# Patient Record
Sex: Female | Born: 1995
Health system: Southern US, Community
[De-identification: ages and names within clinical notes are randomized; demographics above are authoritative.]

## PROBLEM LIST (undated history)

## (undated) DIAGNOSIS — K5909 Other constipation: Secondary | ICD-10-CM

## (undated) DIAGNOSIS — G43909 Migraine, unspecified, not intractable, without status migrainosus: Secondary | ICD-10-CM

## (undated) DIAGNOSIS — R002 Palpitations: Secondary | ICD-10-CM

## (undated) DIAGNOSIS — K219 Gastro-esophageal reflux disease without esophagitis: Secondary | ICD-10-CM

## (undated) DIAGNOSIS — E282 Polycystic ovarian syndrome: Secondary | ICD-10-CM

## (undated) DIAGNOSIS — Z8619 Personal history of other infectious and parasitic diseases: Secondary | ICD-10-CM

## (undated) DIAGNOSIS — Z8249 Family history of ischemic heart disease and other diseases of the circulatory system: Secondary | ICD-10-CM

## (undated) DIAGNOSIS — K644 Residual hemorrhoidal skin tags: Secondary | ICD-10-CM

## (undated) DIAGNOSIS — K601 Chronic anal fissure: Secondary | ICD-10-CM

## (undated) DIAGNOSIS — L680 Hirsutism: Secondary | ICD-10-CM

## (undated) DIAGNOSIS — I959 Hypotension, unspecified: Secondary | ICD-10-CM

## (undated) DIAGNOSIS — Z8489 Family history of other specified conditions: Secondary | ICD-10-CM

## (undated) HISTORY — DX: Family history of ischemic heart disease and other diseases of the circulatory system: Z82.49

## (undated) HISTORY — DX: Other constipation: K59.09

## (undated) HISTORY — DX: Hypotension, unspecified: I95.9

## (undated) HISTORY — DX: Personal history of other infectious and parasitic diseases: Z86.19

## (undated) HISTORY — DX: Polycystic ovarian syndrome: E28.2

## (undated) HISTORY — PX: TONSILLECTOMY AND ADENOIDECTOMY: SHX28

## (undated) HISTORY — DX: Family history of other specified conditions: Z84.89

## (undated) HISTORY — PX: WISDOM TOOTH EXTRACTION: SHX21

## (undated) HISTORY — DX: Chronic anal fissure: K60.1

## (undated) HISTORY — DX: Palpitations: R00.2

## (undated) HISTORY — DX: Migraine, unspecified, not intractable, without status migrainosus: G43.909

## (undated) HISTORY — DX: Hirsutism: L68.0

## (undated) HISTORY — DX: Gastro-esophageal reflux disease without esophagitis: K21.9

## (undated) HISTORY — DX: Residual hemorrhoidal skin tags: K64.4

---

## 2015-01-16 ENCOUNTER — Emergency Department (HOSPITAL_COMMUNITY)
Admission: EM | Admit: 2015-01-16 | Discharge: 2015-01-16 | Disposition: A | Discharge status: Home / Self Care | Payer: Self-pay | Attending: Physician Assistant | Admitting: Physician Assistant

## 2015-01-16 ENCOUNTER — Emergency Department (HOSPITAL_COMMUNITY): Payer: Self-pay

## 2015-01-16 DIAGNOSIS — R110 Nausea: Secondary | ICD-10-CM | POA: Insufficient documentation

## 2015-01-16 DIAGNOSIS — R55 Syncope and collapse: Secondary | ICD-10-CM | POA: Insufficient documentation

## 2015-01-16 DIAGNOSIS — Z3202 Encounter for pregnancy test, result negative: Secondary | ICD-10-CM | POA: Insufficient documentation

## 2015-01-16 DIAGNOSIS — R002 Palpitations: Secondary | ICD-10-CM | POA: Insufficient documentation

## 2015-01-16 DIAGNOSIS — R0789 Other chest pain: Secondary | ICD-10-CM | POA: Insufficient documentation

## 2015-01-16 LAB — COMPREHENSIVE METABOLIC PANEL
ALBUMIN: 4 g/dL (ref 3.5–5.0)
ALK PHOS: 71 U/L (ref 38–126)
ALT: 13 U/L — ABNORMAL LOW (ref 14–54)
ANION GAP: 5 (ref 5–15)
AST: 20 U/L (ref 15–41)
BILIRUBIN TOTAL: 0.7 mg/dL (ref 0.3–1.2)
BUN: 9 mg/dL (ref 6–20)
CO2: 24 mmol/L (ref 22–32)
Calcium: 9.1 mg/dL (ref 8.9–10.3)
Chloride: 107 mmol/L (ref 101–111)
Creatinine, Ser: 0.8 mg/dL (ref 0.44–1.00)
GFR calc Af Amer: >60 mL/min (ref >60)
GFR calc non Af Amer: >60 mL/min (ref >60)
GLUCOSE: 94 mg/dL (ref 65–99)
POTASSIUM: 3.7 mmol/L (ref 3.5–5.1)
SODIUM: 136 mmol/L (ref 135–145)
Total Protein: 7 g/dL (ref 6.5–8.1)

## 2015-01-16 LAB — CBC WITH DIFFERENTIAL/PLATELET
BASOS ABS: 0 10*3/uL (ref 0.0–0.1)
BASOS PCT: 0 %
EOS ABS: 0 10*3/uL (ref 0.0–0.7)
Eosinophils Relative: 0 %
HEMATOCRIT: 36.8 % (ref 36.0–46.0)
HEMOGLOBIN: 12.5 g/dL (ref 12.0–15.0)
Lymphocytes Relative: 23 %
Lymphs Abs: 2 10*3/uL (ref 0.7–4.0)
MCH: 31.4 pg (ref 26.0–34.0)
MCHC: 34 g/dL (ref 30.0–36.0)
MCV: 92.5 fL (ref 78.0–100.0)
MONOS PCT: 7 %
Monocytes Absolute: 0.6 10*3/uL (ref 0.1–1.0)
NEUTROS ABS: 6.2 10*3/uL (ref 1.7–7.7)
NEUTROS PCT: 70 %
Platelets: 312 10*3/uL (ref 150–400)
RBC: 3.98 MIL/uL (ref 3.87–5.11)
RDW: 12.2 % (ref 11.5–15.5)
WBC: 8.8 10*3/uL (ref 4.0–10.5)

## 2015-01-16 LAB — URINALYSIS, ROUTINE W REFLEX MICROSCOPIC
Bilirubin Urine: NEGATIVE
GLUCOSE, UA: NEGATIVE mg/dL
Hgb urine dipstick: NEGATIVE
Ketones, ur: NEGATIVE mg/dL
LEUKOCYTES UA: NEGATIVE
NITRITE: NEGATIVE
PH: 8 (ref 5.0–8.0)
Protein, ur: NEGATIVE mg/dL
SPECIFIC GRAVITY, URINE: 1.015 (ref 1.005–1.030)

## 2015-01-16 LAB — HCG, SERUM, QUALITATIVE: Preg, Serum: NEGATIVE

## 2015-01-16 MED ORDER — SODIUM CHLORIDE 0.9 % IV BOLUS (SEPSIS)
1000.0000 mL | Freq: Once | INTRAVENOUS | Status: AC
  Administered 2015-01-16: 1000 mL via INTRAVENOUS

## 2015-01-16 NOTE — ED Provider Notes (Signed)
CSN: [id number]     Arrival date & time 01/16/15  1419 History   First MD Initiated Contact with Patient 01/16/15 1508     Chief Complaint  Patient presents with  . Dizziness     (Consider location/radiation/quality/duration/timing/severity/associated sxs/prior Treatment) HPI   Patient is a 19 year old female who was giving blood today. Afterwards she stood up and walked on the stairs at which point she felt lightheaded, dizzy, chest pain, shortness of breath, sweating, nausea. Patient reports that she had numbness and tingling in all 4 limbs. She also had shaking of her right arm. She felt faint. EMS took her blood pressure and reportedly it was 90/45. Patient felt better when she was in the rescue.  Patient has no past medical history. However she's. Anxious and concerned because her father and mother both had heart attacks in their 50s.  No past medical history on file. No past surgical history on file. No family history on file. Social History  Substance Use Topics  . Smoking status: Not on file  . Smokeless tobacco: Not on file  . Alcohol Use: Not on file   OB History    No data available     Review of Systems  Constitutional: Negative for activity change and fatigue.  Respiratory: Positive for chest tightness. Negative for cough.   Cardiovascular: Positive for chest pain and palpitations.  Gastrointestinal: Positive for nausea. Negative for abdominal distention.  Genitourinary: Negative for dysuria.  Skin: Negative for rash.  Allergic/Immunologic: Negative for immunocompromised state.  Neurological: Positive for dizziness and light-headedness.  Psychiatric/Behavioral: Negative for confusion.      Allergies  Review of patient's allergies indicates no known allergies.  Home Medications   Prior to Admission medications   Medication Sig Start Date End Date Taking? Authorizing Provider  ibuprofen (ADVIL,MOTRIN) 200 MG tablet Take 200 mg by mouth every 6 (six) hours  as needed for moderate pain.   Yes Historical Provider, MD   BP 136/86 mmHg  Pulse 70  Temp(Src) 97.8 F (36.6 C) (Oral)  Resp 32  SpO2 100%  LMP 01/02/2015 Physical Exam  Constitutional: She is oriented to person, place, and time. She appears well-developed and well-nourished.  HENT:  Head: Normocephalic and atraumatic.  Eyes: Conjunctivae are normal. Right eye exhibits no discharge.  Neck: Neck supple.  Cardiovascular: Normal rate, regular rhythm and normal heart sounds.   No murmur heard. Pulmonary/Chest: Effort normal and breath sounds normal. She has no wheezes. She has no rales.  Abdominal: Soft. She exhibits no distension. There is no tenderness.  Musculoskeletal: Normal range of motion. She exhibits no edema.  Neurological: She is oriented to person, place, and time. No cranial nerve deficit.  Skin: Skin is warm and dry. No rash noted. She is not diaphoretic.  Psychiatric: She has a normal mood and affect. Her behavior is normal.  Nursing note and vitals reviewed.   ED Course  Procedures (including critical care time) Labs Review Labs Reviewed  COMPREHENSIVE METABOLIC PANEL - Abnormal; Notable for the following:    ALT 13 (*)    All other components within normal limits  URINALYSIS, ROUTINE W REFLEX MICROSCOPIC (NOT AT [facility]) - Abnormal; Notable for the following:    APPearance HAZY (*)    All other components within normal limits  CBC WITH DIFFERENTIAL/PLATELET  HCG, SERUM, QUALITATIVE    Imaging Review Dg Chest 2 View  01/16/2015  CLINICAL DATA:  Left-sided chest pain on off. Dr. cough for 2 months. EXAM: CHEST  2  VIEW COMPARISON:  None. FINDINGS: Normal cardiac silhouette. Lungs are clear. No effusion, infiltrate, pneumothorax. Sigmoid scoliosis of the spine. IMPRESSION: 1.  No acute cardiopulmonary process. 2. Sigmoid scoliosis of the thoracolumbar spine. Electronically Signed   By: Genevive BiStewart  Edmunds M.D.   On: 01/16/2015 16:13   I have personally reviewed and  evaluated these images and lab results as part of my medical decision-making.   EKG Interpretation None      MDM   Final diagnoses:  Vasovagal episode    She is a 19 year old female sent in today after vasovagal symptom after giving blood. We will give fluids. Get baseline labs and EKG. However I think this sounds like a vasovagal episodes especially after getting blood.  Patient back to baseline, labs  and xray normal.   Aden Sek Randall AnLyn Brigg [place], MD 01/16/15 2319

## 2015-01-16 NOTE — ED Notes (Signed)
Bed: WA02 Expected date:  Expected time:  Means of arrival:  Comments: Ems- near syncopal episode/ anxiety

## 2015-01-16 NOTE — ED Notes (Signed)
[facility] without eating or drinking prior. After giving of blood, patient became dizzy. Patient is c/o Complaining of chest pain and states I am having a "panic attack".

## 2015-01-16 NOTE — Discharge Instructions (Signed)
Near-Syncope Near-syncope (commonly known as near fainting) is sudden weakness, dizziness, or feeling like you might pass out. During an episode of near-syncope, you may also develop pale skin, have tunnel vision, or feel sick to your stomach (nauseous). Near-syncope may occur when getting up after sitting or while standing for a long time. It is caused by a sudden decrease in blood flow to the brain. This decrease can result from various causes or triggers, most of which are not serious. However, because near-syncope can sometimes be a sign of something serious, a medical evaluation is required. The specific cause is often not determined. HOME CARE INSTRUCTIONS  Monitor your condition for any changes. The following actions may help to alleviate any discomfort you are experiencing:  Have someone stay with you until you feel stable.  Lie down right away and prop your feet up if you start feeling like you might faint. Breathe deeply and steadily. Wait until all the symptoms have passed. Most of these episodes last only a few minutes. You may feel tired for several hours.   Drink enough fluids to keep your urine clear or pale yellow.   If you are taking blood pressure or heart medicine, get up slowly when seated or lying down. Take several minutes to sit and then stand. This can reduce dizziness.  Follow up with your health care provider as directed. SEEK IMMEDIATE MEDICAL CARE IF:   You have a severe headache.   You have unusual pain in the chest, abdomen, or back.   You are bleeding from the mouth or rectum, or you have black or tarry stool.   You have an irregular or very fast heartbeat.   You have repeated fainting or have seizure-like jerking during an episode.   You faint when sitting or lying down.   You have confusion.   You have difficulty walking.   You have severe weakness.   You have vision problems.  MAKE SURE YOU:   Understand these instructions.  Will  watch your condition.  Will get help right away if you are not doing well or get worse.   This information is not intended to replace advice given to you by your health care provider. Make sure you discuss any questions you have with your health care provider.   Document Released: 02/16/2005 Document Revised: 02/21/2013 Document Reviewed: 07/22/2012 Elsevier Interactive Patient Education 2016 [employer]    Emergency Department Resource Guide 1) Find a Doctor and Pay Out of Pocket Although you won't have to find out who is covered by your insurance plan, it is a good idea to ask around and get recommendations. You will then need to call the office and see if the doctor you have chosen will accept you as a new patient and what types of options they offer for patients who are self-pay. Some doctors offer discounts or will set up payment plans for their patients who do not have insurance, but you will need to ask so you aren't surprised when you get to your appointment.  2) Contact Your Local Health Department Not all health departments have doctors that can see patients for sick visits, but many do, so it is worth a call to see if yours does. If you don't know where your local health department is, you can check in your phone book. The CDC also has a tool to help you locate your state's health department, and many state websites also have listings of all of their local health departments.  3) Find a [facility] If your illness is not likely to be very severe or complicated, you may want to try a walk in clinic. These are popping up all over the country in pharmacies, drugstores, and shopping centers. They're usually staffed by nurse practitioners or physician assistants that have been trained to treat common illnesses and complaints. They're usually fairly quick and inexpensive. However, if you have serious medical issues or chronic medical problems, these are probably not your best  option.  No Primary Care Doctor: - Call Health Connect at  [phone] - they can help you locate a primary care doctor that  accepts your insurance, provides certain services, etc. - Physician Referral Service- [phone]  Chronic Pain Problems: Organization         Address  Phone   Notes  [facility]  [phone] Patients need to be referred by their primary care doctor.   Medication Assistance: Organization         Address  Phone   Notes  [facility] Medication [facility] [address] [place], [place] [address] [phone] --Must be a resident of [facility] -- Must have NO insurance coverage whatsoever (no Medicaid/ Medicare, etc.) -- The pt. MUST have a primary care doctor that directs their care regularly and follows them in the community   [facility]ist  [phone]   [employer]  [phone]    Agencies that provide inexpensive medical care: Organization         Address  Phone   Notes  [place]  [phone]   [facility]    [phone]   [facility] [place],  [address] [phone]   [place] [address] [place] [phone]   Planned Parenthood    [phone]   [facility]    [phone]   [place] and [place] [address], [place] Phone:  [phone], Fax:  [phone] Hours of Operation:  9 am - 6 pm, M-F.  Also accepts Medicaid/Medicare and self-pay.  [facility] [place] [address], [address], [place] Phone: [phone], Fax: [phone]. Hours of Operation:  8:30 am - 5:30 pm, M-F.  Also accepts Medicaid and self-pay.  [facility] [place] [address] [place] Phone: [phone]   [place], [place], [place] [phone], Ext. [phone] Mondays & Thursdays: 7-9 AM.  First 15  patients are seen on a first come, first serve basis.    [place] Providers:  Organization         Address  Phone   Notes  [facility] [address], [place] [phone] Also accepts self-pay patients.  [facility] [address], [place]  [phone]   [place], [address], [place] [phone]   [facility] [address], [place] [phone]   Lucianne Lei [address], [place]   [phone] Only accepts [place] patients after they have their name applied to their card.   Self-Pay (no insurance) in [facility]:  Organization         Address  Phone   Notes  Sickle Cell Patients, [facility] [phone]  [address], [place] [phone]   °[facility] [address], [place] [phone]   °[facility] ° [address], [place] [phone]   °[facility]/Dr. Osei-Bonsu ° [address] [place] [address], [place] or [address], [place] [phone] Phone number for both [place] and [place] locations is the same.  °[facility] [address], [place] [phone]   °[facility] [address] [place] [address], [place] or [address] [phone] °([phone]   °[facility] [address], [place] [phone], phone; [phone], fax Sees patients 1st and 3rd Saturday of every month.  Must not qualify for public or private insurance (i.e. Medicaid, Medicare, [place] Health Choice, Veterans' Benefits) • Household income should be no more than 200% of the poverty level •The clinic cannot treat you if you are pregnant or think you are pregnant • Sexually transmitted diseases are not treated at the clinic.  ° ° °Dental  Care: °Organization         Address  Phone  Notes  °[facility] [address], [place] [phone] Accepts children up to age 21 who are enrolled in Medicaid or [place] Health Choice; pregnant women with a Medicaid card; and children who have applied for Medicaid or [place] Health Choice, but were declined, whose parents can pay a reduced fee at time of service.  °[facility] [place]  [address], [place] [phone]) [phone] Accepts children up to age 21 who are enrolled in Medicaid or [place] Health Choice; pregnant women with a Medicaid card; and children who have applied for Medicaid or [place] Health Choice, but were declined, whose parents can pay a reduced fee at time of service.  °[facility] ° [address], [place] [phone] Patients are seen by appointment only. Walk-ins are not accepted. [facility] will see patients 18 years of age and older. °Monday - Tuesday (8am-5pm) °Most Wednesdays (8:30-5pm) °$30 per visit, cash only  °[facility] ° [address], [place] [phone] Patients are seen by appointment only. Walk-ins are not accepted. [facility] will see patients 18 years of age and older. °One Wednesday Evening (Monthly: Volunteer Based).  $30 per visit, cash only  °[facility]  [phone] for adults; Children under age 4, call Graduate Pediatric Dentistry at [phone]. Children aged 4-14, please call [phone] to request a pediatric application. ° Dental services are provided in all areas of dental care including fillings, crowns and bridges, complete and partial dentures, implants, gum treatment, root canals, and extractions. Preventive care is also provided. Treatment is provided to both adults and children. °Patients are selected via a lottery and there is often a waiting list. °  °[facility] [address], °[place] ° ([phone] [email or web address] °  °[facility] [address], [place], [place] [phone], Ext. [phone] Second and Fourth Thursday of each month, opens at 6:30 AM; Clinic ends at 9 AM.  Patients are seen on a first-come first-served basis, and a limited number are seen during each clinic.  ° °[facility] ° [address], [place], [place] [phone]   Eligibility Requirements °You must have lived in [place],   [place], or [place] counties for at least the last three months.   You cannot be eligible for state or federal sponsored [employer], including [employer], [place], or [employer].   You generally cannot be eligible for healthcare insurance through your employer.    How to apply: Eligibility screenings are held every Tuesday and Wednesday afternoon from 1:00 pm until 4:00 pm. You do not need an appointment for the interview!  [facility] [address] [place], [place]   [place]  [place] Department  [place]  [phone]    Behavioral Health Resources in the Community: Intensive Outpatient Programs Organization         Address  Phone  Notes  [place]. [address] [place], [place] [phone]   [facility] Outpatient [address], [place], [place]   ADS: Alcohol & Drug Svcs [address] [place], [place]   [place] [address],  [place], [place] or [phone]   Substance Abuse Resources Organization         Address  Phone  Notes  [facility] Drug Services  [phone]   [place]  [phone]   [facility]   Chinita Pester  [phone]   Residential & Outpatient Substance Abuse Program  [phone]    Psychological Services Organization         Address  Phone  Notes  [facility] [place]  [place]  [phone]   [place] [address] [place] or [phone]    Mobile Crisis Teams Organization         Address  Phone  Notes  [facility], Mobile Crisis Care Unit  [phone]   [facility]  [address] [place], [place]   [place] [address] [place] [phone]    Self-Help/Support Groups Organization         Address  Phone             Notes  [place]. of [place] - variety of support groups  [place] Call for more information  Narcotics Anonymous (NA), [facility] [address], [employer] [place]  2 meetings at this location   Special educational needs teacher         Address  Phone  Notes  [facility] [place],    [place]  [phone]   [facility]  [address] [place] [address]28, [place], [place]   [facility], [place] [phone] Admissions: 8am-3pm M-F  Incentives Substance [place] [address],    [place], [place] [phone]   [facility] [address] Jadene Pierini [place], [place]   [facility] [address]  [place], [place]   [facility] - Intensive Outpatient [address], [place], [place]   [facility] [place].,  [place], [place] or [phone]   [facility] ([facility]) [address], [place], [place] Accepts Medicaid  [facility] [address]  [place] [phone] Substance Abuse/Addiction Treatment   [facility] Resources Organization         Address  Phone  Notes  [facility]  Services  [phone]   Domenic Schwab, PhD [address] Arlis [place], [place]   [phone] or [phone]   [facility] [place], [place] [phone]   [address], [place], [place] [phone] Insurance/Medicaid/sponsorship through [facility] and Families [address] [place]                                    [place], [place] [phone] [place] [address], [place] [phone]    Dr. Adele Schilder  [phone]   [facility] Dept. 1) [address] [place] 2) [place] 3)  [address], [place] [phone]  [phone]   [place] [phone] or [phone] (After Hours)

## 2015-05-31 ENCOUNTER — Telehealth: Payer: Self-pay | Admitting: *Deleted

## 2015-05-31 NOTE — Telephone Encounter (Signed)
Unable to reach patient at time of pre-visit call. Left message for patient to return call when available.  

## 2015-06-03 ENCOUNTER — Encounter: Payer: Self-pay | Admitting: *Deleted

## 2015-06-03 ENCOUNTER — Ambulatory Visit (INDEPENDENT_AMBULATORY_CARE_PROVIDER_SITE_OTHER): Payer: 59 | Admitting: Physician Assistant

## 2015-06-03 ENCOUNTER — Encounter: Payer: Self-pay | Admitting: Physician Assistant

## 2015-06-03 VITALS — BP 90/58 | HR 88 | Temp 97.9°F | Ht 66.0 in | Wt 163.0 lb

## 2015-06-03 DIAGNOSIS — G8929 Other chronic pain: Secondary | ICD-10-CM | POA: Insufficient documentation

## 2015-06-03 DIAGNOSIS — Z0001 Encounter for general adult medical examination with abnormal findings: Secondary | ICD-10-CM

## 2015-06-03 DIAGNOSIS — Z0000 Encounter for general adult medical examination without abnormal findings: Secondary | ICD-10-CM | POA: Diagnosis not present

## 2015-06-03 DIAGNOSIS — Z8249 Family history of ischemic heart disease and other diseases of the circulatory system: Secondary | ICD-10-CM | POA: Diagnosis not present

## 2015-06-03 DIAGNOSIS — R42 Dizziness and giddiness: Secondary | ICD-10-CM | POA: Diagnosis not present

## 2015-06-03 DIAGNOSIS — Z114 Encounter for screening for human immunodeficiency virus [HIV]: Secondary | ICD-10-CM | POA: Diagnosis not present

## 2015-06-03 DIAGNOSIS — Z1159 Encounter for screening for other viral diseases: Secondary | ICD-10-CM | POA: Diagnosis not present

## 2015-06-03 DIAGNOSIS — Z118 Encounter for screening for other infectious and parasitic diseases: Secondary | ICD-10-CM | POA: Diagnosis not present

## 2015-06-03 DIAGNOSIS — Z01419 Encounter for gynecological examination (general) (routine) without abnormal findings: Secondary | ICD-10-CM | POA: Diagnosis not present

## 2015-06-03 DIAGNOSIS — M25569 Pain in unspecified knee: Secondary | ICD-10-CM

## 2015-06-03 DIAGNOSIS — Z6827 Body mass index (BMI) 27.0-27.9, adult: Secondary | ICD-10-CM | POA: Diagnosis not present

## 2015-06-03 DIAGNOSIS — Z113 Encounter for screening for infections with a predominantly sexual mode of transmission: Secondary | ICD-10-CM | POA: Diagnosis not present

## 2015-06-03 LAB — CBC
HEMATOCRIT: 37.8 % (ref 36.0–46.0)
HEMOGLOBIN: 12.6 g/dL (ref 12.0–15.0)
MCHC: 33.5 g/dL (ref 30.0–36.0)
MCV: 90.9 fl (ref 78.0–100.0)
PLATELETS: 364 10*3/uL (ref 150.0–400.0)
RBC: 4.15 Mil/uL (ref 3.87–5.11)
RDW: 14 % (ref 11.5–15.5)
WBC: 9.7 10*3/uL (ref 4.0–10.5)

## 2015-06-03 LAB — HM PAP SMEAR

## 2015-06-03 LAB — URINALYSIS, ROUTINE W REFLEX MICROSCOPIC
Bilirubin Urine: NEGATIVE
Hgb urine dipstick: NEGATIVE
Ketones, ur: NEGATIVE
Leukocytes, UA: NEGATIVE
Nitrite: NEGATIVE
SPECIFIC GRAVITY, URINE: 1.01 (ref 1.000–1.030)
TOTAL PROTEIN, URINE-UPE24: NEGATIVE
URINE GLUCOSE: NEGATIVE
UROBILINOGEN UA: 0.2 (ref 0.0–1.0)
pH: 6 (ref 5.0–8.0)

## 2015-06-03 LAB — COMPREHENSIVE METABOLIC PANEL
ALK PHOS: 56 U/L (ref 39–117)
ALT: 12 U/L (ref 0–35)
AST: 15 U/L (ref 0–37)
Albumin: 4.4 g/dL (ref 3.5–5.2)
BILIRUBIN TOTAL: 0.2 mg/dL (ref 0.2–1.2)
BUN: 11 mg/dL (ref 6–23)
CO2: 30 meq/L (ref 19–32)
Calcium: 9.8 mg/dL (ref 8.4–10.5)
Chloride: 103 mEq/L (ref 96–112)
Creatinine, Ser: 0.69 mg/dL (ref 0.40–1.20)
GFR: 113.57 mL/min (ref >60.00)
Glucose, Bld: 82 mg/dL (ref 70–99)
POTASSIUM: 3.9 meq/L (ref 3.5–5.1)
Sodium: 138 mEq/L (ref 135–145)
Total Protein: 7.2 g/dL (ref 6.0–8.3)

## 2015-06-03 LAB — LIPID PANEL
CHOL/HDL RATIO: 3
Cholesterol: 153 mg/dL (ref 0–200)
HDL: 51.5 mg/dL (ref >39.00)
LDL CALC: 91 mg/dL (ref 0–99)
NONHDL: 101.97
Triglycerides: 55 mg/dL (ref 0.0–149.0)
VLDL: 11 mg/dL (ref 0.0–40.0)

## 2015-06-03 LAB — TSH: TSH: 2.08 u[IU]/mL (ref 0.35–4.50)

## 2015-06-03 MED ORDER — METHOCARBAMOL 500 MG PO TABS: 500.0000 mg | ORAL_TABLET | Freq: Three times a day (TID) | ORAL | Status: DC | PRN

## 2015-06-03 MED ORDER — DICLOFENAC SODIUM 1 % TD GEL: 2.0000 g | Freq: Four times a day (QID) | TRANSDERMAL | Status: DC

## 2015-06-03 NOTE — Assessment & Plan Note (Signed)
With back pain. Discussed benefit of PT. Patient will give some thought. Rx Robaxin to take at bedtime. Rx Voltaren to use as directed.

## 2015-06-03 NOTE — Patient Instructions (Signed)
Please go to the lab for blood work. I will call with your results.  Please take Robaxin as directed at bedtime.  Use Voltaren gel as directed. Consider physical therapy to help strengthen core muscles to help with pain.  Follow-up will be based on your lab results.  Preventive Care for Adults, Female A healthy lifestyle and preventive care can promote health and wellness. Preventive health guidelines for women include the following key practices.  A routine yearly physical is a good way to check with your health care provider about your health and preventive screening. It is a chance to share any concerns and updates on your health and to receive a thorough exam.  Visit your dentist for a routine exam and preventive care every 6 months. Brush your teeth twice a day and floss once a day. Good oral hygiene prevents tooth decay and gum disease.  The frequency of eye exams is based on your age, health, family medical history, use of contact lenses, and other factors. Follow your health care provider's recommendations for frequency of eye exams.  Eat a healthy diet. Foods like vegetables, fruits, whole grains, low-fat dairy products, and lean protein foods contain the nutrients you need without too many calories. Decrease your intake of foods high in solid fats, added sugars, and salt. Eat the right amount of calories for you.Get information about a proper diet from your health care provider, if necessary.  Regular physical exercise is one of the most important things you can do for your health. Most adults should get at least 150 minutes of moderate-intensity exercise (any activity that increases your heart rate and causes you to sweat) each week. In addition, most adults need muscle-strengthening exercises on 2 or more days a week.  Maintain a healthy weight. The body mass index (BMI) is a screening tool to identify possible weight problems. It provides an estimate of body fat based on height  and weight. Your health care provider can find your BMI and can help you achieve or maintain a healthy weight.For adults 20 years and older:  A BMI below 18.5 is considered underweight.  A BMI of 18.5 to 24.9 is normal.  A BMI of 25 to 29.9 is considered overweight.  A BMI of 30 and above is considered obese.  Maintain normal blood lipids and cholesterol levels by exercising and minimizing your intake of saturated fat. Eat a balanced diet with plenty of fruit and vegetables. Blood tests for lipids and cholesterol should begin at age 69 and be repeated every 5 years. If your lipid or cholesterol levels are high, you are over 50, or you are at high risk for heart disease, you may need your cholesterol levels checked more frequently.Ongoing high lipid and cholesterol levels should be treated with medicines if diet and exercise are not working.  If you smoke, find out from your health care provider how to quit. If you do not use tobacco, do not start.  Lung cancer screening is recommended for adults aged 67-80 years who are at high risk for developing lung cancer because of a history of smoking. A yearly low-dose CT scan of the lungs is recommended for people who have at least a 30-pack-year history of smoking and are a current smoker or have quit within the past 15 years. A pack year of smoking is smoking an average of 1 pack of cigarettes a day for 1 year (for example: 1 pack a day for 30 years or 2 packs a day for  15 years). Yearly screening should continue until the smoker has stopped smoking for at least 15 years. Yearly screening should be stopped for people who develop a health problem that would prevent them from having lung cancer treatment.  If you are pregnant, do not drink alcohol. If you are breastfeeding, be very cautious about drinking alcohol. If you are not pregnant and choose to drink alcohol, do not have more than 1 drink per day. One drink is considered to be 12 ounces (355 mL) of  beer, 5 ounces (148 mL) of wine, or 1.5 ounces (44 mL) of liquor.  Avoid use of street drugs. Do not share needles with anyone. Ask for help if you need support or instructions about stopping the use of drugs.  High blood pressure causes heart disease and increases the risk of stroke. Your blood pressure should be checked at least every 1 to 2 years. Ongoing high blood pressure should be treated with medicines if weight loss and exercise do not work.  If you are 22-41 years old, ask your health care provider if you should take aspirin to prevent strokes.  Diabetes screening is done by taking a blood sample to check your blood glucose level after you have not eaten for a certain period of time (fasting). If you are not overweight and you do not have risk factors for diabetes, you should be screened once every 3 years starting at age 21. If you are overweight or obese and you are 67-32 years of age, you should be screened for diabetes every year as part of your cardiovascular risk assessment.  Breast cancer screening is essential preventive care for women. You should practice "breast self-awareness." This means understanding the normal appearance and feel of your breasts and may include breast self-examination. Any changes detected, no matter how small, should be reported to a health care provider. Women in their 69s and 30s should have a clinical breast exam (CBE) by a health care provider as part of a regular health exam every 1 to 3 years. After age 63, women should have a CBE every year. Starting at age 53, women should consider having a mammogram (breast X-ray test) every year. Women who have a family history of breast cancer should talk to their health care provider about genetic screening. Women at a high risk of breast cancer should talk to their health care providers about having an MRI and a mammogram every year.  Breast cancer gene (BRCA)-related cancer risk assessment is recommended for women  who have family members with BRCA-related cancers. BRCA-related cancers include breast, ovarian, tubal, and peritoneal cancers. Having family members with these cancers may be associated with an increased risk for harmful changes (mutations) in the breast cancer genes BRCA1 and BRCA2. Results of the assessment will determine the need for genetic counseling and BRCA1 and BRCA2 testing.  Your health care provider may recommend that you be screened regularly for cancer of the pelvic organs (ovaries, uterus, and vagina). This screening involves a pelvic examination, including checking for microscopic changes to the surface of your cervix (Pap test). You may be encouraged to have this screening done every 3 years, beginning at age 46.  For women ages 32-65, health care providers may recommend pelvic exams and Pap testing every 3 years, or they may recommend the Pap and pelvic exam, combined with testing for human papilloma virus (HPV), every 5 years. Some types of HPV increase your risk of cervical cancer. Testing for HPV may also be done  on women of any age with unclear Pap test results.  Other health care providers may not recommend any screening for nonpregnant women who are considered low risk for pelvic cancer and who do not have symptoms. Ask your health care provider if a screening pelvic exam is right for you.  If you have had past treatment for cervical cancer or a condition that could lead to cancer, you need Pap tests and screening for cancer for at least 20 years after your treatment. If Pap tests have been discontinued, your risk factors (such as having a new sexual partner) need to be reassessed to determine if screening should resume. Some women have medical problems that increase the chance of getting cervical cancer. In these cases, your health care provider may recommend more frequent screening and Pap tests.  Colorectal cancer can be detected and often prevented. Most routine colorectal  cancer screening begins at the age of 77 years and continues through age 22 years. However, your health care provider may recommend screening at an earlier age if you have risk factors for colon cancer. On a yearly basis, your health care provider may provide home test kits to check for hidden blood in the stool. Use of a small camera at the end of a tube, to directly examine the colon (sigmoidoscopy or colonoscopy), can detect the earliest forms of colorectal cancer. Talk to your health care provider about this at age 72, when routine screening begins. Direct exam of the colon should be repeated every 5-10 years through age 23 years, unless early forms of precancerous polyps or small growths are found.  People who are at an increased risk for hepatitis B should be screened for this virus. You are considered at high risk for hepatitis B if:  You were born in a country where hepatitis B occurs often. Talk with your health care provider about which countries are considered high risk.  Your parents were born in a high-risk country and you have not received a shot to protect against hepatitis B (hepatitis B vaccine).  You have HIV or AIDS.  You use needles to inject street drugs.  You live with, or have sex with, someone who has hepatitis B.  You get hemodialysis treatment.  You take certain medicines for conditions like cancer, organ transplantation, and autoimmune conditions.  Hepatitis C blood testing is recommended for all people born from [birth year] through 1965 and any individual with known risks for hepatitis C.  Practice safe sex. Use condoms and avoid high-risk sexual practices to reduce the spread of sexually transmitted infections (STIs). STIs include gonorrhea, chlamydia, syphilis, trichomonas, herpes, HPV, and human immunodeficiency virus (HIV). Herpes, HIV, and HPV are viral illnesses that have no cure. They can result in disability, cancer, and death.  You should be screened for sexually  transmitted illnesses (STIs) including gonorrhea and chlamydia if:  You are sexually active and are younger than 24 years.  You are older than 24 years and your health care provider tells you that you are at risk for this type of infection.  Your sexual activity has changed since you were last screened and you are at an increased risk for chlamydia or gonorrhea. Ask your health care provider if you are at risk.  If you are at risk of being infected with HIV, it is recommended that you take a prescription medicine daily to prevent HIV infection. This is called preexposure prophylaxis (PrEP). You are considered at risk if:  You are sexually active and  do not regularly use condoms or know the HIV status of your partner(s).  You take drugs by injection.  You are sexually active with a partner who has HIV.  Talk with your health care provider about whether you are at high risk of being infected with HIV. If you choose to begin PrEP, you should first be tested for HIV. You should then be tested every 3 months for as long as you are taking PrEP.  Osteoporosis is a disease in which the bones lose minerals and strength with aging. This can result in serious bone fractures or breaks. The risk of osteoporosis can be identified using a bone density scan. Women ages 75 years and over and women at risk for fractures or osteoporosis should discuss screening with their health care providers. Ask your health care provider whether you should take a calcium supplement or vitamin D to reduce the rate of osteoporosis.  Menopause can be associated with physical symptoms and risks. Hormone replacement therapy is available to decrease symptoms and risks. You should talk to your health care provider about whether hormone replacement therapy is right for you.  Use sunscreen. Apply sunscreen liberally and repeatedly throughout the day. You should seek shade when your shadow is shorter than you. Protect yourself by  wearing long sleeves, pants, a wide-brimmed hat, and sunglasses year round, whenever you are outdoors.  Once a month, do a whole body skin exam, using a mirror to look at the skin on your back. Tell your health care provider of new moles, moles that have irregular borders, moles that are larger than a pencil eraser, or moles that have changed in shape or color.  Stay current with required vaccines (immunizations).  Influenza vaccine. All adults should be immunized every year.  Tetanus, diphtheria, and acellular pertussis (Td, Tdap) vaccine. Pregnant women should receive 1 dose of Tdap vaccine during each pregnancy. The dose should be obtained regardless of the length of time since the last dose. Immunization is preferred during the 27th-36th week of gestation. An adult who has not previously received Tdap or who does not know her vaccine status should receive 1 dose of Tdap. This initial dose should be followed by tetanus and diphtheria toxoids (Td) booster doses every 10 years. Adults with an unknown or incomplete history of completing a 3-dose immunization series with Td-containing vaccines should begin or complete a primary immunization series including a Tdap dose. Adults should receive a Td booster every 10 years.  Varicella vaccine. An adult without evidence of immunity to varicella should receive 2 doses or a second dose if she has previously received 1 dose. Pregnant females who do not have evidence of immunity should receive the first dose after pregnancy. This first dose should be obtained before leaving the health care facility. The second dose should be obtained 4-8 weeks after the first dose.  Human papillomavirus (HPV) vaccine. Females aged 13-26 years who have not received the vaccine previously should obtain the 3-dose series. The vaccine is not recommended for use in pregnant females. However, pregnancy testing is not needed before receiving a dose. If a female is found to be pregnant  after receiving a dose, no treatment is needed. In that case, the remaining doses should be delayed until after the pregnancy. Immunization is recommended for any person with an immunocompromised condition through the age of 26 years if she did not get any or all doses earlier. During the 3-dose series, the second dose should be obtained 4-8 weeks after  the first dose. The third dose should be obtained 24 weeks after the first dose and 16 weeks after the second dose.  Zoster vaccine. One dose is recommended for adults aged 71 years or older unless certain conditions are present.  Measles, mumps, and rubella (MMR) vaccine. Adults born before 35 generally are considered immune to measles and mumps. Adults born in [birth year] or later should have 1 or more doses of MMR vaccine unless there is a contraindication to the vaccine or there is laboratory evidence of immunity to each of the three diseases. A routine second dose of MMR vaccine should be obtained at least 28 days after the first dose for students attending postsecondary schools, health care workers, or international travelers. People who received inactivated measles vaccine or an unknown type of measles vaccine during 1963-1967 should receive 2 doses of MMR vaccine. People who received inactivated mumps vaccine or an unknown type of mumps vaccine before 1979 and are at high risk for mumps infection should consider immunization with 2 doses of MMR vaccine. For females of childbearing age, rubella immunity should be determined. If there is no evidence of immunity, females who are not pregnant should be vaccinated. If there is no evidence of immunity, females who are pregnant should delay immunization until after pregnancy. Unvaccinated health care workers born before [birth year] who lack laboratory evidence of measles, mumps, or rubella immunity or laboratory confirmation of disease should consider measles and mumps immunization with 2 doses of MMR vaccine or rubella  immunization with 1 dose of MMR vaccine.  Pneumococcal 13-valent conjugate (PCV13) vaccine. When indicated, a person who is uncertain of his immunization history and has no record of immunization should receive the PCV13 vaccine. All adults 7 years of age and older should receive this vaccine. An adult aged 26 years or older who has certain medical conditions and has not been previously immunized should receive 1 dose of PCV13 vaccine. This PCV13 should be followed with a dose of pneumococcal polysaccharide (PPSV23) vaccine. Adults who are at high risk for pneumococcal disease should obtain the PPSV23 vaccine at least 8 weeks after the dose of PCV13 vaccine. Adults older than 22 years of age who have normal immune system function should obtain the PPSV23 vaccine dose at least 1 year after the dose of PCV13 vaccine.  Pneumococcal polysaccharide (PPSV23) vaccine. When PCV13 is also indicated, PCV13 should be obtained first. All adults aged 46 years and older should be immunized. An adult younger than age 9 years who has certain medical conditions should be immunized. Any person who resides in a nursing home or long-term care facility should be immunized. An adult smoker should be immunized. People with an immunocompromised condition and certain other conditions should receive both PCV13 and PPSV23 vaccines. People with human immunodeficiency virus (HIV) infection should be immunized as soon as possible after diagnosis. Immunization during chemotherapy or radiation therapy should be avoided. Routine use of PPSV23 vaccine is not recommended for American Indians, [place] Natives, or people younger than 65 years unless there are medical conditions that require PPSV23 vaccine. When indicated, people who have unknown immunization and have no record of immunization should receive PPSV23 vaccine. One-time revaccination 5 years after the first dose of PPSV23 is recommended for people aged 19-64 years who have chronic  kidney failure, nephrotic syndrome, asplenia, or immunocompromised conditions. People who received 1-2 doses of PPSV23 before age 13 years should receive another dose of PPSV23 vaccine at age 50 years or later if at least 2  years have passed since the previous dose. Doses of PPSV23 are not needed for people immunized with PPSV23 at or after age 56 years.  Meningococcal vaccine. Adults with asplenia or persistent complement component deficiencies should receive 2 doses of quadrivalent meningococcal conjugate (MenACWY-D) vaccine. The doses should be obtained at least 2 months apart. Microbiologists working with certain meningococcal bacteria, [employer] recruits, people at risk during an outbreak, and people who travel to or live in countries with a high rate of meningitis should be immunized. A first-year college student up through age 27 years who is living in a residence hall should receive a dose if she did not receive a dose on or after her 16th birthday. Adults who have certain high-risk conditions should receive one or more doses of vaccine.  Hepatitis A vaccine. Adults who wish to be protected from this disease, have certain high-risk conditions, work with hepatitis A-infected animals, work in hepatitis A research labs, or travel to or work in countries with a high rate of hepatitis A should be immunized. Adults who were previously unvaccinated and who anticipate close contact with an international adoptee during the first 60 days after arrival in the [place] from a country with a high rate of hepatitis A should be immunized.  Hepatitis B vaccine. Adults who wish to be protected from this disease, have certain high-risk conditions, may be exposed to blood or other infectious body fluids, are household contacts or sex partners of hepatitis B positive people, are clients or workers in certain care facilities, or travel to or work in countries with a high rate of hepatitis B should be  immunized.  Haemophilus influenzae type b (Hib) vaccine. A previously unvaccinated person with asplenia or sickle cell disease or having a scheduled splenectomy should receive 1 dose of Hib vaccine. Regardless of previous immunization, a recipient of a hematopoietic stem cell transplant should receive a 3-dose series 6-12 months after her successful transplant. Hib vaccine is not recommended for adults with HIV infection. Preventive Services / Frequency Ages 46 to 68 years  Blood pressure check.** / Every 3-5 years.  Lipid and cholesterol check.** / Every 5 years beginning at age 51.  Clinical breast exam.** / Every 3 years for women in their 72s and 65s.  BRCA-related cancer risk assessment.** / For women who have family members with a BRCA-related cancer (breast, ovarian, tubal, or peritoneal cancers).  Pap test.** / Every 2 years from ages 66 through 31. Every 3 years starting at age 46 through age 45 or 80 with a history of 3 consecutive normal Pap tests.  HPV screening.** / Every 3 years from ages 38 through ages 69 to 42 with a history of 3 consecutive normal Pap tests.  Hepatitis C blood test.** / For any individual with known risks for hepatitis C.  Skin self-exam. / Monthly.  Influenza vaccine. / Every year.  Tetanus, diphtheria, and acellular pertussis (Tdap, Td) vaccine.** / Consult your health care provider. Pregnant women should receive 1 dose of Tdap vaccine during each pregnancy. 1 dose of Td every 10 years.  Varicella vaccine.** / Consult your health care provider. Pregnant females who do not have evidence of immunity should receive the first dose after pregnancy.  HPV vaccine. / 3 doses over 6 months, if 36 and younger. The vaccine is not recommended for use in pregnant females. However, pregnancy testing is not needed before receiving a dose.  Measles, mumps, rubella (MMR) vaccine.** / You need at least 1 dose of MMR  if you were born in 1957 or later. You may also need  a 2nd dose. For females of childbearing age, rubella immunity should be determined. If there is no evidence of immunity, females who are not pregnant should be vaccinated. If there is no evidence of immunity, females who are pregnant should delay immunization until after pregnancy.  Pneumococcal 13-valent conjugate (PCV13) vaccine.** / Consult your health care provider.  Pneumococcal polysaccharide (PPSV23) vaccine.** / 1 to 2 doses if you smoke cigarettes or if you have certain conditions.  Meningococcal vaccine.** / 1 dose if you are age 37 to 74 years and a Market researcher living in a residence hall, or have one of several medical conditions, you need to get vaccinated against meningococcal disease. You may also need additional booster doses.  Hepatitis A vaccine.** / Consult your health care provider.  Hepatitis B vaccine.** / Consult your health care provider.  Haemophilus influenzae type b (Hib) vaccine.** / Consult your health care provider. Ages 63 to 36 years  Blood pressure check.** / Every year.  Lipid and cholesterol check.** / Every 5 years beginning at age 46 years.  Lung cancer screening. / Every year if you are aged 67-80 years and have a 30-pack-year history of smoking and currently smoke or have quit within the past 15 years. Yearly screening is stopped once you have quit smoking for at least 15 years or develop a health problem that would prevent you from having lung cancer treatment.  Clinical breast exam.** / Every year after age 67 years.  BRCA-related cancer risk assessment.** / For women who have family members with a BRCA-related cancer (breast, ovarian, tubal, or peritoneal cancers).  Mammogram.** / Every year beginning at age 35 years and continuing for as long as you are in good health. Consult with your health care provider.  Pap test.** / Every 3 years starting at age 32 years through age 62 or 65 years with a history of 3 consecutive normal Pap  tests.  HPV screening.** / Every 3 years from ages 88 years through ages 73 to 84 years with a history of 3 consecutive normal Pap tests.  Fecal occult blood test (FOBT) of stool. / Every year beginning at age 7 years and continuing until age 49 years. You may not need to do this test if you get a colonoscopy every 10 years.  Flexible sigmoidoscopy or colonoscopy.** / Every 5 years for a flexible sigmoidoscopy or every 10 years for a colonoscopy beginning at age 14 years and continuing until age 52 years.  Hepatitis C blood test.** / For all people born from [birth year] through 1967 and any individual with known risks for hepatitis C.  Skin self-exam. / Monthly.  Influenza vaccine. / Every year.  Tetanus, diphtheria, and acellular pertussis (Tdap/Td) vaccine.** / Consult your health care provider. Pregnant women should receive 1 dose of Tdap vaccine during each pregnancy. 1 dose of Td every 10 years.  Varicella vaccine.** / Consult your health care provider. Pregnant females who do not have evidence of immunity should receive the first dose after pregnancy.  Zoster vaccine.** / 1 dose for adults aged 56 years or older.  Measles, mumps, rubella (MMR) vaccine.** / You need at least 1 dose of MMR if you were born in 1957 or later. You may also need a second dose. For females of childbearing age, rubella immunity should be determined. If there is no evidence of immunity, females who are not pregnant should be vaccinated. If there is  no evidence of immunity, females who are pregnant should delay immunization until after pregnancy.  Pneumococcal 13-valent conjugate (PCV13) vaccine.** / Consult your health care provider.  Pneumococcal polysaccharide (PPSV23) vaccine.** / 1 to 2 doses if you smoke cigarettes or if you have certain conditions.  Meningococcal vaccine.** / Consult your health care provider.  Hepatitis A vaccine.** / Consult your health care provider.  Hepatitis B vaccine.** / Consult  your health care provider.  Haemophilus influenzae type b (Hib) vaccine.** / Consult your health care provider. Ages 30 years and over  Blood pressure check.** / Every year.  Lipid and cholesterol check.** / Every 5 years beginning at age 64 years.  Lung cancer screening. / Every year if you are aged 41-80 years and have a 30-pack-year history of smoking and currently smoke or have quit within the past 15 years. Yearly screening is stopped once you have quit smoking for at least 15 years or develop a health problem that would prevent you from having lung cancer treatment.  Clinical breast exam.** / Every year after age 56 years.  BRCA-related cancer risk assessment.** / For women who have family members with a BRCA-related cancer (breast, ovarian, tubal, or peritoneal cancers).  Mammogram.** / Every year beginning at age 43 years and continuing for as long as you are in good health. Consult with your health care provider.  Pap test.** / Every 3 years starting at age 67 years through age 41 or 32 years with 3 consecutive normal Pap tests. Testing can be stopped between 65 and 70 years with 3 consecutive normal Pap tests and no abnormal Pap or HPV tests in the past 10 years.  HPV screening.** / Every 3 years from ages 46 years through ages 29 or 6 years with a history of 3 consecutive normal Pap tests. Testing can be stopped between 65 and 70 years with 3 consecutive normal Pap tests and no abnormal Pap or HPV tests in the past 10 years.  Fecal occult blood test (FOBT) of stool. / Every year beginning at age 27 years and continuing until age 4 years. You may not need to do this test if you get a colonoscopy every 10 years.  Flexible sigmoidoscopy or colonoscopy.** / Every 5 years for a flexible sigmoidoscopy or every 10 years for a colonoscopy beginning at age 24 years and continuing until age 82 years.  Hepatitis C blood test.** / For all people born from [birth year] through 1967 and any  individual with known risks for hepatitis C.  Osteoporosis screening.** / A one-time screening for women ages 62 years and over and women at risk for fractures or osteoporosis.  Skin self-exam. / Monthly.  Influenza vaccine. / Every year.  Tetanus, diphtheria, and acellular pertussis (Tdap/Td) vaccine.** / 1 dose of Td every 10 years.  Varicella vaccine.** / Consult your health care provider.  Zoster vaccine.** / 1 dose for adults aged 54 years or older.  Pneumococcal 13-valent conjugate (PCV13) vaccine.** / Consult your health care provider.  Pneumococcal polysaccharide (PPSV23) vaccine.** / 1 dose for all adults aged 70 years and older.  Meningococcal vaccine.** / Consult your health care provider.  Hepatitis A vaccine.** / Consult your health care provider.  Hepatitis B vaccine.** / Consult your health care provider.  Haemophilus influenzae type b (Hib) vaccine.** / Consult your health care provider. ** Family history and personal history of risk and conditions may change your health care provider's recommendations.   This information is not intended to replace advice given  to you by your health care provider. Make sure you discuss any questions you have with your health care provider.   Document Released: 04/14/2001 Document Revised: 03/09/2014 Document Reviewed: 07/14/2010 Elsevier Interactive Patient Education [employer].

## 2015-06-03 NOTE — Assessment & Plan Note (Signed)
BP mildly hypotensive. Patient asymptomatic. EKG with NSR. Will obtain lab panel today. She is to increase hydration and add on a Gatorade daily.
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
## 2015-06-03 NOTE — Progress Notes (Signed)
Pre visit review using our clinic review tool, if applicable. No additional management support is needed unless otherwise documented below in the visit note. 

## 2015-06-03 NOTE — Assessment & Plan Note (Signed)
Mother and Father. EKG today with NSR. Will check lipids, glucose. No hypertension noted.

## 2015-06-03 NOTE — Assessment & Plan Note (Signed)
Depression screen negative. Health Maintenance reviewed -- Patient to bring in previous immunization records. Preventive schedule discussed and handout given in AVS. Will obtain fasting labs today.

## 2015-07-01 ENCOUNTER — Encounter: Payer: Self-pay | Admitting: Physician Assistant

## 2015-07-01 ENCOUNTER — Ambulatory Visit (INDEPENDENT_AMBULATORY_CARE_PROVIDER_SITE_OTHER): Payer: 59 | Admitting: Physician Assistant

## 2015-07-01 VITALS — BP 118/86 | HR 89 | Temp 98.1°F | Resp 16 | Ht 66.0 in | Wt 160.5 lb

## 2015-07-01 DIAGNOSIS — I959 Hypotension, unspecified: Secondary | ICD-10-CM | POA: Insufficient documentation

## 2015-07-01 LAB — CORTISOL: Cortisol, Plasma: 5.2 ug/dL

## 2015-07-01 NOTE — Patient Instructions (Signed)
Please go to the lab for blood work. I will call you with your results. Continue hydration. Increase salt intake slightly to help keep BP at a stable level. Continue medications as directed. We will alter regimen based on your results.

## 2015-07-01 NOTE — Progress Notes (Signed)
Patient presents to clinic today for reassessment of BP. BP noted to be mildly hypotensive at last visit. Labs obtained and negative for anemia or hypoglycemia. Patient encouraged to increase hydration and add on G2 Gatorade daily. Salt intake reviewed. Since last visit patient endorses following instructions as directed.  Endorses improvement in BP as she has been checking at home. Endorses significant decrease in lightheadedness. Does endorse chronic episodes of hot flashes/sweats.  Past Medical History  Diagnosis Date  . History of chicken pox   . Family history of headaches   . Family history of early CAD     Current Outpatient Prescriptions on File Prior to Visit  Medication Sig Dispense Refill  . diclofenac sodium (VOLTAREN) 1 % GEL Apply 2 g topically 4 (four) times daily. 100 g 1  . ibuprofen (ADVIL,MOTRIN) 200 MG tablet Take 200 mg by mouth every 6 (six) hours as needed for moderate pain.    . methocarbamol (ROBAXIN) 500 MG tablet Take 1 tablet (500 mg total) by mouth every 8 (eight) hours as needed for muscle spasms. 30 tablet 2   No current facility-administered medications on file prior to visit.   No Known Allergies  Family History  Problem Relation Age of Onset  . Hyperlipidemia Mother   . Diabetes Mother   . Cancer Mother 80    Uterus,Skin,Thyroid  . Heart attack Father   . Cancer Maternal Aunt 30    Breast  . Diabetes Maternal Grandfather   . Heart attack Mother 83    Social History   Social History  . Marital Status: Single    Spouse Name: N/A  . Number of Children: N/A  . Years of Education: N/A   Social History Main Topics  . Smoking status: Never Smoker   . Smokeless tobacco: Never Used  . Alcohol Use: 0.0 oz/week    0 Standard drinks or equivalent per week     Comment: Once every few weeks  . Drug Use: No  . Sexual Activity:    Partners: Male   Other Topics Concern  . None   Social History Narrative   Review of Systems - See HPI.  All  other ROS are negative.  BP 118/86 mmHg  Pulse 89  Temp(Src) 98.1 F (36.7 C) (Oral)  Resp 16  Ht _0  (1.676 m)  Wt 160 lb 8 oz (72.802 kg)  BMI 25.92 kg/m2  SpO2 99%  LMP 05/27/2015  Physical Exam  Recent Results (from the past 2160 hour(s))  CBC     Status: None   Collection Time: 06/03/15  8:33 AM  Result Value Ref Range   WBC 9.7 4.0 - 10.5 K/uL   RBC 4.15 3.87 - 5.11 Mil/uL   Platelets 364.0 150.0 - 400.0 K/uL   Hemoglobin 12.6 12.0 - 15.0 g/dL   HCT 37.8 36.0 - 46.0 %   MCV 90.9 78.0 - 100.0 fl   MCHC 33.5 30.0 - 36.0 g/dL   RDW 14.0 11.5 - 15.5 %  Comp Met (CMET)     Status: None   Collection Time: 06/03/15  8:33 AM  Result Value Ref Range   Sodium 138 135 - 145 mEq/L   Potassium 3.9 3.5 - 5.1 mEq/L   Chloride 103 96 - 112 mEq/L   CO2 30 19 - 32 mEq/L   Glucose, Bld 82 70 - 99 mg/dL   BUN 11 6 - 23 mg/dL   Creatinine, Ser 0.69 0.40 - 1.20 mg/dL   Total Bilirubin  0.2 0.2 - 1.2 mg/dL   Alkaline Phosphatase 56 39 - 117 U/L   AST 15 0 - 37 U/L   ALT 12 0 - 35 U/L   Total Protein 7.2 6.0 - 8.3 g/dL   Albumin 4.4 3.5 - 5.2 g/dL   Calcium 9.8 8.4 - 10.5 mg/dL   GFR 113.57 >60.00 mL/min  TSH     Status: None   Collection Time: 06/03/15  8:33 AM  Result Value Ref Range   TSH 2.08 0.35 - 4.50 uIU/mL  Urinalysis, Routine w reflex microscopic     Status: Abnormal   Collection Time: 06/03/15  8:33 AM  Result Value Ref Range   Color, Urine YELLOW Yellow;Lt. Yellow   APPearance CLEAR Clear   Specific Gravity, Urine 1.010 1.000-1.030   pH 6.0 5.0 - 8.0   Total Protein, Urine NEGATIVE Negative   Urine Glucose NEGATIVE Negative   Ketones, ur NEGATIVE Negative   Bilirubin Urine NEGATIVE Negative   Hgb urine dipstick NEGATIVE Negative   Urobilinogen, UA 0.2 0.0 - 1.0   Leukocytes, UA NEGATIVE Negative   Nitrite NEGATIVE Negative   WBC, UA 0-2/hpf 0-2/hpf   RBC / HPF 0-2/hpf 0-2/hpf   Squamous Epithelial / LPF Rare(0-4/hpf) Rare(0-4/hpf)   Bacteria, UA  Rare(<10/hpf) (A) None  Lipid Profile     Status: None   Collection Time: 06/03/15  8:33 AM  Result Value Ref Range   Cholesterol 153 0 - 200 mg/dL    Comment: ATP III Classification       Desirable:  < 200 mg/dL               Borderline High:  200 - 239 mg/dL          High:  > = 240 mg/dL   Triglycerides 55.0 0.0 - 149.0 mg/dL    Comment: Normal:  <150 mg/dLBorderline High:  150 - 199 mg/dL   HDL 51.50 >39.00 mg/dL   VLDL 11.0 0.0 - 40.0 mg/dL   LDL Cholesterol 91 0 - 99 mg/dL   Total CHOL/HDL Ratio 3     Comment:                Men          Women1/2 Average Risk     3.4          3.3Average Risk          5.0          4.42X Average Risk          9.6          7.13X Average Risk          15.0          11.0                       NonHDL 101.97     Comment: NOTE:  Non-HDL goal should be 30 mg/dL higher than patient's LDL goal (i.e. LDL goal of < 70 mg/dL, would have non-HDL goal of < 100 mg/dL)    Assessment/Plan: 1. Hypotension, unspecified hypotension type Improved today with hydration and dietary changes. Again negative for anemia or hypoglycemia. Thyroid studies within normal limits. Will continue supportive measures and check a cortisol level today. - Cortisol

## 2015-07-01 NOTE — Progress Notes (Signed)
Pre visit review using our clinic review tool, if applicable. No additional management support is needed unless otherwise documented below in the visit note/SLS  

## 2015-07-02 ENCOUNTER — Telehealth: Payer: Self-pay | Admitting: Physician Assistant

## 2015-07-02 NOTE — Telephone Encounter (Signed)
Caller name:Katherine Relationship to patient:self Can be [phone] Pharmacy:  Reason for call:Returning Angie's call

## 2015-07-02 NOTE — Telephone Encounter (Signed)
Notes Recorded by Regis BillSharon L Scates, CMA on 07/01/2015 at 6:06 PM [facility] with contact name and number for return call RE: results and further provider instructions/SLS 05/01  Notes Recorded by Regis BillSharon L Scates, CMA on 07/01/2015 at 2:28 PM [facility] contact name and number for return call RE: results and further provider instructions/SLS 05/01  Notes Recorded by Waldon MerlWilliam C Martin, PA-C on 07/01/2015 at 12:05 PM Cortisol level was normal. Giving her chronic symptoms we could consider having Endocrinology assess this further. Let me know if she is willing and I will place referral.   Patient informed, understood & agreed to referral/SLS

## 2015-07-08 ENCOUNTER — Other Ambulatory Visit: Payer: Self-pay | Admitting: Physician Assistant

## 2015-07-08 DIAGNOSIS — R5382 Chronic fatigue, unspecified: Secondary | ICD-10-CM

## 2015-07-19 ENCOUNTER — Telehealth: Payer: Self-pay | Admitting: Physician Assistant

## 2015-07-19 NOTE — Telephone Encounter (Signed)
Will be TDaP

## 2015-07-19 NOTE — Telephone Encounter (Signed)
Pt called in to scheduled a tetnus and t-dap for 5/24 (Wed)

## 2015-07-24 ENCOUNTER — Ambulatory Visit (INDEPENDENT_AMBULATORY_CARE_PROVIDER_SITE_OTHER): Payer: 59 | Admitting: *Deleted

## 2015-07-24 DIAGNOSIS — Z23 Encounter for immunization: Secondary | ICD-10-CM

## 2015-07-24 DIAGNOSIS — Z111 Encounter for screening for respiratory tuberculosis: Secondary | ICD-10-CM

## 2015-07-24 NOTE — Progress Notes (Signed)
Pre visit review using our clinic review tool, if applicable. No additional management support is needed unless otherwise documented below in the visit note.  Pt tolerated injection well. No S/S of a reaction upon leaving the clinic.   Jeremiyah Cullens J Ramonda Galyon, RN  

## 2015-07-25 ENCOUNTER — Encounter: Payer: Self-pay | Admitting: Physician Assistant

## 2015-07-26 LAB — QUANTIFERON TB GOLD ASSAY (BLOOD)
Interferon Gamma Release Assay: NEGATIVE
Mitogen-Nil: >10 IU/mL
Quantiferon Nil Value: 0.02 IU/mL
Quantiferon Tb Ag Minus Nil Value: 0.01 IU/mL

## 2015-08-07 ENCOUNTER — Ambulatory Visit: Payer: Self-pay | Admitting: Physician Assistant

## 2015-08-30 DIAGNOSIS — B373 Candidiasis of vulva and vagina: Secondary | ICD-10-CM | POA: Diagnosis not present

## 2015-08-30 DIAGNOSIS — N921 Excessive and frequent menstruation with irregular cycle: Secondary | ICD-10-CM | POA: Diagnosis not present

## 2015-09-05 ENCOUNTER — Ambulatory Visit: Payer: 59 | Admitting: Medical

## 2015-09-06 ENCOUNTER — Encounter: Payer: Self-pay | Admitting: Physician Assistant

## 2015-09-06 ENCOUNTER — Ambulatory Visit (INDEPENDENT_AMBULATORY_CARE_PROVIDER_SITE_OTHER): Payer: 59 | Admitting: Physician Assistant

## 2015-09-06 VITALS — BP 98/78 | HR 65 | Temp 98.3°F | Resp 16 | Ht 66.0 in | Wt 158.2 lb

## 2015-09-06 DIAGNOSIS — J029 Acute pharyngitis, unspecified: Secondary | ICD-10-CM

## 2015-09-06 LAB — POCT RAPID STREP A (OFFICE): Rapid Strep A Screen: NEGATIVE

## 2015-09-06 MED ORDER — FLUTICASONE PROPIONATE 50 MCG/ACT NA SUSP: 2.0000 | Freq: Every day | NASAL | Status: DC

## 2015-09-06 NOTE — Patient Instructions (Signed)
Please stay well hydrated. Tylenol for throat pain. Place a humidifier in the bedroom. I want you to start a daily Claritin and use Flonase daily. Let me know if symptoms are not resolving.

## 2015-09-06 NOTE — Progress Notes (Signed)
Pre visit review using our clinic review tool, if applicable. No additional management support is needed unless otherwise documented below in the visit note/SLS  

## 2015-09-06 NOTE — Progress Notes (Signed)
Patient presents to clinic today c/o 2 weeks of R sided sore throat with ear pain. Has noted some mild PND. Denies sinus congestion, chest congestion. Denies ever, chills, malaise or fatigue. Notes some R ear pressure and popping. Has taken OTC Tylenol for pain relief.   Past Medical History  Diagnosis Date  . History of chicken pox   . Family history of headaches   . Family history of early CAD     Current Outpatient Prescriptions on File Prior to Visit  Medication Sig Dispense Refill  . diclofenac sodium (VOLTAREN) 1 % GEL Apply 2 g topically 4 (four) times daily. 100 g 1  . hydrocortisone-pramoxine (PROCTOFOAM-HC) rectal foam Place 1 applicator rectally 2 (two) times daily as needed for hemorrhoids or itching.    Marland Kitchen. ibuprofen (ADVIL,MOTRIN) 200 MG tablet Take 200 mg by mouth every 6 (six) hours as needed for moderate pain.    . methocarbamol (ROBAXIN) 500 MG tablet Take 1 tablet (500 mg total) by mouth every 8 (eight) hours as needed for muscle spasms. 30 tablet 2  . Norethindrone-Ethinyl Estradiol-Fe Biphas (LO LOESTRIN FE) 1 MG-10 MCG / 10 MCG tablet Take 1 tablet by mouth daily.     No current facility-administered medications on file prior to visit.    No Known Allergies  Family History  Problem Relation Age of Onset  . Hyperlipidemia Mother   . Diabetes Mother   . Cancer Mother 5830    Uterus,Skin,Thyroid  . Heart attack Father   . Cancer Maternal Aunt 30    Breast  . Diabetes Maternal Grandfather   . Heart attack Mother 1540    Social History   Social History  . Marital Status: Single    Spouse Name: N/A  . Number of Children: N/A  . Years of Education: N/A   Social History Main Topics  . Smoking status: Never Smoker   . Smokeless tobacco: Never Used  . Alcohol Use: 0.0 oz/week    0 Standard drinks or equivalent per week     Comment: Once every few weeks  . Drug Use: No  . Sexual Activity:    Partners: Male   Other Topics Concern  . None   Social  History Narrative    Review of Systems - See HPI.  All other ROS are negative.  BP 98/78 mmHg  Pulse 65  Temp(Src) 98.3 F (36.8 C) (Oral)  Resp 16  Ht 5\' 6"  (1.676 m)  Wt 158 lb 4 oz (71.782 kg)  BMI 25.55 kg/m2  SpO2 99%  Physical Exam  Constitutional: She is oriented to person, place, and time and well-developed, well-nourished, and in no distress.  HENT:  Head: Normocephalic and atraumatic.  Right Ear: External ear normal. A middle ear effusion is present.  Left Ear: Tympanic membrane and external ear normal.  Nose: Nose normal.  Mouth/Throat: Uvula is midline, oropharynx is clear and moist and mucous membranes are normal.  Eyes: Conjunctivae are normal. Pupils are equal, round, and reactive to light.  Neck: Neck supple.  Cardiovascular: Normal rate, regular rhythm, normal heart sounds and intact distal pulses.   Pulmonary/Chest: Effort normal and breath sounds normal. No respiratory distress. She has no wheezes. She has no rales. She exhibits no tenderness.  Lymphadenopathy:    She has no cervical adenopathy.  Neurological: She is alert and oriented to person, place, and time.  Skin: Skin is warm and dry. No rash noted.  Psychiatric: Affect normal.  Vitals reviewed.  Recent Results (from the past 2160 hour(s))  Cortisol     Status: None   Collection Time: 07/01/15  9:38 AM  Result Value Ref Range   Cortisol, Plasma 5.2 ug/dL    Comment: AM:  4.3 - 22.4 ug/dLPM:  3.1 - 16.7 ug/dL  Quantiferon tb gold assay     Status: None   Collection Time: 07/24/15 10:21 AM  Result Value Ref Range   Interferon Gamma Release Assay NEGATIVE NEGATIVE    Comment: Negative test result. M. tuberculosis complex infection unlikely.   Quantiferon Nil Value 0.02 IU/mL   Mitogen-Nil >10.00 IU/mL   Quantiferon Tb Ag Minus Nil Value 0.01 IU/mL    Comment:   The Nil tube value is used to determine if the patient has a preexisting immune response which could cause a false-positive  reading on the test. In order for a test to be valid, the Nil tube must have a value of less than or equal to 8.0 IU/mL.   The mitogen control tube is used to assure the patient has a healthy immune status and also serves as a control for correct blood handling and incubation. It is used to detect false-negative readings. The mitogen tube must have a gamma interferon value of greater than or equal to 0.5 IU/mL higher than the value of the Nil tube.   The TB antigen tube is coated with the M. tuberculosis specific antigens. For a test to be considered positive, the TB antigen tube value minus the Nil tube value must be greater than or equal to 0.35 IU/mL.   For additional information, please refer to http://education.[facility].com/faq/QFT (This link is being provided for informational/educational purposes only.)   POCT rapid strep A     Status: Normal   Collection Time: 09/06/15  4:07 PM  Result Value Ref Range   Rapid Strep A Screen Negative Negative    Assessment/Plan: 1. Sore throat Rapid strep negative. Culture sent. No evidence of retropharyngeal abscess. There is clear fluid noted behind R TM, couple with symptoms increases likelihood of ETD. Will start daily Claritin and Flonase. Supportive measures reviewed. FU if not resolving.  - POCT rapid strep A - Culture, Group A Strep   Piedad ClimesMartin, Sar[place] Dicicco Cody, PA-C

## 2015-09-08 LAB — CULTURE, GROUP A STREP

## 2015-09-09 ENCOUNTER — Ambulatory Visit (INDEPENDENT_AMBULATORY_CARE_PROVIDER_SITE_OTHER): Payer: 59 | Admitting: Internal Medicine

## 2015-09-09 ENCOUNTER — Encounter: Payer: Self-pay | Admitting: Internal Medicine

## 2015-09-09 VITALS — BP 118/82 | HR 81 | Ht 66.0 in | Wt 159.0 lb

## 2015-09-09 DIAGNOSIS — L680 Hirsutism: Secondary | ICD-10-CM | POA: Diagnosis not present

## 2015-09-09 DIAGNOSIS — R031 Nonspecific low blood-pressure reading: Secondary | ICD-10-CM | POA: Diagnosis not present

## 2015-09-09 DIAGNOSIS — Z8349 Family history of other endocrine, nutritional and metabolic diseases: Secondary | ICD-10-CM | POA: Diagnosis not present
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
## 2015-09-09 NOTE — Patient Instructions (Signed)
Please come back for labs on Thursday at 10 am.  We will schedule a new appt if the labs are abnormal.

## 2015-09-12 ENCOUNTER — Other Ambulatory Visit (INDEPENDENT_AMBULATORY_CARE_PROVIDER_SITE_OTHER): Payer: 59

## 2015-09-12 ENCOUNTER — Other Ambulatory Visit: Payer: Self-pay

## 2015-09-12 DIAGNOSIS — Z8349 Family history of other endocrine, nutritional and metabolic diseases: Secondary | ICD-10-CM | POA: Diagnosis not present

## 2015-09-12 DIAGNOSIS — R031 Nonspecific low blood-pressure reading: Secondary | ICD-10-CM | POA: Diagnosis not present

## 2015-09-12 DIAGNOSIS — L680 Hirsutism: Secondary | ICD-10-CM | POA: Diagnosis not present

## 2015-09-12 LAB — CORTISOL
CORTISOL PLASMA: 14.3 ug/dL
CORTISOL PLASMA: 27.1 ug/dL
Cortisol, Plasma: 21.9 ug/dL

## 2015-09-12 LAB — T3, FREE: T3, Free: 3.3 pg/mL (ref 2.3–4.2)

## 2015-09-12 LAB — T4, FREE: Free T4: 0.98 ng/dL (ref 0.60–1.60)

## 2015-09-12 LAB — TSH: TSH: 2.87 u[IU]/mL (ref 0.35–4.50)

## 2015-09-12 MED ORDER — COSYNTROPIN 0.25 MG IJ SOLR: 0.2500 mg | Freq: Once | INTRAMUSCULAR | Status: DC

## 2015-09-12 MED ORDER — COSYNTROPIN 0.25 MG IJ SOLR
0.2500 mg | Freq: Once | INTRAMUSCULAR | Status: AC
  Administered 2015-09-12: 0.25 mg via INTRAMUSCULAR

## 2015-09-13 LAB — THYROID PEROXIDASE ANTIBODY

## 2015-09-16 LAB — ACTH: C206 ACTH: 22 pg/mL (ref 6–50)

## 2015-09-17 DIAGNOSIS — R031 Nonspecific low blood-pressure reading: Secondary | ICD-10-CM | POA: Insufficient documentation

## 2015-09-17 DIAGNOSIS — L680 Hirsutism: Secondary | ICD-10-CM | POA: Insufficient documentation

## 2015-09-17 LAB — TESTOSTERONE, FREE, LC/MS/MS: Testosterone, Free, LCM/MS/MS: 2.5 pg/mL (ref 0.2–5.0)

## 2015-09-21 ENCOUNTER — Encounter: Payer: Self-pay | Admitting: Physician Assistant

## 2015-12-13 MED FILL — LO LOESTRIN FE 1-10 TABLET: 1 MG-10 MCG | 28 days supply | Qty: 28 | Fill #0

## 2016-01-01 ENCOUNTER — Encounter: Payer: Self-pay | Admitting: Physician Assistant

## 2016-01-06 ENCOUNTER — Ambulatory Visit (INDEPENDENT_AMBULATORY_CARE_PROVIDER_SITE_OTHER): Payer: 59 | Admitting: Physician Assistant

## 2016-01-06 ENCOUNTER — Encounter: Payer: Self-pay | Admitting: Physician Assistant

## 2016-01-06 VITALS — BP 122/76 | HR 76 | Temp 98.3°F | Ht 66.0 in | Wt 163.2 lb

## 2016-01-06 DIAGNOSIS — R5383 Other fatigue: Secondary | ICD-10-CM

## 2016-01-06 DIAGNOSIS — N926 Irregular menstruation, unspecified: Secondary | ICD-10-CM

## 2016-01-06 LAB — POCT URINE PREGNANCY: Preg Test, Ur: NEGATIVE

## 2016-01-06 NOTE — Progress Notes (Signed)
Pre visit review using our clinic review tool, if applicable. No additional management support is needed unless otherwise documented below in the visit note/SLS  

## 2016-01-06 NOTE — Patient Instructions (Signed)
Your urine pregnancy is negative.  We will confirm with blood testing. We will also assess blood count and thyroid levels giving fatigue. Stay well hydrated and eat a well-balanced diet.

## 2016-01-06 NOTE — Progress Notes (Signed)
Patient presents to clinic today to discuss home pregnancy test results. Took a home pregnancy test on early last week. Noted a potential false positive test on home testing. Did not repeat test at home due to cost. Patient is currently on Lo Loestrin Fe. Has not had menstrual period since starting OCPs. Is currently sexually active with one female partner. No protection. Denies concerns for STI. Has noted fatigue recently, potentially related to stress but concern is related to being pregnant.  Past Medical History:  Diagnosis Date  . Family history of early CAD   . Family history of headaches   . History of chicken pox     Current Outpatient Prescriptions on File Prior to Visit  Medication Sig Dispense Refill  . diclofenac sodium (VOLTAREN) 1 % GEL Apply 2 g topically 4 (four) times daily. 100 g 1  . hydrocortisone-pramoxine (PROCTOFOAM-HC) rectal foam Place 1 applicator rectally 2 (two) times daily as needed for hemorrhoids or itching.    Marland Kitchen. ibuprofen (ADVIL,MOTRIN) 200 MG tablet Take 200 mg by mouth every 6 (six) hours as needed for moderate pain.    . methocarbamol (ROBAXIN) 500 MG tablet Take 1 tablet (500 mg total) by mouth every 8 (eight) hours as needed for muscle spasms. 30 tablet 2  . Norethindrone-Ethinyl Estradiol-Fe Biphas (LO LOESTRIN FE) 1 MG-10 MCG / 10 MCG tablet Take 1 tablet by mouth daily.     No current facility-administered medications on file prior to visit.     No Known Allergies  Family History  Problem Relation Age of Onset  . Hyperlipidemia Mother   . Diabetes Mother   . Cancer Mother [id number]    Uterus,Skin,Thyroid  . Heart attack Father   . Cancer Maternal Aunt 30    Breast  . Diabetes Maternal Grandfather   . Heart attack Mother 7740    Social History   Social History  . Marital status: Single    Spouse name: N/A  . Number of children: N/A  . Years of education: N/A   Social History Main Topics  . Smoking status: Never Smoker  . Smokeless tobacco:  Never Used  . Alcohol use 0.0 oz/week     Comment: Once every few weeks  . Drug use: No  . Sexual activity: Yes    Partners: Male   Other Topics Concern  . None   Social History Narrative  . None   Review of Systems - See HPI.  All other ROS are negative.  BP 122/76 (BP Location: Left Arm, Patient Position: Sitting, Cuff Size: Normal)   Pulse 76   Temp 98.3 F (36.8 C) (Oral)   Ht 5\' 6"  (1.676 m)   Wt 163 lb 4 oz (74 kg)   LMP 07/01/2015 Comment: Since starting Oral BC  BMI 26.35 kg/m   Physical Exam  Constitutional: She is well-developed, well-nourished, and in no distress.  HENT:  Head: Normocephalic and atraumatic.  Cardiovascular: Normal rate, regular rhythm, normal heart sounds and intact distal pulses.   Pulmonary/Chest: Effort normal and breath sounds normal. No respiratory distress. She has no wheezes. She has no rales. She exhibits no tenderness.  Neurological: She is alert.  Skin: Skin is warm and dry. No rash noted.  Psychiatric: Affect normal.  Vitals reviewed.  Assessment/Plan: 1. Menstrual period late No menstrual period since starting OCPS prescribed by Gynecology. Urine pregnancy in office negative. Will obtain serum HCG due to patient's concerns. - POCT urine pregnancy - hCG, serum, qualitative  2. Other  fatigue Suspect stress related. Supportive measures reviewed. Will obtain lab panel. - CBC - TSH   Piedad ClimesMartin, Travonte Byard Cody, PA-C

## 2016-01-07 LAB — CBC
HCT: 38.9 % (ref 36.0–46.0)
HEMOGLOBIN: 13.3 g/dL (ref 12.0–15.0)
MCHC: 34.2 g/dL (ref 30.0–36.0)
MCV: 93.6 fl (ref 78.0–100.0)
PLATELETS: 350 10*3/uL (ref 150.0–400.0)
RBC: 4.16 Mil/uL (ref 3.87–5.11)
RDW: 13 % (ref 11.5–15.5)
WBC: 7.5 10*3/uL (ref 4.0–10.5)

## 2016-01-07 LAB — TSH: TSH: 2.08 u[IU]/mL (ref 0.35–4.50)

## 2016-01-07 LAB — HCG, SERUM, QUALITATIVE: PREG SERUM: NEGATIVE

## 2016-01-08 MED FILL — NORETHIN-ESTRAD-FERR 1-0.02: 1-20 | 84 days supply | Qty: 84 | Fill #0

## 2016-02-17 ENCOUNTER — Encounter: Payer: Self-pay | Admitting: Physician Assistant

## 2016-02-18 ENCOUNTER — Encounter: Payer: Self-pay | Admitting: Physician Assistant

## 2016-02-18 ENCOUNTER — Ambulatory Visit (INDEPENDENT_AMBULATORY_CARE_PROVIDER_SITE_OTHER): Payer: 59 | Admitting: Physician Assistant

## 2016-02-18 VITALS — BP 126/78 | HR 87 | Temp 98.1°F | Resp 16 | Ht 66.0 in | Wt 161.0 lb

## 2016-02-18 DIAGNOSIS — R002 Palpitations: Secondary | ICD-10-CM

## 2016-02-18 LAB — CBC
HEMATOCRIT: 38.1 % (ref 36.0–46.0)
Hemoglobin: 13.1 g/dL (ref 12.0–15.0)
MCHC: 34.3 g/dL (ref 30.0–36.0)
MCV: 93.3 fl (ref 78.0–100.0)
PLATELETS: 347 10*3/uL (ref 150.0–400.0)
RBC: 4.08 Mil/uL (ref 3.87–5.11)
RDW: 12.6 % (ref 11.5–15.5)
WBC: 6.6 10*3/uL (ref 4.0–10.5)

## 2016-02-18 LAB — BASIC METABOLIC PANEL
BUN: 7 mg/dL (ref 6–23)
CALCIUM: 9.5 mg/dL (ref 8.4–10.5)
CO2: 27 meq/L (ref 19–32)
Chloride: 105 mEq/L (ref 96–112)
Creatinine, Ser: 0.78 mg/dL (ref 0.40–1.20)
GFR: 97.94 mL/min (ref >60.00)
GLUCOSE: 113 mg/dL — AB (ref 70–99)
Potassium: 3.7 mEq/L (ref 3.5–5.1)
Sodium: 139 mEq/L (ref 135–145)

## 2016-02-18 LAB — T4, FREE: Free T4: 0.86 ng/dL (ref 0.60–1.60)

## 2016-02-18 LAB — TSH: TSH: 1.35 u[IU]/mL (ref 0.35–4.50)

## 2016-02-18 NOTE — Progress Notes (Signed)
Patient presents to clinic today c/o episodic palpitations started this past week while at work. Endorses leaning over at work to pick up something. On standing, noted racing heart lasting 5-10 minutes. 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 Denies chest pain, lightheadedness or dizziness. Did not mild SOB but felt it was more anxiety due to racing heart. Endorses getting up and completing her shift, noting a couple more episodes of racing heart lasting only a minute or two each. Since that time has noted a couple of episodes lasting 5 minutes or so. Again denies chest pain, lightheadedness of dizziness. Is feeling well today. Denies change to diet. Denies alcohol or excess caffeine intake. Stays hydrated at work. Denies stress or anxiety. Actually noted improved stress levels over the past month. Denies personal of family history of arrhythmia or thyroid abnormality.   Past Medical History:  Diagnosis Date  . Family history of early CAD   . Family history of headaches   . History of chicken pox     Current Outpatient Prescriptions on File Prior to Visit  Medication Sig Dispense Refill  . diclofenac sodium (VOLTAREN) 1 % GEL Apply 2 g topically 4 (four) times daily. 100 g 1  . hydrocortisone-pramoxine (PROCTOFOAM-HC) rectal foam Place 1 applicator rectally 2 (two) times daily as needed for hemorrhoids or itching.    Marland Kitchen. ibuprofen (ADVIL,MOTRIN) 200 MG tablet Take 200 mg by mouth every 6 (six) hours as needed for moderate pain.    . methocarbamol (ROBAXIN) 500 MG tablet Take 1 tablet (500 mg total) by mouth every 8 (eight) hours as needed for muscle spasms. 30 tablet 2  . Norethindrone-Ethinyl Estradiol-Fe Biphas (LO LOESTRIN FE) 1 MG-10 MCG / 10 MCG tablet Take 1 tablet by mouth daily.     No current facility-administered medications on file prior to visit.     No Known Allergies  Family History  Problem Relation Age of Onset  .  Hyperlipidemia Mother   . Diabetes Mother   . Cancer Mother 3830    Uterus,Skin,Thyroid  . Heart attack Father   . Cancer Maternal Aunt 30    Breast  . Diabetes Maternal Grandfather   . Heart attack Mother 6140    Social History   Social History  . Marital status: Single    Spouse name: N/A  . Number of children: N/A  . Years of education: N/A   Social History Main Topics  . Smoking status: Never Smoker  . Smokeless tobacco: Never Used  . Alcohol use 0.0 oz/week     Comment: Once every few weeks  . Drug use: No  . Sexual activity: Yes    Partners: Male   Other Topics Concern  . None   Social History Narrative  . None    Review of Systems - See HPI.  All other ROS are negative.  BP 126/78   Pulse 87   Temp 98.1 F (36.7 C) (Oral)   Resp 16   Ht 5\' 6"  (1.676 m)   Wt 161 lb (73 kg)   SpO2 99%   BMI 25.99 kg/m   Physical Exam  Constitutional: She is oriented to person, place, and time and well-developed, well-nourished, and in no distress.  HENT:  Head: Normocephalic and atraumatic.  Right Ear: External ear normal.  Left Ear: External ear normal.  Nose: Nose normal.  Mouth/Throat: Oropharynx is clear and moist. No oropharyngeal exudate.  TM  within normal limits bilaterally.  Eyes: Conjunctivae are normal.  Neck: Neck supple. No thyromegaly present.  Cardiovascular: Normal rate, regular rhythm, normal heart sounds and intact distal pulses.   Pulmonary/Chest: Effort normal and breath sounds normal. No respiratory distress. She has no wheezes. She has no rales. She exhibits no tenderness.  Neurological: She is alert and oriented to person, place, and time.  Skin: Skin is warm and dry. No rash noted.  Psychiatric: Affect normal.  Vitals reviewed.   Recent Results (from the past 2160 hour(s))  CBC     Status: None   Collection Time: 01/06/16  4:57 PM  Result Value Ref Range   WBC 7.5 4.0 - 10.5 K/uL   RBC 4.16 3.87 - 5.11 Mil/uL   Platelets 350.0 150.0 -  400.0 K/uL   Hemoglobin 13.3 12.0 - 15.0 g/dL   HCT 16.138.9 09.636.0 - 04.546.0 %   MCV 93.6 78.0 - 100.0 fl   MCHC 34.2 30.0 - 36.0 g/dL   RDW 40.913.0 81.111.5 - 91.415.5 %  TSH     Status: None   Collection Time: 01/06/16  4:57 PM  Result Value Ref Range   TSH 2.08 0.35 - 4.50 uIU/mL  hCG, serum, qualitative     Status: None   Collection Time: 01/06/16  4:57 PM  Result Value Ref Range   Preg, Serum NEGATIVE     Comment:   Reference Range Non-pregnant: Negative Pregnant:     Positive   POCT urine pregnancy     Status: Normal   Collection Time: 01/06/16  4:59 PM  Result Value Ref Range   Preg Test, Ur Negative Negative    Assessment/Plan: 1. Palpitations EKG obtained revealing NSR. OVS are negative. Vitals stable. Exam unremarkable. Unclear etiology. Will check CBC, BMP, TSH and Free T4. Urgent referral placed to Cardiology. Alarm signs/symptoms discussed with patient that would prompt ER assessment. Patient voices understanding. Will alter regimen based on results.   - EKG 12-Lead - CBC - Basic metabolic panel - TSH - T4, free - Ambulatory referral to Cardiology   Piedad ClimesMartin, Jaxyn Mestas Cody, PA-C

## 2016-02-18 NOTE — Progress Notes (Signed)
Pre visit review using our clinic review tool, if applicable. No additional management support is needed unless otherwise documented below in the visit note. 

## 2016-02-18 NOTE — Patient Instructions (Signed)
Please go to the lab for blood work. I will call you with your results.  Please stay well hydrated and get plenty of rest. No caffeine or alcohol for now. You will be contacted by Cardiology for assessment. If you do not hear from them by Thursday, please let me know.  If there is any significant recurrence of symptoms or you note any shortness of breath, chest discomfort, lightheadedness with symptoms, please call 911 or have someone carry you to the ER.

## 2016-02-18 NOTE — Telephone Encounter (Signed)
Please call patient to triage ASAP

## 2016-02-18 NOTE — Telephone Encounter (Signed)
Called pt, she reports a total of 4 episodes of chest pressure 'right over my heart' + tachycardia w/ associated mild SOB since Sunday. These episodes have been brief, relieved by sitting down and resting. HR increases w/ standing. She states her O2 sat remains 100% throughout. A nurse coworker did orthostatics, which were negative. She denies dizziness, lightheadedness, weakness, and syncope. She currently has no symptoms. Denies any recent changes to medications, activity, or caffeine intake. Given lack of current symptoms, pt scheduled for acute appt today w/ PCP. Instructed pt that if symptoms recur prior to appt, she should go to ED instead of office, and pt agreed to instructions.

## 2016-02-19 ENCOUNTER — Ambulatory Visit (INDEPENDENT_AMBULATORY_CARE_PROVIDER_SITE_OTHER): Payer: 59 | Admitting: Cardiology

## 2016-02-19 ENCOUNTER — Encounter: Payer: Self-pay | Admitting: Cardiology

## 2016-02-19 VITALS — BP 112/70 | HR 72 | Ht 66.0 in | Wt 160.2 lb

## 2016-02-19 DIAGNOSIS — R002 Palpitations: Secondary | ICD-10-CM

## 2016-02-19 NOTE — Patient Instructions (Signed)
Medication Instructions:    Your physician recommends that you continue on your current medications as directed. Please refer to the Current Medication list given to you today.  --- If you need a refill on your cardiac medications before your next appointment, please call your pharmacy. ---  Labwork:  None ordered  Testing/Procedures: Your physician has recommended that you wear an event monitor. Event monitors are medical devices that record the heart's electrical activity. Doctors most often us these monitors to diagnose arrhythmias. Arrhythmias are problems with the speed or rhythm of the heartbeat. The monitor is a small, portable device. You can wear one while you do your normal daily activities. This is usually used to diagnose what is causing palpitations/syncope (passing out).  Follow-Up:  Your physician recommends that you schedule a follow-up appointment in: 6 weeks with Dr. Elberta Fortisamnitz.  Thank you for choosing [facility]!!   Dory HornSherri Izael Bessinger, RN [phone]  Any Other Special Instructions Will Be Listed Below (If Applicable).   Cardiac Event Monitoring A cardiac event monitor is a small recording device used to help detect abnormal heart rhythms (arrhythmias). The monitor is used to record heart rhythm when noticeable symptoms such as the following occur:  Fast heartbeats (palpitations), such as heart racing or fluttering.  Dizziness.  Fainting or light-headedness.  Unexplained weakness. The monitor is wired to two electrodes placed on your chest. Electrodes are flat, sticky disks that attach to your skin. The monitor can be worn for up to 30 days. You will wear the monitor at all times, except when bathing. How to use your cardiac event monitor A technician will prepare your chest for the electrode placement. The technician will show you how to place the electrodes, how to work the monitor, and how to replace the batteries. Take time to practice using the monitor  before you leave the office. Make sure you understand how to send the information from the monitor to your health care provider. This requires a telephone with a landline, not a cell phone. You need to:  Wear your monitor at all times, except when you are in water:  Do not get the monitor wet.  Take the monitor off when bathing. Do not swim or use a hot tub with it on.  Keep your skin clean. Do not put body lotion or moisturizer on your chest.  Change the electrodes daily or any time they stop sticking to your skin. You might need to use tape to keep them on.  It is possible that your skin under the electrodes could become irritated. To keep this from happening, try to put the electrodes in slightly different places on your chest. However, they must remain in the area under your left breast and in the upper right section of your chest.  Make sure the monitor is safely clipped to your clothing or in a location close to your body that your health care provider recommends.  Press the button to record when you feel symptoms of heart trouble, such as dizziness, weakness, light-headedness, palpitations, thumping, shortness of breath, unexplained weakness, or a fluttering or racing heart. The monitor is always on and records what happened slightly before you pressed the button, so do not worry about being too late to get good information.  Keep a diary of your activities, such as walking, doing chores, and taking medicine. It is especially important to note what you were doing when you pushed the button to record your symptoms. This will help your health care  provider determine what might be contributing to your symptoms. The information stored in your monitor will be reviewed by your health care provider alongside your diary entries.  Send the recorded information as recommended by your health care provider. It is important to understand that it will take some time for your health care provider to  process the results.  Change the batteries as recommended by your health care provider. Get help right away if:  You have chest pain.  You have extreme difficulty breathing or shortness of breath.  You develop a very fast heartbeat that persists.  You develop dizziness that does not go away.  You faint or constantly feel you are about to faint. This information is not intended to replace advice given to you by your health care provider. Make sure you discuss any questions you have with your health care provider. Document Released: 11/26/2007 Document Revised: 07/25/2015 Document Reviewed: 08/15/2012 Elsevier Interactive Patient Education  2017 [employer]

## 2016-02-19 NOTE — Progress Notes (Signed)
Electrophysiology Office Note   Date:  02/19/2016   ID:  Erin Goodwin, DOB Feb 14, 1996, MRN [id number]  PCP:  Piedad ClimesMartin, William Cody, PA-C  Primary Electrophysiologist:  Taryn Shellhammer Jorja LoaMartin Ishaan Villamar, MD    Chief Complaint  Patient presents with  . New Patient (Initial Visit)    Palpitations     History of Present Illness: Erin Goodwin is a 20 y.o. female who presents today for electrophysiology evaluation.   Palpitations began last week while at work.  On standing, had heart racing for lasting 5-10 minutes. The patient sat down and checked her pulse, was 120. She says that she was SOB at the time but feels that this was due to anxiety over her elevated HR. She had symptoms again on Monday. The symptoms last a few minutes.   Today, she denies symptoms of palpitations, chest pain, shortness of breath, orthopnea, PND, lower extremity edema, claudication, dizziness, presyncope, syncope, bleeding, or neurologic sequela. The patient is tolerating medications without difficulties and is otherwise without complaint today.    Past Medical History:  Diagnosis Date  . Family history of early CAD   . Family history of headaches   . History of chicken pox    Past Surgical History:  Procedure Laterality Date  . TONSILLECTOMY AND ADENOIDECTOMY       Current Outpatient Prescriptions  Medication Sig Dispense Refill  . diclofenac sodium (VOLTAREN) 1 % GEL Apply 2 g topically 4 (four) times daily. 100 g 1  . hydrocortisone-pramoxine (PROCTOFOAM-HC) rectal foam Place 1 applicator rectally 2 (two) times daily as needed for hemorrhoids or itching.    Marland Kitchen. ibuprofen (ADVIL,MOTRIN) 200 MG tablet Take 200 mg by mouth every 6 (six) hours as needed for moderate pain.    . methocarbamol (ROBAXIN) 500 MG tablet Take 1 tablet (500 mg total) by mouth every 8 (eight) hours as needed for muscle spasms. 30 tablet 2  . norethindrone-ethinyl estradiol (LOESTRIN 1/20, 21,) 1-20 MG-MCG tablet Take 1 tablet by mouth daily.       No current facility-administered medications for this visit.     Allergies:   Patient has no known allergies.   Social History:  The patient  reports that she has never smoked. She has never used smokeless tobacco. She reports that she drinks alcohol. She reports that she does not use drugs.   Family History:  The patient's family history includes Cancer (age of onset: 5530) in her maternal aunt and mother; Diabetes in her maternal grandfather and mother; Heart attack in her father; Heart attack (age of onset: 7040) in her mother; Hyperlipidemia in her mother.    ROS:  Please see the history of present illness.   Otherwise, review of systems is positive for palpitations.   All other systems are reviewed and negative.    PHYSICAL EXAM: VS:  BP 112/70   Pulse 72   Ht 5\' 6"  (1.676 m)   Wt 160 lb 3.2 oz (72.7 kg)   BMI 25.86 kg/m  , BMI Body mass index is 25.86 kg/m. GEN: Well nourished, well developed, in no acute distress  HEENT: normal  Neck: no JVD, carotid bruits, or masses Cardiac: RRR; no murmurs, rubs, or gallops,no edema  Respiratory:  clear to auscultation bilaterally, normal work of breathing GI: soft, nontender, nondistended, + BS MS: no deformity or atrophy  Skin: warm and dry Neuro:  Strength and sensation are intact Psych: euthymic mood, full affect  EKG:  EKG is not ordered today. Personal review of the ekg  ordered 12/19/17shows sinus rhythm  Recent Labs: 06/03/2015: ALT 12 02/18/2016: BUN 7; Creatinine, Ser 0.78; Hemoglobin 13.1; Platelets 347.0; Potassium 3.7; Sodium 139; TSH 1.35    Lipid Panel     Component Value Date/Time   CHOL 153 06/03/2015 0833   TRIG 55.0 06/03/2015 0833   HDL 51.50 06/03/2015 0833   CHOLHDL 3 06/03/2015 0833   VLDL 11.0 06/03/2015 0833   LDLCALC 91 06/03/2015 0833     Wt Readings from Last 3 Encounters:  02/19/16 160 lb 3.2 oz (72.7 kg)  02/18/16 161 lb (73 kg)  01/06/16 163 lb 4 oz (74 kg)      Other studies  Reviewed: Additional studies/ records that were reviewed today include: Epic notes  ASSESSMENT AND PLAN:  1.  Palpitations: currently no obvious cause of palpitations.  Possibly due to SVT or sinus tachycardia.  She has a normal ECG which does not give insite to her issues.  Plan for 30 day monitoring to further determine her issues.      Current medicines are reviewed at length with the patient today.   The patient does not have concerns regarding her medicines.  The following changes were made today:  none  Labs/ tests ordered today include:  No orders of the defined types were placed in this encounter.    Disposition:   FU with Wylene Weissman 6 weeks  Signed, Louan Base Jorja LoaMartin Catlin Doria, MD  02/19/2016 9:41 AM     [facility] [address] [place] [address] [phone] (office) [phone] (fax)

## 2016-02-20 ENCOUNTER — Emergency Department (HOSPITAL_BASED_OUTPATIENT_CLINIC_OR_DEPARTMENT_OTHER)
Admission: EM | Admit: 2016-02-20 | Discharge: 2016-02-20 | Disposition: A | Discharge status: Home / Self Care | Payer: 59 | Attending: Physician Assistant | Admitting: Physician Assistant

## 2016-02-20 ENCOUNTER — Encounter (HOSPITAL_BASED_OUTPATIENT_CLINIC_OR_DEPARTMENT_OTHER): Payer: Self-pay

## 2016-02-20 ENCOUNTER — Emergency Department (HOSPITAL_BASED_OUTPATIENT_CLINIC_OR_DEPARTMENT_OTHER): Payer: 59

## 2016-02-20 ENCOUNTER — Encounter: Payer: Self-pay | Admitting: Physician Assistant

## 2016-02-20 DIAGNOSIS — R0602 Shortness of breath: Secondary | ICD-10-CM | POA: Insufficient documentation

## 2016-02-20 DIAGNOSIS — R002 Palpitations: Secondary | ICD-10-CM | POA: Insufficient documentation

## 2016-02-20 DIAGNOSIS — Z791 Long term (current) use of non-steroidal anti-inflammatories (NSAID): Secondary | ICD-10-CM | POA: Diagnosis not present

## 2016-02-20 DIAGNOSIS — R5383 Other fatigue: Secondary | ICD-10-CM | POA: Insufficient documentation

## 2016-02-20 DIAGNOSIS — R000 Tachycardia, unspecified: Secondary | ICD-10-CM | POA: Diagnosis not present

## 2016-02-20 LAB — PREGNANCY, URINE: PREG TEST UR: NEGATIVE

## 2016-02-20 LAB — D-DIMER, QUANTITATIVE: D-Dimer, Quant: 0.6 ug/mL-FEU — ABNORMAL HIGH (ref 0.00–0.50)

## 2016-02-20 MED ORDER — IOPAMIDOL (ISOVUE-370) INJECTION 76%
100.0000 mL | Freq: Once | INTRAVENOUS | Status: AC | PRN
  Administered 2016-02-20: 100 mL via INTRAVENOUS

## 2016-02-20 NOTE — ED Provider Notes (Signed)
[facility] Provider Note   CSN: [id number] Arrival date & time: 02/20/16  2113     History   Chief Complaint Chief Complaint  Patient presents with  . Tachycardia    HPI Erin Goodwin is a 20 y.o. female.  HPI   Pt is a pleasant 20 yo Female presenting with tachycardia. Patient has had feelings of palpitations since Sunday. SOB occasionally. No CP. Patient was seen by cardiology on Wednesday. At that time cardiology felt like she was having variable heart rate that was elevated, scheduled for Holter monitor in 5 days.   Patient on OCPs. No leg swelling, no leg pain.No recent long flights, car rides.   Family history of thyroid disease in her mother. Also family history of early cardiac disease in mother and father.    HR in the ED 80s while lying, up to 130 while standing. Palpiations come/go inconsistently.     Past Medical History:  Diagnosis Date  . Family history of early CAD   . Family history of headaches   . History of chicken pox     Patient Active Problem List   Diagnosis Date Noted  . Borderline low blood pressure determined by examination 09/17/2015  . Hirsutism 09/17/2015  . Arterial hypotension 07/01/2015  . Visit for preventive health examination 06/03/2015  . Episodic lightheadedness 06/03/2015  . Family history of early CAD 06/03/2015  . Chronic knee pain 06/03/2015    Past Surgical History:  Procedure Laterality Date  . TONSILLECTOMY AND ADENOIDECTOMY      OB History    No data available       Home Medications    Prior to Admission medications   Medication Sig Start Date End Date Taking? Authorizing Provider  diclofenac sodium (VOLTAREN) 1 % GEL Apply 2 g topically 4 (four) times daily. 06/03/15   Waldon MerlWilliam C Martin, PA-C  hydrocortisone-pramoxine [facility](PROCTOFOAM-HC) rectal foam Place 1 applicator rectally 2 (two) times daily as needed for hemorrhoids or itching.    Historical Provider, MD  ibuprofen (ADVIL,MOTRIN) 200 MG  tablet Take 200 mg by mouth every 6 (six) hours as needed for moderate pain.    Historical Provider, MD  methocarbamol (ROBAXIN) 500 MG tablet Take 1 tablet (500 mg total) by mouth every 8 (eight) hours as needed for muscle spasms. 06/03/15   Waldon MerlWilliam C Martin, PA-C  norethindrone-ethinyl estradiol (LOESTRIN 1/20, 21,) 1-20 MG-MCG tablet Take 1 tablet by mouth daily.    Historical Provider, MD    Family History Family History  Problem Relation Age of Onset  . Hyperlipidemia Mother   . Diabetes Mother   . Cancer Mother 2830    Uterus,Skin,Thyroid  . Heart attack Mother 6740  . Heart attack Father   . Cancer Maternal Aunt 30    Breast  . Diabetes Maternal Grandfather     Social History Social History  Substance Use Topics  . Smoking status: Never Smoker  . Smokeless tobacco: Never Used  . Alcohol use 0.0 oz/week     Comment: Once every few weeks     Allergies   Patient has no known allergies.   Review of Systems Review of Systems  Constitutional: Positive for fatigue. Negative for fever.  Respiratory: Positive for shortness of breath.   Cardiovascular: Positive for palpitations. Negative for chest pain.     Physical Exam Updated Vital Signs BP 126/75 (BP Location: Right Arm)   Pulse 106   Temp 98.5 F (36.9 C)   Resp 18   SpO2  99%   Physical Exam  Constitutional: She is oriented to person, place, and time. She appears well-developed and well-nourished.  HENT:  Head: Normocephalic and atraumatic.  Eyes: Right eye exhibits no discharge.  Cardiovascular:  No murmur heard. tachycardia  Pulmonary/Chest: Effort normal and breath sounds normal. She has no wheezes. She has no rales.  Abdominal: Soft. She exhibits no distension. There is no tenderness.  Neurological: She is oriented to person, place, and time.  Skin: Skin is warm and dry. She is not diaphoretic.  Psychiatric: She has a normal mood and affect.  Nursing note and vitals reviewed.    ED Treatments /  Results  Labs (all labs ordered are listed, but only abnormal results are displayed) Labs Reviewed  D-DIMER, QUANTITATIVE (NOT AT [facility]) - Abnormal; Notable for the following:       Result Value   D-Dimer, Quant 0.60 (*)    All other components within normal limits  PREGNANCY, URINE    EKG  EKG Interpretation None     EKG at bedside, no STEMI, sinus tachycardia.   Radiology Ct Angio Chest Pe W And/or Wo Contrast  Result Date: 02/20/2016 CLINICAL DATA:  Tachycardia and dyspnea for 4 days. Elevated D-dimer EXAM: CT ANGIOGRAPHY CHEST WITH CONTRAST TECHNIQUE: Multidetector CT imaging of the chest was performed using the standard protocol during bolus administration of intravenous contrast. Multiplanar CT image reconstructions and MIPs were obtained to evaluate the vascular anatomy. CONTRAST:  100 mL Isovue 370 intravenous COMPARISON:  None. FINDINGS: Cardiovascular: Satisfactory opacification of the pulmonary arteries to the segmental level. No evidence of pulmonary embolism. Normal heart size. No pericardial effusion. Mediastinum/Nodes: No enlarged mediastinal, hilar, or axillary lymph nodes. Thyroid gland, trachea, and esophagus demonstrate no significant findings. Lungs/Pleura: Lungs are clear. No pleural effusion or pneumothorax. Upper Abdomen: No acute abnormality. Musculoskeletal: No chest wall abnormality. No acute or significant osseous findings. Review of the MIP images confirms the above findings. IMPRESSION: No significant abnormality.  Normal chest Electronically Signed   By: Ellery Plunkaniel R Mitchell M.D.   On: 02/20/2016 22:29    Procedures Procedures (including critical care time)  Medications Ordered in ED Medications  iopamidol (ISOVUE-370) 76 % injection 100 mL (100 mLs Intravenous Contrast Given 02/20/16 2215)     Initial Impression / Assessment and Plan / ED Course  I have reviewed the triage vital signs and the nursing notes.  Pertinent labs & imaging results that were  available during my care of the patient were reviewed by me and considered in my medical decision making (see chart for details).  Clinical Course     Patient is very pleasant 20 year old female presenting with history of palpitations and shortness of breath. Patient on OCPs. Patient has had normal thyroid test normal CBC and Chem-7 this week. Also been seen by cardiology, who noted no abnormalities on her EKG. Therefore the last thing I was concerned about is a pulmonary embolism given her history of OCP use and tachycardia. Will do d-dimer here.  10:57 PM D-dimer positive. CT angio negative. We'll have patient follow up with her PCP, and cardiologist. Pt has normal EKG (pt has her EKGs) and no chest pain, no vomiting, no other symptoms, so I don' see any grounds for  admission.Eating and drinking normally. No SVT. Sinus tachycardia. Patient has no complaints, no pain.   Will have her get holter on Wednesday and follow up wth outpatient physicians.   Final Clinical Impressions(s) / ED Diagnoses   Final diagnoses:  Palpitations  New Prescriptions Discharge Medication List as of 02/20/2016 10:46 PM       Takyah Ciaramitaro Randall An, MD 02/20/16 2257

## 2016-02-20 NOTE — Discharge Instructions (Signed)
You have paroxysmal tachycardia. We are unsure what is causing this at this time. I want you to call your PCP and tell them you're continuing to have symptoms. In addition please follow-up with your cardiologist. The Holter monitor may give us a valuable information about this. You could have posturally-related tachycardia. This will need further workup.

## 2016-02-20 NOTE — ED Triage Notes (Signed)
Pt is having tachycardia while at standing up or moving around, sitting is normal. Pt states pulse got as high as 156. Pt denies pain or dizziness, states had a episode of SOB and chest pressure. Pt states started having these s/s on Sunday and saw her doctor yesterday.

## 2016-02-26 ENCOUNTER — Ambulatory Visit (INDEPENDENT_AMBULATORY_CARE_PROVIDER_SITE_OTHER): Payer: 59

## 2016-02-26 ENCOUNTER — Encounter: Payer: Self-pay | Admitting: Family Medicine

## 2016-02-26 ENCOUNTER — Ambulatory Visit (INDEPENDENT_AMBULATORY_CARE_PROVIDER_SITE_OTHER): Payer: 59 | Admitting: Family Medicine

## 2016-02-26 VITALS — BP 118/86 | HR 114 | Temp 98.2°F | Resp 16 | Ht 66.0 in | Wt 161.1 lb

## 2016-02-26 DIAGNOSIS — R002 Palpitations: Secondary | ICD-10-CM | POA: Diagnosis not present

## 2016-02-26 DIAGNOSIS — B9789 Other viral agents as the cause of diseases classified elsewhere: Secondary | ICD-10-CM

## 2016-02-26 DIAGNOSIS — J069 Acute upper respiratory infection, unspecified: Secondary | ICD-10-CM

## 2016-02-26 MED ORDER — GUAIFENESIN-CODEINE 100-10 MG/5ML PO SYRP: 10.0000 mL | ORAL_SOLUTION | Freq: Three times a day (TID) | ORAL | 0 refills | Status: DC | PRN

## 2016-02-26 MED FILL — GUAIATUSSIN AC LIQUID: 100-10 | 8 days supply | Qty: 240 | Fill #0

## 2016-02-26 NOTE — Patient Instructions (Signed)
Follow up as scheduled w/ Cody Use the cough syrup as needed- best for nights and weekends as it can cause drowsiness Drink plenty of fluids REST! Mucinex DM for daytime cough Call with any questions or concerns Hang in there!!!

## 2016-02-26 NOTE — Progress Notes (Signed)
Pre visit review using our clinic review tool, if applicable. No additional management support is needed unless otherwise documented below in the visit note. 

## 2016-02-26 NOTE — Progress Notes (Signed)
   Subjective:    Patient ID: Erin Goodwin, female    DOB: 28-Sep-1995, 20 y.o.   MRN: [id number]  HPI ER f/u- pt saw Selena BattenCody on 12/19 for palpitations, saw cards on 12/20, and went to ER on 12/21- all for palpitations.  She has outpt holter monitor arranged for today.  Labs WNL w/ exception of D Dimer in ER but CTA of chest was negative.  HR today in office 114.  URI- + sick contacts.  + sore throat, some nasal congestion- improved w/ Flonase yesterday.  No fevers.  No sinus pain/pressure.  No ear pain.  No N/V.  + cough- now productive.  Only SOB w/ palpitations, no wheezing.   Review of Systems For ROS see HPI     Objective:   Physical Exam  Constitutional: She is oriented to person, place, and time. She appears well-developed and well-nourished. No distress.  HENT:  Head: Normocephalic and atraumatic.  TMs normal bilaterally Mild nasal congestion Throat w/out erythema, edema, or exudate  Eyes: Conjunctivae and EOM are normal. Pupils are equal, round, and reactive to light.  Neck: Normal range of motion. Neck supple.  Cardiovascular: Regular rhythm, normal heart sounds and intact distal pulses.   No murmur heard. Tachy but regular  Pulmonary/Chest: Effort normal and breath sounds normal. No respiratory distress. She has no wheezes.  No cough heard during exam  Lymphadenopathy:    She has no cervical adenopathy.  Neurological: She is alert and oriented to person, place, and time.  Skin: Skin is warm and dry.  Psychiatric: She has a normal mood and affect. Her behavior is normal. Thought content normal.  Vitals reviewed.         Assessment & Plan:  Palpitations- reviewed PCP, Cardiology, and ER notes as well as labs and imaging (negative CT angiogram).  Pt is scheduled to get holter monitor this afternoon.  Will hold off on beta blocker or other tx until her cardiology evaluation is complete.  Told pt that we will not repeat DDimer today b/c her CT angio was (-).  Will follow  along and assist as able  URI- new.  Pt's sxs are viral and no evidence of bacterial infxn on PE.  Start cough meds prn.  Reviewed supportive care and red flags that should prompt return.  Pt expressed understanding and is in agreement w/ plan.

## 2016-03-05 ENCOUNTER — Encounter: Payer: Self-pay | Admitting: Cardiology

## 2016-03-06 ENCOUNTER — Telehealth: Payer: Self-pay | Admitting: Cardiovascular Disease

## 2016-03-09 ENCOUNTER — Telehealth: Payer: Self-pay

## 2016-03-09 NOTE — Telephone Encounter (Signed)
Received incoming call from pt r/t [employer] event 03/06/16. Pt stated she did NOT pass out. Pt thinks she hit the button by accident. Pt stated she felt slight palpitations and SOB during event. Informed will forward to Dr. Elberta Fortisamnitz to advise.

## 2016-03-09 NOTE — Telephone Encounter (Signed)
Received telemetry report from [employer]. Called pt, unable to reach. Left voice message requesting return call r/t [employer] event from 03/06/16 at 7:39 PM (CT).

## 2016-03-09 NOTE — Telephone Encounter (Signed)
Called pt. Informed Dr. Elberta Fortisamnitz recommended pt to wear monitor as long as possible. Informed if symptoms get worse, please call our office. We can discontinue monitor and move up pt's appointment. Pt verbalized understanding and agreed with plan.

## 2016-03-20 ENCOUNTER — Telehealth: Payer: Self-pay | Admitting: Cardiology

## 2016-03-20 NOTE — Telephone Encounter (Signed)
Received call from [employer] re: ECG monitoring.  Pt had indicated syncope on her event monitor, which triggered [employer] to call.  Her rhythm during the time of the monitor was sinus tachycardia.  I attempted to call the patient to discuss her symptoms, but was unable to reach her.  Will attempt again.

## 2016-03-23 ENCOUNTER — Telehealth: Payer: Self-pay

## 2016-03-23 NOTE — Telephone Encounter (Signed)
[employer] sent fax of patient's rhythm from Friday at 5:39 pm and Saturday at 6:17 pm. Report state Sinus Tachycardia on both events. Patient called back and stated that she was walking and getting ready for work. Patient stated that when she pressed the monitor button that she had some pressure and pain in her chest. Will consult DOD.  Dr. Excell Seltzerooper recommend that Dr. Elberta Fortisamnitz review patient's event reports. Will forward to Dr. Elberta Fortisamnitz and his nurse. Event reports were handed off to his nurse, Sherri.

## 2016-03-23 NOTE — Telephone Encounter (Signed)
Left message for patient to call back  

## 2016-04-09 MED FILL — NORETHIN-ESTRAD-FERR 1-0.02: 1-20 | 84 days supply | Qty: 84 | Fill #1

## 2016-04-13 ENCOUNTER — Encounter: Payer: Self-pay | Admitting: Cardiology

## 2016-04-13 ENCOUNTER — Ambulatory Visit (INDEPENDENT_AMBULATORY_CARE_PROVIDER_SITE_OTHER): Payer: 59 | Admitting: Cardiology

## 2016-04-13 VITALS — BP 110/72 | HR 70 | Ht 66.0 in | Wt 165.4 lb

## 2016-04-13 DIAGNOSIS — R002 Palpitations: Secondary | ICD-10-CM | POA: Diagnosis not present

## 2016-04-13 MED ORDER — DILTIAZEM HCL ER COATED BEADS 180 MG PO CP24: 180.0000 mg | ORAL_CAPSULE | Freq: Every day | ORAL | 6 refills | Status: DC

## 2016-04-13 MED FILL — CARTIA XT 180 MG CAPSULE SA: 180 | 30 days supply | Qty: 30 | Fill #0

## 2016-04-13 NOTE — Patient Instructions (Signed)
Medication Instructions: Start Diltiazem 180 mg daily  Labwork: None ordered  Procedures/Testing: Your physician has requested that you have an echocardiogram. Echocardiography is a painless test that uses sound waves to create images of your heart. It provides your doctor with information about the size and shape of your heart and how well your heart's chambers and valves are working. This procedure takes approximately one hour. There are no restrictions for this procedure.    Follow-Up: Your physician recommends that you schedule a follow-up appointment in: 3 months with Dr. Elberta Fortisamnitz.  Any Additional Special Instructions Will Be Listed Below (If Applicable).  Echocardiogram An echocardiogram, or echocardiography, uses sound waves (ultrasound) to produce an image of your heart. The echocardiogram is simple, painless, obtained within a short period of time, and offers valuable information to your health care provider. The images from an echocardiogram can provide information such as:  Evidence of coronary artery disease (CAD).  Heart size.  Heart muscle function.  Heart valve function.  Aneurysm detection.  Evidence of a past heart attack.  Fluid buildup around the heart.  Heart muscle thickening.  Assess heart valve function. Tell a health care provider about:  Any allergies you have.  All medicines you are taking, including vitamins, herbs, eye drops, creams, and over-the-counter medicines.  Any problems you or family members have had with anesthetic medicines.  Any blood disorders you have.  Any surgeries you have had.  Any medical conditions you have.  Whether you are pregnant or may be pregnant. What happens before the procedure? No special preparation is needed. Eat and drink normally. What happens during the procedure?  In order to produce an image of your heart, gel will be applied to your chest and a wand-like tool (transducer) will be moved over your  chest. The gel will help transmit the sound waves from the transducer. The sound waves will harmlessly bounce off your heart to allow the heart images to be captured in real-time motion. These images will then be recorded.  You may need an IV to receive a medicine that improves the quality of the pictures. What happens after the procedure? You may return to your normal schedule including diet, activities, and medicines, unless your health care provider tells you otherwise. This information is not intended to replace advice given to you by your health care provider. Make sure you discuss any questions you have with your health care provider. Document Released: 02/14/2000 Document Revised: 10/05/2015 Document Reviewed: 10/24/2012 Elsevier Interactive Patient Education  2017 [employer]     If you need a refill on your cardiac medications before your next appointment, please call your pharmacy.

## 2016-04-13 NOTE — Progress Notes (Signed)
Electrophysiology Office Note   Date:  04/13/2016   ID:  Erin Goodwin, DOB [birth year], MRN [id number]  PCP:  Piedad Climes, PA-C  Primary Electrophysiologist:  Erion Hermans Jorja Loa, MD    Chief Complaint  Patient presents with  . Follow-up    Palpitations     History of Present Illness: Erin Goodwin is a 21 y.o. female who presents today for electrophysiology evaluation.   She is continuing to have episodes of palpitations. She says mainly occur when she is walking around or exerting herself. She has stopped exercising due to her symptoms. She says that she was playing laser tag with her boyfriend recently, and with fast walking her heart rate got to 190 bpm. When she is at rest she is comfortable without major symptoms. She also has noted lower extremity edema. She has started wearing compression stockings, and has significant amounts of swelling above the stockings. She says that this happens significantly more at the end of the day.  Today, she denies symptoms of palpitations, chest pain, shortness of breath, orthopnea, PND, lower extremity edema, claudication, dizziness, presyncope, syncope, bleeding, or neurologic sequela. The patient is tolerating medications without difficulties and is otherwise without complaint today.    Past Medical History:  Diagnosis Date  . Family history of early CAD   . Family history of headaches   . History of chicken pox    Past Surgical History:  Procedure Laterality Date  . TONSILLECTOMY AND ADENOIDECTOMY       Current Outpatient Prescriptions  Medication Sig Dispense Refill  . diclofenac sodium (VOLTAREN) 1 % GEL Apply 2 g topically 4 (four) times daily. 100 g 1  . hydrocortisone-pramoxine (PROCTOFOAM-HC) rectal foam Place 1 applicator rectally 2 (two) times daily as needed for hemorrhoids or itching.    Marland Kitchen ibuprofen (ADVIL,MOTRIN) 200 MG tablet Take 200 mg by mouth every 6 (six) hours as needed for moderate pain.    .  methocarbamol (ROBAXIN) 500 MG tablet Take 1 tablet (500 mg total) by mouth every 8 (eight) hours as needed for muscle spasms. 30 tablet 2  . norethindrone-ethinyl estradiol (LOESTRIN 1/20, 21,) 1-20 MG-MCG tablet Take 1 tablet by mouth daily.    Marland Kitchen diltiazem (CARDIZEM CD) 180 MG 24 hr capsule Take 1 capsule (180 mg total) by mouth daily. 30 capsule 6   No current facility-administered medications for this visit.     Allergies:   Patient has no known allergies.   Social History:  The patient  reports that she has never smoked. She has never used smokeless tobacco. She reports that she drinks alcohol. She reports that she does not use drugs.   Family History:  The patient's family history includes Cancer (age of onset: 58) in her maternal aunt and mother; Diabetes in her maternal grandfather and mother; Heart attack in her father; Heart attack (age of onset: 76) in her mother; Hyperlipidemia in her mother.    ROS:  Please see the history of present illness.   Otherwise, review of systems is positive for Fatigue, chest pressure, leg swelling, dyspnea on exertion, joint swelling, dizziness.   All other systems are reviewed and negative.    PHYSICAL EXAM: VS:  BP 110/72   Pulse 70   Ht 5\' 6"  (1.676 m)   Wt 165 lb 6.4 oz (75 kg)   BMI 26.70 kg/m  , BMI Body mass index is 26.7 kg/m. GEN: Well nourished, well developed, in no acute distress  HEENT: normal  Neck: no JVD,  carotid bruits, or masses Cardiac: RRR; no murmurs, rubs, or gallops,no edema  Respiratory:  clear to auscultation bilaterally, normal work of breathing GI: soft, nontender, nondistended, + BS MS: no deformity or atrophy  Skin: warm and dry Neuro:  Strength and sensation are intact Psych: euthymic mood, full affect  EKG:  EKG is not ordered today. Personal review of the ekg ordered 02/18/16 shows sinus rhythm  Recent Labs: 06/03/2015: ALT 12 02/18/2016: BUN 7; Creatinine, Ser 0.78; Hemoglobin 13.1; Platelets 347.0;  Potassium 3.7; Sodium 139; TSH 1.35    Lipid Panel     Component Value Date/Time   CHOL 153 06/03/2015 0833   TRIG 55.0 06/03/2015 0833   HDL 51.50 06/03/2015 0833   CHOLHDL 3 06/03/2015 0833   VLDL 11.0 06/03/2015 0833   LDLCALC 91 06/03/2015 0833     Wt Readings from Last 3 Encounters:  04/13/16 165 lb 6.4 oz (75 kg)  02/19/16 160 lb 3.2 oz (72.7 kg)  02/18/16 161 lb (73 kg)      Other studies Reviewed: Additional studies/ records that were reviewed today include: 30 day monitor 02/15/17 Sinus rhythm with sinus tachycardia, rates up to 180 bpm.  ASSESSMENT AND PLAN:  1.  Palpitations: 30 day monitor showed sinus rhythm and sinus tachycardia with rates up to 180 bpm. she is having significant amounts of fatigue and palpitations associated with her tachycardia. Due to that, we'll start her on 180 mg of diltiazem to see if we can improve her symptoms.  2. Lower extremity edema: Significant enough so that she has to wear compression stockings with edema above the stockings at the end of the day. To further diagnose whether or not she has any cardiac causes, we'll order an echocardiogram.   Current medicines are reviewed at length with the patient today.   The patient does not have concerns regarding her medicines.  The following changes were made today:  diltiazem  Labs/ tests ordered today include:  Orders Placed This Encounter  Procedures  . ECHOCARDIOGRAM COMPLETE     Disposition:   FU with Nathasha Fiorillo 3 months  Signed, Avedis Bevis Jorja LoaMartin Gilbert Manolis, MD  04/13/2016 3:16 PM     [facility] [address] [place] [address] [phone] (office) [phone] (fax)

## 2016-04-22 LAB — OB RESULTS CONSOLE GBS: GBS: NEGATIVE

## 2016-04-27 ENCOUNTER — Encounter: Payer: Self-pay | Admitting: Physician Assistant

## 2016-04-29 ENCOUNTER — Encounter: Payer: Self-pay | Admitting: Cardiology

## 2016-04-29 ENCOUNTER — Ambulatory Visit (HOSPITAL_COMMUNITY): Payer: 59 | Attending: Cardiovascular Disease

## 2016-04-29 ENCOUNTER — Other Ambulatory Visit: Payer: Self-pay

## 2016-04-29 DIAGNOSIS — Z029 Encounter for administrative examinations, unspecified: Secondary | ICD-10-CM | POA: Diagnosis present

## 2016-04-29 DIAGNOSIS — R002 Palpitations: Secondary | ICD-10-CM | POA: Diagnosis not present

## 2016-04-30 ENCOUNTER — Encounter: Payer: Self-pay | Admitting: *Deleted

## 2016-04-30 MED ORDER — DILTIAZEM HCL ER COATED BEADS 360 MG PO CP24: 360.0000 mg | ORAL_CAPSULE | Freq: Every day | ORAL | 6 refills | Status: DC

## 2016-04-30 NOTE — Telephone Encounter (Signed)
Pt agreeable to increasing Diltiazem.  She will call back if this does not make improvement in elevated HR.

## 2016-04-30 NOTE — Telephone Encounter (Signed)
lmtcb

## 2016-04-30 NOTE — Telephone Encounter (Signed)
Follow up     Pt is calling back to return your call

## 2016-04-30 NOTE — Telephone Encounter (Signed)
This encounter was created in error - please disregard.

## 2016-05-01 ENCOUNTER — Ambulatory Visit (INDEPENDENT_AMBULATORY_CARE_PROVIDER_SITE_OTHER): Payer: 59 | Admitting: Physician Assistant

## 2016-05-01 ENCOUNTER — Encounter: Payer: Self-pay | Admitting: Physician Assistant

## 2016-05-01 VITALS — BP 112/78 | HR 88 | Temp 98.3°F | Resp 14 | Ht 66.0 in | Wt 166.0 lb

## 2016-05-01 DIAGNOSIS — K644 Residual hemorrhoidal skin tags: Secondary | ICD-10-CM | POA: Diagnosis not present

## 2016-05-01 DIAGNOSIS — K219 Gastro-esophageal reflux disease without esophagitis: Secondary | ICD-10-CM

## 2016-05-01 LAB — POC HEMOCCULT BLD/STL (OFFICE/1-CARD/DIAGNOSTIC)
FECAL OCCULT BLD: NEGATIVE
OCCULT BLOOD DATE: 3022018

## 2016-05-01 LAB — URINALYSIS, ROUTINE W REFLEX MICROSCOPIC
BILIRUBIN URINE: NEGATIVE
HGB URINE DIPSTICK: NEGATIVE
Ketones, ur: NEGATIVE
LEUKOCYTES UA: NEGATIVE
Nitrite: NEGATIVE
RBC / HPF: NONE SEEN (ref 0–?)
Specific Gravity, Urine: <=1.005 — AB (ref 1.000–1.030)
Total Protein, Urine: NEGATIVE
UROBILINOGEN UA: 0.2 (ref 0.0–1.0)
Urine Glucose: NEGATIVE
WBC UA: NONE SEEN (ref 0–?)
pH: 6.5 (ref 5.0–8.0)

## 2016-05-01 LAB — CBC
HCT: 38.4 % (ref 36.0–46.0)
HEMOGLOBIN: 13.1 g/dL (ref 12.0–15.0)
MCHC: 34.1 g/dL (ref 30.0–36.0)
MCV: 94.8 fl (ref 78.0–100.0)
PLATELETS: 373 10*3/uL (ref 150.0–400.0)
RBC: 4.05 Mil/uL (ref 3.87–5.11)
RDW: 13 % (ref 11.5–15.5)
WBC: 9.5 10*3/uL (ref 4.0–10.5)

## 2016-05-01 LAB — H. PYLORI ANTIBODY, IGG: H Pylori IgG: NEGATIVE

## 2016-05-01 LAB — POCT URINE PREGNANCY: Preg Test, Ur: NEGATIVE

## 2016-05-01 MED ORDER — PANTOPRAZOLE SODIUM 40 MG PO TBEC: 40.0000 mg | DELAYED_RELEASE_TABLET | Freq: Every day | ORAL | 3 refills | Status: DC

## 2016-05-01 MED FILL — PANTOPRAZOLE SOD DR 40 MG T: 40 | 30 days supply | Qty: 30 | Fill #0

## 2016-05-01 NOTE — Progress Notes (Signed)
Pre visit review using our clinic review tool, if applicable. No additional management support is needed unless otherwise documented below in the visit note. 

## 2016-05-01 NOTE — Progress Notes (Signed)
Patient presents to clinic today c/o 1 weeks of dark stools associated with mild tenesmus. Endorses history of chronic constipation, averaging 1-2 bowel movements per week. Describes BM as hard, round balls of stool. Endorses + history of hemorrhoids, noting external hemorrhoid at present. Does not occasional BRBPR with wiping. Denies rectal pain or discomfort at rest. Notes daily heart burn with nausea, without abdominal pain, vomiting. Denies fever, chills, malaise. Recently had her diltiazem increased by Cardiology. Is wondering if some of the nausea is related to that. Patient denies alcohol consumption or NSAID use. Denies history of GI bleed. Denies change to urinary habits.   Past Medical History:  Diagnosis Date  . Family history of early CAD   . Family history of headaches   . History of chicken pox     Current Outpatient Prescriptions on File Prior to Visit  Medication Sig Dispense Refill  . diltiazem (CARDIZEM CD) 360 MG 24 hr capsule Take 1 capsule (360 mg total) by mouth daily. 30 capsule 6  . hydrocortisone-pramoxine (PROCTOFOAM-HC) rectal foam Place 1 applicator rectally 2 (two) times daily as needed for hemorrhoids or itching.    . norethindrone-ethinyl estradiol (LOESTRIN 1/20, 21,) 1-20 MG-MCG tablet Take 1 tablet by mouth daily.     No current facility-administered medications on file prior to visit.     No Known Allergies  Family History  Problem Relation Age of Onset  . Hyperlipidemia Mother   . Diabetes Mother   . Cancer Mother 80    Uterus,Skin,Thyroid  . Heart attack Mother 69  . Heart attack Father   . Cancer Maternal Aunt 30    Breast  . Diabetes Maternal Grandfather     Social History   Social History  . Marital status: Single    Spouse name: N/A  . Number of children: N/A  . Years of education: N/A   Social History Main Topics  . Smoking status: Never Smoker  . Smokeless tobacco: Never Used  . Alcohol use 0.0 oz/week     Comment: Once every  few weeks  . Drug use: No  . Sexual activity: Yes    Partners: Male   Other Topics Concern  . None   Social History Narrative  . None   Review of Systems - See HPI.  All other ROS are negative.  BP 112/78   Pulse 88   Temp 98.3 F (36.8 C) (Oral)   Resp 14   Ht '5\' 6"'  (1.676 m)   Wt 166 lb (75.3 kg)   SpO2 99%   BMI 26.79 kg/m   Physical Exam  Constitutional: She is oriented to person, place, and time and well-developed, well-nourished, and in no distress.  HENT:  Head: Normocephalic and atraumatic.  Eyes: Conjunctivae are normal.  Neck: Neck supple.  Cardiovascular: Normal rate, regular rhythm, normal heart sounds and intact distal pulses.   Pulmonary/Chest: Effort normal and breath sounds normal. No respiratory distress. She has no wheezes. She has no rales. She exhibits no tenderness.  Abdominal: Soft. Bowel sounds are normal. She exhibits no distension and no mass. There is no tenderness. There is no rebound and no guarding.  Genitourinary: Rectal exam shows external hemorrhoid. Rectal exam shows no internal hemorrhoid, no fissure and guaiac negative stool.  Genitourinary Comments: External hemorrhoid noted without thrombosis.   Lymphadenopathy:    She has no cervical adenopathy.  Neurological: She is alert and oriented to person, place, and time.  Skin: Skin is warm and dry. No rash  noted.  Psychiatric: Affect normal.  Vitals reviewed.   Recent Results (from the past 2160 hour(s))  CBC     Status: None   Collection Time: 02/18/16 10:00 AM  Result Value Ref Range   WBC 6.6 4.0 - 10.5 K/uL   RBC 4.08 3.87 - 5.11 Mil/uL   Platelets 347.0 150.0 - 400.0 K/uL   Hemoglobin 13.1 12.0 - 15.0 g/dL   HCT 38.1 36.0 - 46.0 %   MCV 93.3 78.0 - 100.0 fl   MCHC 34.3 30.0 - 36.0 g/dL   RDW 12.6 11.5 - 55.3 %  Basic metabolic panel     Status: Abnormal   Collection Time: 02/18/16 10:00 AM  Result Value Ref Range   Sodium 139 135 - 145 mEq/L   Potassium 3.7 3.5 - 5.1 mEq/L    Chloride 105 96 - 112 mEq/L   CO2 27 19 - 32 mEq/L   Glucose, Bld 113 (H) 70 - 99 mg/dL   BUN 7 6 - 23 mg/dL   Creatinine, Ser 0.78 0.40 - 1.20 mg/dL   Calcium 9.5 8.4 - 10.5 mg/dL   GFR 97.94 >60.00 mL/min  TSH     Status: None   Collection Time: 02/18/16 10:00 AM  Result Value Ref Range   TSH 1.35 0.35 - 4.50 uIU/mL  T4, free     Status: None   Collection Time: 02/18/16 10:00 AM  Result Value Ref Range   Free T4 0.86 0.60 - 1.60 ng/dL    Comment: Specimens from patients who are undergoing biotin therapy and /or ingesting biotin supplements may contain high levels of biotin.  The higher biotin concentration in these specimens interferes with this Free T4 assay.  Specimens that contain high levels  of biotin may cause false high results for this Free T4 assay.  Please interpret results in light of the total clinical presentation of the patient.    D-dimer, quantitative (not at [facility])     Status: Abnormal   Collection Time: 02/20/16  9:30 PM  Result Value Ref Range   D-Dimer, Quant 0.60 (H) 0.00 - 0.50 ug/mL-FEU    Comment: (NOTE) At the manufacturer cut-off of 0.50 ug/mL FEU, this assay has been documented to exclude PE with a sensitivity and negative predictive value of 97 to 99%.  At this time, this assay has not been approved by the FDA to exclude DVT/VTE. Results should be correlated with clinical presentation.   Pregnancy, urine     Status: None   Collection Time: 02/20/16  9:55 PM  Result Value Ref Range   Preg Test, Ur NEGATIVE NEGATIVE    Comment:        THE SENSITIVITY OF THIS METHODOLOGY IS >20 mIU/mL.   POC Hemoccult Bld/Stl (1-Cd Office Dx)     Status: Normal   Collection Time: 05/01/16  9:27 AM  Result Value Ref Range   Card #1 Date [id number]    Fecal Occult Blood, POC Negative Negative  POCT urine pregnancy     Status: Normal   Collection Time: 05/01/16  9:33 AM  Result Value Ref Range   Preg Test, Ur Negative Negative    Assessment/Plan: 1. Hemorrhoids,  external Hemoccult in office negative. Will send home with IFOB kit. External hemorrhoid palpable without thrombosis. 2/2 strain from constipation. Supportive measures and bowel regimen reviewed. Patient has rx for proctofoam at home. Use as directed. Referral to GI placed giving history of hemorrhoids.  - POC Hemoccult Bld/Stl (1-Cd Office Dx)  2. Gastroesophageal  reflux disease without esophagitis With nausea. Exam unremarkable. Start GERD diet. Rx Protonix. Patient encouraged to start a daily probiotic. FU scheduled.  - CBC - H. pylori antibody, IgG - Urinalysis, Routine w reflex microscopic - POCT urine pregnancy   Leeanne Rio, PA-C

## 2016-05-01 NOTE — Patient Instructions (Signed)
Please start the Protonix daily. Increase fluids.   I encourage you to increase hydration and the amount of fiber in your diet.  Start a daily probiotic (Align, Culturelle, Digestive Advantage, etc.). If no bowel movement within 24 hours, take 2 Tbs of Milk of Magnesia in a 4 oz glass of warmed prune juice every 2-3 days to help promote bowel movement. If no results within 24 hours, then repeat above regimen, adding a Dulcolax stool softener to regimen. If this does not promote a bowel movement, please call the office.  I am setting you up with GI for further assessment of symptoms. Also sending you home with a home hemoccult test to mail in to our lab since in-office test was negative today.   I will call you with your lab results.   Food Choices for Gastroesophageal Reflux Disease, Adult When you have gastroesophageal reflux disease (GERD), the foods you eat and your eating habits are very important. Choosing the right foods can help ease your discomfort. What guidelines do I need to follow?  Choose fruits, vegetables, whole grains, and low-fat dairy products.  Choose low-fat meat, fish, and poultry.  Limit fats such as oils, salad dressings, butter, nuts, and avocado.  Keep a food diary. This helps you identify foods that cause symptoms.  Avoid foods that cause symptoms. These may be different for everyone.  Eat small meals often instead of 3 large meals a day.  Eat your meals slowly, in a place where you are relaxed.  Limit fried foods.  Cook foods using methods other than frying.  Avoid drinking alcohol.  Avoid drinking large amounts of liquids with your meals.  Avoid bending over or lying down until 2-3 hours after eating. What foods are not recommended? These are some foods and drinks that may make your symptoms worse: Vegetables  Tomatoes. Tomato juice. Tomato and spaghetti sauce. Chili peppers. Onion and garlic. Horseradish. Fruits  Oranges, grapefruit, and lemon  (fruit and juice). Meats  High-fat meats, fish, and poultry. This includes hot dogs, ribs, ham, sausage, salami, and bacon. Dairy  Whole milk and chocolate milk. Sour cream. Cream. Butter. Ice cream. Cream cheese. Drinks  Coffee and tea. Bubbly (carbonated) drinks or energy drinks. Condiments  Hot sauce. Barbecue sauce. Sweets/Desserts  Chocolate and cocoa. Donuts. Peppermint and spearmint. Fats and Oils  High-fat foods. This includes [place] fries and potato chips. Other  Vinegar. Strong spices. This includes black pepper, white pepper, red pepper, cayenne, curry powder, cloves, ginger, and chili powder. The items listed above may not be a complete list of foods and drinks to avoid. Contact your dietitian for more information.  This information is not intended to replace advice given to you by your health care provider. Make sure you discuss any questions you have with your health care provider. Document Released: 08/18/2011 Document Revised: 07/25/2015 Document Reviewed: 12/21/2012 Elsevier Interactive Patient Education  2017 [employer]

## 2016-05-02 ENCOUNTER — Encounter: Payer: Self-pay | Admitting: Cardiology

## 2016-05-04 ENCOUNTER — Encounter: Payer: Self-pay | Admitting: Internal Medicine

## 2016-05-04 NOTE — Telephone Encounter (Signed)
Left message for patient to call our office regarding MyChart message

## 2016-05-04 NOTE — Telephone Encounter (Signed)
Spoke with patient who states she could not tolerate diltiazem 360 mg due to feeling shaky and nauseated for hours. She was previously taking diltiazem 180 mg but has since stopped that as well because she has not seen any improvement in her heart rate. She states she continues to have heart rate of > 150 bpm if she gets up and moves around. She states heart rate normallizes when she sits or lays down. I advised that I will send message to Dr. Elberta Fortisamnitz for advice. She verbalized understanding and agreement with plan and thanked me for the call.

## 2016-05-05 ENCOUNTER — Encounter: Payer: Self-pay | Admitting: Internal Medicine

## 2016-05-05 NOTE — Telephone Encounter (Signed)
Called patient to follow up.  Offered to switch to metoprolol to see if she has less SE on different medication.  Pt does not want to start anything new at this time.  She would like sooner appt to discuss w/ Camnitz.  Appt made for 05/21/16 w/ Camnitz.   Advised to call office if she begins to experience issues and needs advisement before next OV. She thanks me for following up.

## 2016-05-13 ENCOUNTER — Encounter: Payer: Self-pay | Admitting: Cardiology

## 2016-05-21 ENCOUNTER — Encounter: Payer: Self-pay | Admitting: Cardiology

## 2016-05-21 ENCOUNTER — Ambulatory Visit (INDEPENDENT_AMBULATORY_CARE_PROVIDER_SITE_OTHER): Payer: 59 | Admitting: Cardiology

## 2016-05-21 VITALS — BP 120/84 | HR 76 | Ht 66.0 in | Wt 166.6 lb

## 2016-05-21 DIAGNOSIS — R002 Palpitations: Secondary | ICD-10-CM

## 2016-05-21 MED ORDER — METOPROLOL TARTRATE 25 MG PO TABS: 25.0000 mg | ORAL_TABLET | Freq: Two times a day (BID) | ORAL | 3 refills | Status: DC

## 2016-05-21 MED FILL — METOPROLOL TARTRATE 25 MG T: 25 | 30 days supply | Qty: 60 | Fill #0

## 2016-05-21 NOTE — Patient Instructions (Signed)
Medication Instructions:   Your physician has recommended you make the following change in your medication: 1) START Metoprolol 25 mg twice daily  --- If you need a refill on your cardiac medications before your next appointment, please call your pharmacy. ---  Labwork:  None ordered  Testing/Procedures:  None ordered  Follow-Up:  Your physician recommends that you schedule a follow-up appointment in: 3 months with Dr. Elberta Fortis.  Thank you for choosing [facility]!!   Erin Horn, RN [phone]  Any Other Special Instructions Will Be Listed Below (If Applicable).  Please review the POTS booklet given to you today. Websites:  ndrf.org  potsplace.com   Metoprolol tablets What is this medicine? METOPROLOL (me TOE proe lole) is a beta-blocker. Beta-blockers reduce the workload on the heart and help it to beat more regularly. This medicine is used to treat high blood pressure and to prevent chest pain. It is also used to after a heart attack and to prevent an additional heart attack from occurring. This medicine may be used for other purposes; ask your health care provider or pharmacist if you have questions. COMMON BRAND NAME(S): Lopressor What should I tell my health care provider before I take this medicine? They need to know if you have any of these conditions: -diabetes -heart or vessel disease like slow heart rate, worsening heart failure, heart block, sick sinus syndrome or Raynaud's disease -kidney disease -liver disease -lung or breathing disease, like asthma or emphysema -pheochromocytoma -thyroid disease -an unusual or allergic reaction to metoprolol, other beta-blockers, medicines, foods, dyes, or preservatives -pregnant or trying to get pregnant -breast-feeding How should I use this medicine? Take this medicine by mouth with a drink of water. Follow the directions on the prescription label. Take this medicine immediately after meals. Take your doses  at regular intervals. Do not take more medicine than directed. Do not stop taking this medicine suddenly. This could lead to serious heart-related effects. Talk to your pediatrician regarding the use of this medicine in children. Special care may be needed. Overdosage: If you think you have taken too much of this medicine contact a poison control center or emergency room at once. NOTE: This medicine is only for you. Do not share this medicine with others. What if I miss a dose? If you miss a dose, take it as soon as you can. If it is almost time for your next dose, take only that dose. Do not take double or extra doses. What may interact with this medicine? This medicine may interact with the following medications: -certain medicines for blood pressure, heart disease, irregular heart beat -certain medicines for depression like monoamine oxidase (MAO) inhibitors, fluoxetine, or paroxetine -clonidine -dobutamine -epinephrine -isoproterenol -reserpine This list may not describe all possible interactions. Give your health care provider a list of all the medicines, herbs, non-prescription drugs, or dietary supplements you use. Also tell them if you smoke, drink alcohol, or use illegal drugs. Some items may interact with your medicine. What should I watch for while using this medicine? Visit your doctor or health care professional for regular check ups. Contact your doctor right away if your symptoms worsen. Check your blood pressure and pulse rate regularly. Ask your health care professional what your blood pressure and pulse rate should be, and when you should contact them. You may get drowsy or dizzy. Do not drive, use machinery, or do anything that needs mental alertness until you know how this medicine affects you. Do not sit or stand up  quickly, especially if you are an older patient. This reduces the risk of dizzy or fainting spells. Contact your doctor if these symptoms continue. Alcohol may  interfere with the effect of this medicine. Avoid alcoholic drinks. What side effects may I notice from receiving this medicine? Side effects that you should report to your doctor or health care professional as soon as possible: -allergic reactions like skin rash, itching or hives -cold or numb hands or feet -depression -difficulty breathing -faint -fever with sore throat -irregular heartbeat, chest pain -rapid weight gain -swollen legs or ankles Side effects that usually do not require medical attention (report to your doctor or health care professional if they continue or are bothersome): -anxiety or nervousness -change in sex drive or performance -dry skin -headache -nightmares or trouble sleeping -short term memory loss -stomach upset or diarrhea -unusually tired This list may not describe all possible side effects. Call your doctor for medical advice about side effects. You may report side effects to FDA at 1-800-FDA-1088. Where should I keep my medicine? Keep out of the reach of children. Store at room temperature between 15 and 30 degrees C (59 and 86 degrees F). Throw away any unused medicine after the expiration date. NOTE: This sheet is a summary. It may not cover all possible information. If you have questions about this medicine, talk to your doctor, pharmacist, or health care provider.  2018 Elsevier/Gold Standard (2012-10-21 14:40:36)

## 2016-05-21 NOTE — Progress Notes (Signed)
Electrophysiology Office Note   Date:  05/21/2016   ID:  Erin Goodwin, DOB March 12, 1995, MRN [id number]  PCP:  Piedad ClimesMartin, Erin Cody, Erin Goodwin  Primary Electrophysiologist:  Erin Santoli Jorja LoaMartin Aneesa Romey, MD    Chief Complaint  Patient presents with  . Follow-up    Palpitations     History of Present Illness: Erin Goodwin is a 21 y.o. female who presents today for electrophysiology evaluation.   She is continuing to have episodes of palpitations. She says mainly occur when she is walking around or exerting herself. She has stopped exercising due to her symptoms. She says that she was playing laser tag with her boyfriend recently, and with fast walking her heart rate got to 190 bpm. When she is at rest she is comfortable without major symptoms. Recently, she was bringing groceries in from her car to her first for apartment and her heart rate went to 170s. She says that she can feel palpitations when her heart rate is above 150. She also has symptoms of fatigue.  Today, she denies symptoms of palpitations, chest pain, shortness of breath, orthopnea, PND, lower extremity edema, claudication, dizziness, presyncope, syncope, bleeding, or neurologic sequela. The patient is tolerating medications without difficulties and is otherwise without complaint today.    Past Medical History:  Diagnosis Date  . External hemorrhoids   . Family history of early CAD   . Family history of headaches   . GERD (gastroesophageal reflux disease)   . Hirsutism   . History of chicken pox   . Hypotension   . Palpitations    Past Surgical History:  Procedure Laterality Date  . TONSILLECTOMY AND ADENOIDECTOMY       Current Outpatient Prescriptions  Medication Sig Dispense Refill  . hydrocortisone-pramoxine (PROCTOFOAM-HC) rectal foam Place 1 applicator rectally 2 (two) times daily as needed for hemorrhoids or itching.    . methocarbamol (ROBAXIN) 500 MG tablet Take 500 mg by mouth as needed for muscle spasms.    .  norethindrone-ethinyl estradiol (LOESTRIN 1/20, 21,) 1-20 MG-MCG tablet Take 1 tablet by mouth daily.    . pantoprazole (PROTONIX) 40 MG tablet Take 1 tablet (40 mg total) by mouth daily. 30 tablet 3   No current facility-administered medications for this visit.     Allergies:   Patient has no known allergies.   Social History:  The patient  reports that she has never smoked. She has never used smokeless tobacco. She reports that she drinks alcohol. She reports that she does not use drugs.   Family History:  The patient's family history includes Cancer (age of onset: 530) in her maternal aunt and mother; Diabetes in her maternal grandfather and mother; Heart attack in her father; Heart attack (age of onset: 6640) in her mother; Hyperlipidemia in her mother.    ROS:  Please see the history of present illness.   Otherwise, review of systems is positive for fatigue.   All other systems are reviewed and negative.    PHYSICAL EXAM: VS:  BP 120/84   Pulse 76   Ht 5\' 6"  (1.676 m)   Wt 166 lb 9.6 oz (75.6 kg)   BMI 26.89 kg/m  , BMI Body mass index is 26.89 kg/m. GEN: Well nourished, well developed, in no acute distress  HEENT: normal  Neck: no JVD, carotid bruits, or masses Cardiac: RRR; no murmurs, rubs, or gallops,no edema  Respiratory:  clear to auscultation bilaterally, normal work of breathing GI: soft, nontender, nondistended, + BS MS: no deformity or  atrophy  Skin: warm and dry Neuro:  Strength and sensation are intact Psych: euthymic mood, full affect  EKG:  EKG is not ordered today. Personal review of the ekg ordered 02/18/16 shows sinus rhythm  Recent Labs: 06/03/2015: ALT 12 02/18/2016: BUN 7; Creatinine, Ser 0.78; Potassium 3.7; Sodium 139; TSH 1.35 05/01/2016: Hemoglobin 13.1; Platelets 373.0    Lipid Panel     Component Value Date/Time   CHOL 153 06/03/2015 0833   TRIG 55.0 06/03/2015 0833   HDL 51.50 06/03/2015 0833   CHOLHDL 3 06/03/2015 0833   VLDL 11.0  06/03/2015 0833   LDLCALC 91 06/03/2015 0833     Wt Readings from Last 3 Encounters:  05/21/16 166 lb 9.6 oz (75.6 kg)  05/01/16 166 lb (75.3 kg)  04/13/16 165 lb 6.4 oz (75 kg)      Other studies Reviewed: Additional studies/ records that were reviewed today include: 30 day monitor 02/15/17 Sinus rhythm with sinus tachycardia, rates up to 180 bpm.  ASSESSMENT AND PLAN:  1.  Palpitations: 30 day monitor showed sinus rhythm and sinus tachycardia with rates up to 180 bpm. Currently, it is possibly be either pots or inappropriate sinus tachycardia. We'll try her on 25 mg of metoprolol and give her information on both of these disease processes.  2. Lower extremity edema: Has improved since last being seen. No current therapy at this time.   Current medicines are reviewed at length with the patient today.   The patient does not have concerns regarding her medicines.  The following changes were made today:  diltiazem  Labs/ tests ordered today include:  No orders of the defined types were placed in this encounter.    Disposition:   FU with Orra Nolde 3 months  Signed, Rawson Minix Jorja Loa, MD  05/21/2016 8:54 AM     [facility] [address] [place] [address] [phone] (office) [phone] (fax)

## 2016-05-28 ENCOUNTER — Ambulatory Visit (INDEPENDENT_AMBULATORY_CARE_PROVIDER_SITE_OTHER): Payer: 59 | Admitting: Internal Medicine

## 2016-05-28 ENCOUNTER — Telehealth: Payer: Self-pay | Admitting: Internal Medicine

## 2016-05-28 ENCOUNTER — Encounter: Payer: Self-pay | Admitting: Internal Medicine

## 2016-05-28 VITALS — BP 118/76 | HR 86 | Ht 66.0 in | Wt 167.0 lb

## 2016-05-28 DIAGNOSIS — K601 Chronic anal fissure: Secondary | ICD-10-CM

## 2016-05-28 DIAGNOSIS — K5909 Other constipation: Secondary | ICD-10-CM

## 2016-05-28 MED ORDER — LINACLOTIDE 145 MCG PO CAPS: 145.0000 ug | ORAL_CAPSULE | Freq: Every day | ORAL | 0 refills | Status: DC

## 2016-05-28 MED ORDER — DILTIAZEM GEL 2 %: 1.0000 "application " | Freq: Two times a day (BID) | CUTANEOUS | 2 refills | Status: DC

## 2016-05-28 MED FILL — PROCTOFOAM-HC 1%-1% FOAM: 1-1 | 30 days supply | Qty: 10 | Fill #0

## 2016-05-28 NOTE — Progress Notes (Signed)
Erin Goodwin 21 y.o. 04-Aug-1995 [id number] Referred by: Waldon MerlMartin, William C, PA-C  Assessment & Plan:   Encounter Diagnoses  Name Primary?  . Chronic constipation Yes  . Chronic posterior anal fissure     Sounds like a chronic slow transit constipation issue most likely. Sometimes fiber makes these people worse. I think it is worthwhile increasing fiber in her diet so we'll give her diet information sheet about that and I'm going to go ahead and start Linzess 145 ug qd. Discussed side effects.  Diltiazem gel 2% twice a day for anal fissure she will pick this up next week and she has to wait till payday  RTC 2 mos or reassessment and more long-term planning  I appreciate the opportunity to care for this patient. 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 Once or twice a week with hard stools that are difficult to eliminate. Lately she's been having some intermittent mild variable abdominal cramps or pains. She has chronic intermittent rectal bleeding and also has swollen irritated and sore hemorrhoids at times perhaps thrombosed. Proctosol Proctofoam has helped in the past. She probably doesn't eat much fiber. She has tried daily MiraLAX without any change in her symptoms. Her mother has IBS constipation predominant and was recently started on Linzess. No Known Allergies Current Meds  Medication Sig  . hydrocortisone-pramoxine (PROCTOFOAM-HC) rectal foam Place 1 applicator rectally 2 (two) times daily as needed for hemorrhoids or itching.  . methocarbamol (ROBAXIN) 500 MG tablet Take 500 mg by mouth as needed for muscle spasms.  . metoprolol tartrate (LOPRESSOR) 25 MG tablet Take 1 tablet (25 mg total) by mouth 2 (two) times daily.  .  norethindrone-ethinyl estradiol (LOESTRIN 1/20, 21,) 1-20 MG-MCG tablet Take 1 tablet by mouth daily.  . pantoprazole (PROTONIX) 40 MG tablet Take 1 tablet (40 mg total) by mouth daily.   Past Medical History:  Diagnosis Date  . External hemorrhoids   . Family history of early CAD   . Family history of headaches   . GERD (gastroesophageal reflux disease)   . Hirsutism   . History of chicken pox   . Hypotension   . Palpitations    Past Surgical History:  Procedure Laterality Date  . TONSILLECTOMY AND ADENOIDECTOMY     Social History   Social History  . Marital status: Single    Spouse name: N/A  . Number of children: N/A  . Years of education: N/A   Social History Main Topics  . Smoking status: Never Smoker  . Smokeless tobacco: Never Used  . Alcohol use 0.0 oz/week     Comment: Once every few weeks  . Drug use: No  . Sexual activity: Yes    Partners: Male   Other Topics Concern  . None   Social History Narrative   single, no children   [facility] [place]    had a photography business in the past   One caffeinated beverage daily   05/28/2016      family history includes Cancer (age of onset: 6330) in her maternal aunt and mother; Diabetes in her maternal grandfather and mother; Heart attack in her father; Heart attack (age of onset: 5140) in her mother; Hyperlipidemia in her mother.    Review of Systems  Positive for fatigue myalgias some mild pedal edema recently. All other review of systems negative. Or as per history of present illness.  Objective:   Physical Exam @BP  118/76   Pulse 86   Ht 5\' 6"  (1.676 m)   Wt 167 lb (75.8 kg)   SpO2 99%   BMI 26.95 kg/m @  General:  Well-developed, well-nourished and in no acute distress Eyes:  anicteric. ENT:   Mouth and posterior pharynx free of lesions.  Neck:   supple w/o thyromegaly or mass.  Lungs: Clear to auscultation bilaterally. Heart:  S1S2, no rubs, murmurs, gallops. Abdomen:  soft, non-tender, no  hepatosplenomegaly, hernia, or mass and BS+.  Rectal: Zella Ball , CMA present.  Small violaceous nodule post Increased resting tone, tender and indurated posterior, firm brown stool Normal voluntary squeeze and appropriate abdominal contraction and descent with simulated defecation  Anoscopy: Gr 2 inflamed int hemorrhoids and post fissure  Lymph:  no cervical or supraclavicular adenopathy. Extremities:   no edema, cyanosis or clubbing Skin   no rash. Neuro:  A&O x 3.  Psych:  appropriate mood and  Affect.   Data Reviewed:  Lab Results  Component Value Date   WBC 9.5 05/01/2016   HGB 13.1 05/01/2016   HCT 38.4 05/01/2016   MCV 94.8 05/01/2016   PLT 373.0 05/01/2016   Lab Results  Component Value Date   TSH 1.35 02/18/2016   Recent PCP note

## 2016-05-28 NOTE — Telephone Encounter (Signed)
I called her and was going to try Amitiza but that is not covered at all  Looks like she has a high deductible on the Linzess  Go ahead and giver her some samples and we will see if it works first and go from there

## 2016-05-28 NOTE — Patient Instructions (Signed)
Today we are giving you a handout on anal fissures to read.   We are providing you with a printed rx for Diltiazem gel [facility].   We have sent the following medications to your pharmacy for you to pick up at your convenience: Linzess, coupon card provided.   Today we are giving you a high fiber handout to read and follow.   Please follow up with Dr Leone PayorGessner in 2 months.     I appreciate the opportunity to care for you. Stan Headarl Gessner, MD, [facility]

## 2016-05-28 NOTE — Telephone Encounter (Signed)
Please advise Sir? 

## 2016-05-28 NOTE — Telephone Encounter (Signed)
Patient going to come after work and pick up samples of the Linzess to try.

## 2016-06-01 ENCOUNTER — Encounter: Payer: Self-pay | Admitting: Internal Medicine

## 2016-06-02 ENCOUNTER — Other Ambulatory Visit: Payer: Self-pay

## 2016-06-02 MED ORDER — LINACLOTIDE 145 MCG PO CAPS: 145.0000 ug | ORAL_CAPSULE | Freq: Every day | ORAL | 3 refills | Status: DC

## 2016-06-13 ENCOUNTER — Encounter: Payer: Self-pay | Admitting: Physician Assistant

## 2016-06-19 ENCOUNTER — Ambulatory Visit (INDEPENDENT_AMBULATORY_CARE_PROVIDER_SITE_OTHER): Payer: 59 | Admitting: Internal Medicine

## 2016-06-19 VITALS — BP 102/68 | HR 86 | Temp 97.7°F | Resp 16 | Ht 66.0 in | Wt 165.8 lb

## 2016-06-19 DIAGNOSIS — R002 Palpitations: Secondary | ICD-10-CM

## 2016-06-19 NOTE — Patient Instructions (Addendum)
Please stop at the lab.  If the results are negative, consider changing the birth control.  If palpitations persist, consider a tilt test (by cardiology).

## 2016-06-19 NOTE — Progress Notes (Signed)
Pre visit review using our clinic review tool, if applicable. No additional management support is needed unless otherwise documented below in the visit note. 
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
## 2016-06-23 LAB — METANEPHRINES, PLASMA
Metanephrine, Free: <25 pg/mL (ref <=57)
NORMETANEPHRINE FREE: 61 pg/mL (ref <=148)
Total Metanephrines-Plasma: 61 pg/mL (ref <=205)

## 2016-06-24 ENCOUNTER — Encounter: Payer: Self-pay | Admitting: Physician Assistant

## 2016-06-24 ENCOUNTER — Telehealth: Payer: Self-pay

## 2016-06-24 ENCOUNTER — Ambulatory Visit (INDEPENDENT_AMBULATORY_CARE_PROVIDER_SITE_OTHER): Payer: 59 | Admitting: Physician Assistant

## 2016-06-24 VITALS — BP 106/72 | HR 67 | Temp 98.5°F | Resp 14 | Ht 66.0 in | Wt 165.0 lb

## 2016-06-24 DIAGNOSIS — Z0000 Encounter for general adult medical examination without abnormal findings: Secondary | ICD-10-CM | POA: Diagnosis not present

## 2016-06-24 LAB — URINALYSIS, ROUTINE W REFLEX MICROSCOPIC
BILIRUBIN URINE: NEGATIVE
Hgb urine dipstick: NEGATIVE
KETONES UR: NEGATIVE
LEUKOCYTES UA: NEGATIVE
NITRITE: NEGATIVE
Specific Gravity, Urine: >=1.03 — AB (ref 1.000–1.030)
Total Protein, Urine: NEGATIVE
URINE GLUCOSE: NEGATIVE
UROBILINOGEN UA: 0.2 (ref 0.0–1.0)
pH: 6 (ref 5.0–8.0)

## 2016-06-24 LAB — LIPID PANEL
CHOLESTEROL: 187 mg/dL (ref 0–200)
HDL: 41.5 mg/dL (ref >39.00)
LDL Cholesterol: 126 mg/dL — ABNORMAL HIGH (ref 0–99)
NONHDL: 145.97
TRIGLYCERIDES: 98 mg/dL (ref 0.0–149.0)
Total CHOL/HDL Ratio: 5
VLDL: 19.6 mg/dL (ref 0.0–40.0)

## 2016-06-24 LAB — CATECHOLAMINES, FRACTIONATED, PLASMA
Catecholamines, Total: 406 pg/mL
Epinephrine: 23 pg/mL
Norepinephrine: 383 pg/mL

## 2016-06-24 LAB — COMPREHENSIVE METABOLIC PANEL
ALBUMIN: 4.1 g/dL (ref 3.5–5.2)
ALK PHOS: 42 U/L (ref 39–117)
ALT: 9 U/L (ref 0–35)
AST: 14 U/L (ref 0–37)
BILIRUBIN TOTAL: 0.3 mg/dL (ref 0.2–1.2)
BUN: 7 mg/dL (ref 6–23)
CALCIUM: 9.7 mg/dL (ref 8.4–10.5)
CO2: 28 mEq/L (ref 19–32)
Chloride: 105 mEq/L (ref 96–112)
Creatinine, Ser: 0.75 mg/dL (ref 0.40–1.20)
GFR: 102.15 mL/min (ref >60.00)
Glucose, Bld: 90 mg/dL (ref 70–99)
POTASSIUM: 3.8 meq/L (ref 3.5–5.1)
Sodium: 140 mEq/L (ref 135–145)
TOTAL PROTEIN: 7 g/dL (ref 6.0–8.3)

## 2016-06-24 LAB — CBC
HCT: 40.6 % (ref 36.0–46.0)
Hemoglobin: 13.8 g/dL (ref 12.0–15.0)
MCHC: 34 g/dL (ref 30.0–36.0)
MCV: 94.7 fl (ref 78.0–100.0)
Platelets: 380 10*3/uL (ref 150.0–400.0)
RBC: 4.29 Mil/uL (ref 3.87–5.11)
RDW: 12.8 % (ref 11.5–15.5)
WBC: 8.5 10*3/uL (ref 4.0–10.5)

## 2016-06-24 LAB — TSH: TSH: 1.27 u[IU]/mL (ref 0.35–4.50)

## 2016-06-24 MED ORDER — METHOCARBAMOL 500 MG PO TABS: 500.0000 mg | ORAL_TABLET | ORAL | 0 refills | Status: DC | PRN

## 2016-06-24 NOTE — Assessment & Plan Note (Signed)
Depression screen negative. Health Maintenance reviewed -- up-to-date. Preventive schedule discussed and handout given in AVS. Will obtain fasting labs today.  

## 2016-06-24 NOTE — Telephone Encounter (Signed)
-----   Message from Frances Furbish sent at 06/24/2016  8:10 AM EDT ----- Good Morning,  Faith calling from [employer] regarding Linzess request for patient states they cannot send medication to patient's home and need to verify if they can send medication to doctor's office instead best call back # is [phone] Opt 3   Thank you, Erie Noe

## 2016-06-24 NOTE — Progress Notes (Signed)
Pre visit review using our clinic review tool, if applicable. No additional management support is needed unless otherwise documented below in the visit note. 

## 2016-06-24 NOTE — Patient Instructions (Signed)
Please go to the lab for blood work.   Our office will call you with your results unless you have chosen to receive results via MyChart.  If your blood work is normal we will follow-up each year for physicals and as scheduled for chronic medical problems.  If anything is abnormal we will treat accordingly and get you in for a follow-up.   Preventive Care 18-39 Years, Female Preventive care refers to lifestyle choices and visits with your health care provider that can promote health and wellness. What does preventive care include?  A yearly physical exam. This is also called an annual well check.  Dental exams once or twice a year.  Routine eye exams. Ask your health care provider how often you should have your eyes checked.  Personal lifestyle choices, including: ? Daily care of your teeth and gums. ? Regular physical activity. ? Eating a healthy diet. ? Avoiding tobacco and drug use. ? Limiting alcohol use. ? Practicing safe sex. ? Taking vitamin and mineral supplements as recommended by your health care provider. What happens during an annual well check? The services and screenings done by your health care provider during your annual well check will depend on your age, overall health, lifestyle risk factors, and family history of disease. Counseling Your health care provider may ask you questions about your:  Alcohol use.  Tobacco use.  Drug use.  Emotional well-being.  Home and relationship well-being.  Sexual activity.  Eating habits.  Work and work environment.  Method of birth control.  Menstrual cycle.  Pregnancy history.  Screening You may have the following tests or measurements:  Height, weight, and BMI.  Diabetes screening. This is done by checking your blood sugar (glucose) after you have not eaten for a while (fasting).  Blood pressure.  Lipid and cholesterol levels. These may be checked every 5 years starting at age 20.  Skin  check.  Hepatitis C blood test.  Hepatitis B blood test.  Sexually transmitted disease (STD) testing.  BRCA-related cancer screening. This may be done if you have a family history of breast, ovarian, tubal, or peritoneal cancers.  Pelvic exam and Pap test. This may be done every 3 years starting at age 21. Starting at age 30, this may be done every 5 years if you have a Pap test in combination with an HPV test.  Discuss your test results, treatment options, and if necessary, the need for more tests with your health care provider. Vaccines Your health care provider may recommend certain vaccines, such as:  Influenza vaccine. This is recommended every year.  Tetanus, diphtheria, and acellular pertussis (Tdap, Td) vaccine. You may need a Td booster every 10 years.  Varicella vaccine. You may need this if you have not been vaccinated.  HPV vaccine. If you are 26 or younger, you may need three doses over 6 months.  Measles, mumps, and rubella (MMR) vaccine. You may need at least one dose of MMR. You may also need a second dose.  Pneumococcal 13-valent conjugate (PCV13) vaccine. You may need this if you have certain conditions and were not previously vaccinated.  Pneumococcal polysaccharide (PPSV23) vaccine. You may need one or two doses if you smoke cigarettes or if you have certain conditions.  Meningococcal vaccine. One dose is recommended if you are age 19-21 years and a first-year college student living in a residence hall, or if you have one of several medical conditions. You may also need additional booster doses.  Hepatitis A vaccine.   You may need this if you have certain conditions or if you travel or work in places where you may be exposed to hepatitis A.  Hepatitis B vaccine. You may need this if you have certain conditions or if you travel or work in places where you may be exposed to hepatitis B.  Haemophilus influenzae type b (Hib) vaccine. You may need this if you have  certain risk factors.  Talk to your health care provider about which screenings and vaccines you need and how often you need them. This information is not intended to replace advice given to you by your health care provider. Make sure you discuss any questions you have with your health care provider. Document Released: 04/14/2001 Document Revised: 11/06/2015 Document Reviewed: 12/18/2014 Elsevier Interactive Patient Education  2017 [employer] .      

## 2016-06-24 NOTE — Progress Notes (Signed)
Patient presents to clinic today for annual exam.  Patient is fasting for labs. Denies acute concerns today at today's visit.   Diet -- Is working on balancing her diet. Is working on portion sizes and making healthier choices. Is adding in vegetables.   Exercise -- Is staying very active at work -- 12 hour shifts and walking the entire time.  Health Maintenance: Immunizations -- up-to-date PAP -- up-to-date. Followed by GYN.  Past Medical History:  Diagnosis Date  . External hemorrhoids   . Family history of early CAD   . Family history of headaches   . GERD (gastroesophageal reflux disease)   . Hirsutism   . History of chicken pox   . Hypotension   . Palpitations     Past Surgical History:  Procedure Laterality Date  . TONSILLECTOMY AND ADENOIDECTOMY      Current Outpatient Prescriptions on File Prior to Visit  Medication Sig Dispense Refill  . diltiazem 2 % GEL Apply 1 application topically 2 (two) times daily. Into anal canal 30 g 2  . linaclotide (LINZESS) 145 MCG CAPS capsule Take 1 capsule (145 mcg total) by mouth daily before breakfast. 90 capsule 3  . metoprolol tartrate (LOPRESSOR) 25 MG tablet Take 1 tablet (25 mg total) by mouth 2 (two) times daily. 60 tablet 3  . norethindrone-ethinyl estradiol (LOESTRIN 1/20, 21,) 1-20 MG-MCG tablet Take 1 tablet by mouth daily.    . pantoprazole (PROTONIX) 40 MG tablet Take 1 tablet (40 mg total) by mouth daily. 30 tablet 3   No current facility-administered medications on file prior to visit.     No Known Allergies  Family History  Problem Relation Age of Onset  . Hyperlipidemia Mother   . Diabetes Mother   . Cancer Mother 70    Uterus,Skin,Thyroid  . Heart attack Mother 54  . Irritable bowel syndrome Mother   . Heart attack Father   . Cancer Maternal Aunt 30    Breast  . Diabetes Maternal Grandfather     Social History   Social History  . Marital status: Single    Spouse name: N/A  . Number of  children: N/A  . Years of education: N/A   Occupational History  . Not on file.   Social History Main Topics  . Smoking status: Never Smoker  . Smokeless tobacco: Never Used  . Alcohol use 0.0 oz/week     Comment: occasional  . Drug use: No  . Sexual activity: Yes    Partners: Male    Birth control/ protection: Pill   Other Topics Concern  . Not on file   Social History Narrative   single, no children   [facility] [place]    had a photography business in the past   One caffeinated beverage daily   05/28/2016      Review of Systems  Constitutional: Negative for fever and weight loss.  HENT: Negative for ear discharge, ear pain, hearing loss and tinnitus.   Eyes: Negative for blurred vision, double vision, photophobia and pain.  Respiratory: Negative for cough and shortness of breath.   Cardiovascular: Negative for chest pain and palpitations.  Gastrointestinal: Positive for constipation (managed by GI presently). Negative for abdominal pain, blood in stool, diarrhea, heartburn, melena, nausea and vomiting.  Genitourinary: Negative for dysuria, flank pain, frequency, hematuria and urgency.  Musculoskeletal: Positive for joint pain. Negative for falls.  Neurological: Negative for dizziness, loss of consciousness and headaches.  Endo/Heme/Allergies: Negative for  environmental allergies.  Psychiatric/Behavioral: Negative for depression, hallucinations, substance abuse and suicidal ideas. The patient is not nervous/anxious and does not have insomnia.    BP 106/72   Pulse 67   Temp 98.5 F (36.9 C) (Oral)   Resp 14   Ht  (1.676 m)   Wt 165 lb (74.8 kg)   SpO2 99%   BMI 26.63 kg/m   Physical Exam  Constitutional: She is oriented to person, place, and time and well-developed, well-nourished, and in no distress.  HENT:  Head: Normocephalic and atraumatic.  Right Ear: Tympanic membrane, external ear and ear canal normal.  Left Ear: Tympanic membrane, external  ear and ear canal normal.  Nose: Nose normal. No mucosal edema.  Mouth/Throat: Uvula is midline, oropharynx is clear and moist and mucous membranes are normal. No oropharyngeal exudate or posterior oropharyngeal erythema.  Eyes: Conjunctivae are normal. Pupils are equal, round, and reactive to light.  Neck: Neck supple. No thyromegaly present.  Cardiovascular: Normal rate, regular rhythm, normal heart sounds and intact distal pulses.   Pulmonary/Chest: Effort normal and breath sounds normal. No respiratory distress. She has no wheezes. She has no rales.  Abdominal: Soft. Bowel sounds are normal. She exhibits no distension and no mass. There is no tenderness. There is no rebound and no guarding.  Lymphadenopathy:    She has no cervical adenopathy.  Neurological: She is alert and oriented to person, place, and time. No cranial nerve deficit.  Skin: Skin is warm and dry. No rash noted.  Psychiatric: Affect normal.  Vitals reviewed.  Recent Results (from the past 2160 hour(s))  CBC     Status: None   Collection Time: 05/01/16  9:24 AM  Result Value Ref Range   WBC 9.5 4.0 - 10.5 K/uL   RBC 4.05 3.87 - 5.11 Mil/uL   Platelets 373.0 150.0 - 400.0 K/uL   Hemoglobin 13.1 12.0 - 15.0 g/dL   HCT 16.1 09.6 - 04.5 %   MCV 94.8 78.0 - 100.0 fl   MCHC 34.1 30.0 - 36.0 g/dL   RDW 40.9 81.1 - 91.4 %  H. pylori antibody, IgG     Status: None   Collection Time: 05/01/16  9:24 AM  Result Value Ref Range   H Pylori IgG Negative Negative  Urinalysis, Routine w reflex microscopic     Status: Abnormal   Collection Time: 05/01/16  9:24 AM  Result Value Ref Range   Color, Urine YELLOW Yellow;Lt. Yellow   APPearance CLEAR Clear   Specific Gravity, Urine <=1.005 (A) 1.000 - 1.030   pH 6.5 5.0 - 8.0   Total Protein, Urine NEGATIVE Negative   Urine Glucose NEGATIVE Negative   Ketones, ur NEGATIVE Negative   Bilirubin Urine NEGATIVE Negative   Hgb urine dipstick NEGATIVE Negative   Urobilinogen, UA 0.2  0.0 - 1.0   Leukocytes, UA NEGATIVE Negative   Nitrite NEGATIVE Negative   WBC, UA none seen 0-2/hpf   RBC / HPF none seen 0-2/hpf  POC Hemoccult Bld/Stl (1-Cd Office Dx)     Status: Normal   Collection Time: 05/01/16  9:27 AM  Result Value Ref Range   Card #1 Date 3022018    Fecal Occult Blood, POC Negative Negative  POCT urine pregnancy     Status: Normal   Collection Time: 05/01/16  9:33 AM  Result Value Ref Range   Preg Test, Ur Negative Negative    Assessment/Plan: Visit for preventive health examination Depression screen negative. Health Maintenance reviewed --  up-to-date. Preventive schedule discussed and handout given in AVS. Will obtain fasting labs today.     Piedad Climes, PA-C

## 2016-06-24 NOTE — Telephone Encounter (Signed)
[employer] will ship her supply to the office.  I left a message for the patient that drug will be shipped here and that we will call her to come pick it up once it arrives.

## 2016-06-25 ENCOUNTER — Encounter: Payer: Self-pay | Admitting: Internal Medicine

## 2016-07-01 MED FILL — METOPROLOL TARTRATE 25 MG T: 25 | 30 days supply | Qty: 60 | Fill #1

## 2016-07-01 MED FILL — PANTOPRAZOLE SOD DR 40 MG T: 40 | 90 days supply | Qty: 90 | Fill #1

## 2016-07-01 MED FILL — NORETHIN-ESTRAD-FERR 1-0.02: 1-20 | 56 days supply | Qty: 56 | Fill #0

## 2016-07-02 ENCOUNTER — Telehealth: Payer: Self-pay

## 2016-07-02 DIAGNOSIS — R102 Pelvic and perineal pain: Secondary | ICD-10-CM | POA: Diagnosis not present

## 2016-07-02 DIAGNOSIS — R1032 Left lower quadrant pain: Secondary | ICD-10-CM | POA: Diagnosis not present

## 2016-07-02 DIAGNOSIS — Z118 Encounter for screening for other infectious and parasitic diseases: Secondary | ICD-10-CM | POA: Diagnosis not present

## 2016-07-02 MED FILL — metroNIDAZOLE 500 MG TABS: 500 | 7 days supply | Qty: 14 | Fill #0

## 2016-07-02 MED FILL — AZITHROMYCIN 500 MG TABLET: 500 | 1 days supply | Qty: 2 | Fill #0

## 2016-07-02 NOTE — Telephone Encounter (Signed)
Left detailed message for patient that we have her Linzess  145mcg.samples up front for pick up.  [place] [facility] [place] [place]

## 2016-07-09 DIAGNOSIS — R350 Frequency of micturition: Secondary | ICD-10-CM | POA: Diagnosis not present

## 2016-07-09 DIAGNOSIS — R102 Pelvic and perineal pain: Secondary | ICD-10-CM | POA: Diagnosis not present

## 2016-07-14 ENCOUNTER — Ambulatory Visit: Payer: 59 | Admitting: Cardiology

## 2016-08-03 ENCOUNTER — Encounter: Payer: Self-pay | Admitting: Internal Medicine

## 2016-08-03 ENCOUNTER — Ambulatory Visit (INDEPENDENT_AMBULATORY_CARE_PROVIDER_SITE_OTHER): Payer: 59 | Admitting: Internal Medicine

## 2016-08-03 VITALS — BP 104/70 | HR 77 | Ht 66.0 in | Wt 167.6 lb

## 2016-08-03 DIAGNOSIS — K601 Chronic anal fissure: Secondary | ICD-10-CM

## 2016-08-03 DIAGNOSIS — K5909 Other constipation: Secondary | ICD-10-CM | POA: Diagnosis not present

## 2016-08-03 DIAGNOSIS — Z113 Encounter for screening for infections with a predominantly sexual mode of transmission: Secondary | ICD-10-CM | POA: Diagnosis not present

## 2016-08-03 DIAGNOSIS — Z6828 Body mass index (BMI) 28.0-28.9, adult: Secondary | ICD-10-CM | POA: Diagnosis not present

## 2016-08-03 DIAGNOSIS — E282 Polycystic ovarian syndrome: Secondary | ICD-10-CM | POA: Diagnosis not present

## 2016-08-03 DIAGNOSIS — R1032 Left lower quadrant pain: Secondary | ICD-10-CM

## 2016-08-03 DIAGNOSIS — Z01419 Encounter for gynecological examination (general) (routine) without abnormal findings: Secondary | ICD-10-CM | POA: Diagnosis not present

## 2016-08-03 NOTE — Progress Notes (Signed)
   Erin KubaMary Goodwin 20 y.o. [birth year] [id number]  Assessment & Plan:   Encounter Diagnoses  Name Primary?  . Chronic constipation Yes  . Chronic posterior anal fissure   . LLQ pain     Comply w/ diltiazem  Bid at least Increase Linzess 290ug will take 2x145 as has 3 mo supply RTC 2-3 mos call back/message me sooner prn  I appreciate the opportunity to care for this patient.  QI:ONGEXBCc:Martin, Erin GarbeWilliam C, PA-Goodwin  Subjective:   Chief Complaint: rectal pain, bleeding and constipation  HPI Last seen Feb 2018 - dx anal fissure and diltiazem gel and Linzess Rx Says felt better but not resolved w/ fissure sxs Linzess 145 ug helps some but may not defecate after taking Has rectal and anal pain with and after defecation, increasing of late. Using diltiazem 1x/day  Medications, allergies, past medical history, past surgical history, family history and social history are reviewed and updated in the EMR.  Review of Systems As above  Objective:   Physical Exam BP 104/70   Pulse 77   Ht 5\' 6"  (1.676 m)   Wt 167 lb 9.6 oz (76 kg)   SpO2 99%   BMI 27.05 kg/m   Erin Spahn PA S present  NL anoderm - tender posterior mild spasm   Abd nontender  Appropriate mood/affect  15 minutes time spent with patient > half in counseling coordination of care

## 2016-08-03 NOTE — Patient Instructions (Addendum)
Try to use your Diltiazem 2 to 3 times a day.   Take 2 of your linzess 145mcg daily.  If this dose helps call us and we can send in 290 mcg rx for you.   Follow up with Dr Leone PayorGessner in 2-3 months.    I appreciate the opportunity to care for you. Stan Headarl Gessner, MD, [facility]

## 2016-08-07 ENCOUNTER — Encounter: Payer: Self-pay | Admitting: Internal Medicine

## 2016-08-24 ENCOUNTER — Ambulatory Visit: Payer: 59 | Admitting: Cardiology

## 2016-08-27 ENCOUNTER — Encounter: Payer: Self-pay | Admitting: Internal Medicine

## 2016-08-28 ENCOUNTER — Telehealth: Payer: Self-pay | Admitting: Internal Medicine

## 2016-08-28 MED ORDER — DILTIAZEM GEL 2 %: 1.0000 "application " | Freq: Two times a day (BID) | CUTANEOUS | 0 refills | Status: DC

## 2016-08-28 NOTE — Telephone Encounter (Signed)
Pharmacy calling to state that medication diltizem 2% gel has to be compounded and that pharmacy can not compound it.

## 2016-08-28 NOTE — Telephone Encounter (Signed)
I've left patient a message to call and let me know which compound pharmacy she wants this sent to.

## 2016-08-28 NOTE — Telephone Encounter (Signed)
Spoke with Natalia LeatherwoodKatherine and resent the [facility]em [facility].

## 2016-09-05 ENCOUNTER — Encounter: Payer: Self-pay | Admitting: Physician Assistant

## 2016-09-14 ENCOUNTER — Ambulatory Visit (INDEPENDENT_AMBULATORY_CARE_PROVIDER_SITE_OTHER): Payer: 59 | Admitting: Family Medicine

## 2016-09-14 DIAGNOSIS — Z23 Encounter for immunization: Secondary | ICD-10-CM | POA: Diagnosis not present

## 2016-09-14 MED ORDER — TUBERCULIN PPD 5 UNIT/0.1ML ID SOLN: 5.0000 [IU] | Freq: Once | INTRADERMAL | Status: DC

## 2016-09-14 NOTE — Progress Notes (Signed)
Patient presents today for PPD placement. Tuberculin skin test applied to Left ventral forearm. Explained how to read the test, measuring induration not just erythema; she will return to office if test appears positive./PM,CMA

## 2016-09-16 ENCOUNTER — Encounter: Payer: Self-pay | Admitting: Emergency Medicine

## 2016-09-16 ENCOUNTER — Encounter: Payer: Self-pay | Admitting: General Practice

## 2016-09-16 LAB — TB SKIN TEST
Induration: 0 mm
TB SKIN TEST: NEGATIVE

## 2016-09-17 DIAGNOSIS — Z3687 Encounter for antenatal screening for uncertain dates: Secondary | ICD-10-CM | POA: Diagnosis not present

## 2016-09-17 DIAGNOSIS — Z3201 Encounter for pregnancy test, result positive: Secondary | ICD-10-CM | POA: Diagnosis not present

## 2016-09-17 NOTE — Progress Notes (Signed)
Patient ID: Erin KubaMary Bennett, female   DOB: May 31, 1995, 21 y.o.   MRN: [id number] Pt here today for PPD.  No evidence of TB

## 2016-09-21 DIAGNOSIS — Z3687 Encounter for antenatal screening for uncertain dates: Secondary | ICD-10-CM | POA: Diagnosis not present

## 2016-09-23 ENCOUNTER — Encounter: Payer: Self-pay | Admitting: Physician Assistant

## 2016-09-23 DIAGNOSIS — Z3687 Encounter for antenatal screening for uncertain dates: Secondary | ICD-10-CM | POA: Diagnosis not present

## 2016-09-24 ENCOUNTER — Encounter: Payer: Self-pay | Admitting: Emergency Medicine

## 2016-10-02 DIAGNOSIS — Z3201 Encounter for pregnancy test, result positive: Secondary | ICD-10-CM | POA: Diagnosis not present

## 2016-10-23 ENCOUNTER — Telehealth: Payer: Self-pay | Admitting: Cardiology

## 2016-10-23 DIAGNOSIS — Z3401 Encounter for supervision of normal first pregnancy, first trimester: Secondary | ICD-10-CM | POA: Diagnosis not present

## 2016-10-23 DIAGNOSIS — Z3689 Encounter for other specified antenatal screening: Secondary | ICD-10-CM | POA: Diagnosis not present

## 2016-10-23 LAB — OB RESULTS CONSOLE RUBELLA ANTIBODY, IGM: Rubella: IMMUNE

## 2016-10-23 LAB — OB RESULTS CONSOLE ANTIBODY SCREEN: Antibody Screen: NEGATIVE

## 2016-10-23 LAB — OB RESULTS CONSOLE GC/CHLAMYDIA
CHLAMYDIA, DNA PROBE: NEGATIVE
Gonorrhea: NEGATIVE

## 2016-10-23 LAB — OB RESULTS CONSOLE HIV ANTIBODY (ROUTINE TESTING): HIV: NONREACTIVE

## 2016-10-23 LAB — OB RESULTS CONSOLE RPR: RPR: NONREACTIVE

## 2016-10-23 LAB — OB RESULTS CONSOLE ABO/RH: RH TYPE: POSITIVE

## 2016-10-23 LAB — OB RESULTS CONSOLE HEPATITIS B SURFACE ANTIGEN: Hepatitis B Surface Ag: NEGATIVE

## 2016-10-23 NOTE — Telephone Encounter (Signed)
On review of her chart, I do not see that she has an arrhythmia that I can assist her with.  I would probably not be the best provider for her.

## 2016-10-23 NOTE — Telephone Encounter (Signed)
No issues with patient wanting another provider.

## 2016-10-23 NOTE — Telephone Encounter (Signed)
New message    Sue Lush from [facility] is calling for pt. She needs to schedule an appointment for the pt. But she would like to switch providers and does not have a preference. Is this ok?

## 2016-10-24 NOTE — Telephone Encounter (Signed)
I have nothing that I can add to her care. GT

## 2016-10-29 MED FILL — DICLEGIS DR 10-10 MG TABLET: 10-10 | 30 days supply | Qty: 120 | Fill #0

## 2016-10-29 NOTE — Telephone Encounter (Signed)
See my note Prob POTS and [facility]

## 2016-10-30 DIAGNOSIS — Z36 Encounter for antenatal screening for chromosomal anomalies: Secondary | ICD-10-CM | POA: Diagnosis not present

## 2016-10-30 DIAGNOSIS — Z3682 Encounter for antenatal screening for nuchal translucency: Secondary | ICD-10-CM | POA: Diagnosis not present

## 2016-10-30 DIAGNOSIS — Z3689 Encounter for other specified antenatal screening: Secondary | ICD-10-CM | POA: Diagnosis not present

## 2016-10-30 DIAGNOSIS — Z3491 Encounter for supervision of normal pregnancy, unspecified, first trimester: Secondary | ICD-10-CM | POA: Diagnosis not present

## 2016-10-30 DIAGNOSIS — Z3401 Encounter for supervision of normal first pregnancy, first trimester: Secondary | ICD-10-CM | POA: Diagnosis not present

## 2016-10-30 NOTE — Telephone Encounter (Signed)
Melissa,   See message from Dr. Graciela HusbandsKlein.  Thanks!

## 2016-11-06 DIAGNOSIS — R000 Tachycardia, unspecified: Secondary | ICD-10-CM | POA: Diagnosis not present

## 2016-11-06 DIAGNOSIS — I951 Orthostatic hypotension: Secondary | ICD-10-CM | POA: Diagnosis not present

## 2016-11-06 DIAGNOSIS — Z3A11 11 weeks gestation of pregnancy: Secondary | ICD-10-CM | POA: Diagnosis not present

## 2016-11-06 DIAGNOSIS — Z8719 Personal history of other diseases of the digestive system: Secondary | ICD-10-CM | POA: Diagnosis not present

## 2016-11-11 DIAGNOSIS — Z3682 Encounter for antenatal screening for nuchal translucency: Secondary | ICD-10-CM | POA: Diagnosis not present

## 2016-12-11 DIAGNOSIS — Z361 Encounter for antenatal screening for raised alphafetoprotein level: Secondary | ICD-10-CM | POA: Diagnosis not present

## 2017-01-01 DIAGNOSIS — Z363 Encounter for antenatal screening for malformations: Secondary | ICD-10-CM | POA: Diagnosis not present

## 2017-01-12 DIAGNOSIS — Z362 Encounter for other antenatal screening follow-up: Secondary | ICD-10-CM | POA: Diagnosis not present

## 2017-02-05 DIAGNOSIS — R000 Tachycardia, unspecified: Secondary | ICD-10-CM | POA: Diagnosis not present

## 2017-02-05 DIAGNOSIS — Z3A24 24 weeks gestation of pregnancy: Secondary | ICD-10-CM | POA: Diagnosis not present

## 2017-02-05 DIAGNOSIS — Z8719 Personal history of other diseases of the digestive system: Secondary | ICD-10-CM | POA: Diagnosis not present

## 2017-02-05 DIAGNOSIS — I951 Orthostatic hypotension: Secondary | ICD-10-CM | POA: Diagnosis not present

## 2017-03-03 DIAGNOSIS — Z3689 Encounter for other specified antenatal screening: Secondary | ICD-10-CM | POA: Diagnosis not present

## 2017-03-17 DIAGNOSIS — Z3689 Encounter for other specified antenatal screening: Secondary | ICD-10-CM | POA: Diagnosis not present

## 2017-03-17 MED FILL — PROCTOZONE-HC 2.5 % CREA: 2.5 | 10 days supply | Qty: 30 | Fill #0

## 2017-03-18 ENCOUNTER — Ambulatory Visit: Payer: Self-pay | Admitting: Physician Assistant

## 2017-04-22 DIAGNOSIS — Z118 Encounter for screening for other infectious and parasitic diseases: Secondary | ICD-10-CM | POA: Diagnosis not present

## 2017-04-22 DIAGNOSIS — Z3685 Encounter for antenatal screening for Streptococcus B: Secondary | ICD-10-CM | POA: Diagnosis not present

## 2017-04-22 DIAGNOSIS — Z113 Encounter for screening for infections with a predominantly sexual mode of transmission: Secondary | ICD-10-CM | POA: Diagnosis not present

## 2017-04-22 LAB — OB RESULTS CONSOLE GBS: STREP GROUP B AG: NEGATIVE

## 2017-05-06 DIAGNOSIS — Z3A37 37 weeks gestation of pregnancy: Secondary | ICD-10-CM | POA: Diagnosis not present

## 2017-05-06 DIAGNOSIS — O3663X0 Maternal care for excessive fetal growth, third trimester, not applicable or unspecified: Secondary | ICD-10-CM | POA: Diagnosis not present

## 2017-05-31 ENCOUNTER — Encounter (HOSPITAL_COMMUNITY): Payer: Self-pay | Admitting: *Deleted

## 2017-05-31 ENCOUNTER — Other Ambulatory Visit: Payer: Self-pay | Admitting: Obstetrics and Gynecology

## 2017-05-31 ENCOUNTER — Inpatient Hospital Stay (HOSPITAL_COMMUNITY)
Admission: AD | Admit: 2017-05-31 | Discharge: 2017-05-31 | Disposition: A | Discharge status: Home / Self Care | Payer: 59 | Source: Ambulatory Visit | Attending: Obstetrics and Gynecology | Admitting: Obstetrics and Gynecology

## 2017-05-31 ENCOUNTER — Telehealth (HOSPITAL_COMMUNITY): Payer: Self-pay | Admitting: *Deleted

## 2017-05-31 DIAGNOSIS — D62 Acute posthemorrhagic anemia: Secondary | ICD-10-CM | POA: Diagnosis not present

## 2017-05-31 DIAGNOSIS — O9081 Anemia of the puerperium: Secondary | ICD-10-CM | POA: Diagnosis not present

## 2017-05-31 DIAGNOSIS — O99214 Obesity complicating childbirth: Secondary | ICD-10-CM | POA: Diagnosis not present

## 2017-05-31 DIAGNOSIS — E669 Obesity, unspecified: Secondary | ICD-10-CM | POA: Diagnosis not present

## 2017-05-31 DIAGNOSIS — O471 False labor at or after 37 completed weeks of gestation: Secondary | ICD-10-CM

## 2017-05-31 DIAGNOSIS — O3663X0 Maternal care for excessive fetal growth, third trimester, not applicable or unspecified: Secondary | ICD-10-CM | POA: Diagnosis not present

## 2017-05-31 DIAGNOSIS — N9989 Other postprocedural complications and disorders of genitourinary system: Secondary | ICD-10-CM | POA: Diagnosis not present

## 2017-05-31 NOTE — MAU Note (Signed)
PT  SAYS UC HURT SINCE 0200.    VE IN OFFICE - CLOSED.  DENIES HSV AND MRSA. GBS-  NEG

## 2017-05-31 NOTE — Telephone Encounter (Signed)
Preadmission screen  

## 2017-05-31 NOTE — MAU Note (Addendum)
..   I have communicated with Dr. Billy Coastaavon and reviewed vital signs:  Vitals:   05/31/17 0750 05/31/17 0752  BP: 108/73   Pulse: 85   Temp:    SpO2:  99%    Vaginal exam:  Dilation: Closed Effacement (%): 50 Cervical Position: Posterior Station: -2 Exam by:: TRACI, RN ,   Also reviewed contraction pattern and that non-stress test is reactive.  It has been documented that patient is contracting every 4-5 minutes with no cervical change over 1 hours not indicating active labor.  Patient denies any other complaints.  Based on this report provider has given order for discharge.  A discharge order and diagnosis entered by a provider.   Labor discharge instructions reviewed with patient.

## 2017-06-01 ENCOUNTER — Inpatient Hospital Stay (HOSPITAL_COMMUNITY)
Admission: AD | Admit: 2017-06-01 | Discharge: 2017-06-03 | DRG: 787 | Disposition: A | Discharge status: Home / Self Care | Payer: 59 | Source: Ambulatory Visit | Attending: Obstetrics and Gynecology | Admitting: Obstetrics and Gynecology

## 2017-06-01 ENCOUNTER — Encounter (HOSPITAL_COMMUNITY): Payer: Self-pay | Admitting: *Deleted

## 2017-06-01 ENCOUNTER — Inpatient Hospital Stay (HOSPITAL_COMMUNITY): Payer: 59 | Admitting: Anesthesiology

## 2017-06-01 ENCOUNTER — Encounter (HOSPITAL_COMMUNITY)
Admission: AD | Disposition: A | Discharge status: Home / Self Care | Payer: Self-pay | Source: Ambulatory Visit | Attending: Obstetrics and Gynecology

## 2017-06-01 DIAGNOSIS — Z3490 Encounter for supervision of normal pregnancy, unspecified, unspecified trimester: Secondary | ICD-10-CM | POA: Diagnosis present

## 2017-06-01 DIAGNOSIS — R338 Other retention of urine: Secondary | ICD-10-CM

## 2017-06-01 DIAGNOSIS — E669 Obesity, unspecified: Secondary | ICD-10-CM | POA: Diagnosis present

## 2017-06-01 DIAGNOSIS — O9081 Anemia of the puerperium: Secondary | ICD-10-CM | POA: Diagnosis not present

## 2017-06-01 DIAGNOSIS — D62 Acute posthemorrhagic anemia: Secondary | ICD-10-CM | POA: Diagnosis not present

## 2017-06-01 DIAGNOSIS — O99214 Obesity complicating childbirth: Secondary | ICD-10-CM | POA: Diagnosis present

## 2017-06-01 DIAGNOSIS — O9962 Diseases of the digestive system complicating childbirth: Secondary | ICD-10-CM | POA: Diagnosis not present

## 2017-06-01 DIAGNOSIS — Z3A40 40 weeks gestation of pregnancy: Secondary | ICD-10-CM

## 2017-06-01 DIAGNOSIS — N9989 Other postprocedural complications and disorders of genitourinary system: Secondary | ICD-10-CM | POA: Diagnosis present

## 2017-06-01 DIAGNOSIS — Z3A00 Weeks of gestation of pregnancy not specified: Secondary | ICD-10-CM | POA: Diagnosis not present

## 2017-06-01 DIAGNOSIS — O3663X0 Maternal care for excessive fetal growth, third trimester, not applicable or unspecified: Principal | ICD-10-CM | POA: Diagnosis present

## 2017-06-01 LAB — CBC
HEMATOCRIT: 32.8 % — AB (ref 36.0–46.0)
Hemoglobin: 10.7 g/dL — ABNORMAL LOW (ref 12.0–15.0)
MCH: 29.5 pg (ref 26.0–34.0)
MCHC: 32.6 g/dL (ref 30.0–36.0)
MCV: 90.4 fL (ref 78.0–100.0)
Platelets: 360 10*3/uL (ref 150–400)
RBC: 3.63 MIL/uL — ABNORMAL LOW (ref 3.87–5.11)
RDW: 13.9 % (ref 11.5–15.5)
WBC: 14.3 10*3/uL — ABNORMAL HIGH (ref 4.0–10.5)

## 2017-06-01 LAB — RPR: RPR: NONREACTIVE

## 2017-06-01 LAB — TYPE AND SCREEN
ABO/RH(D): A POS
Antibody Screen: NEGATIVE

## 2017-06-01 LAB — ABO/RH: ABO/RH(D): A POS

## 2017-06-01 SURGERY — Surgical Case
Anesthesia: Epidural

## 2017-06-01 MED ORDER — MORPHINE SULFATE (PF) 0.5 MG/ML IJ SOLN
INTRAMUSCULAR | Status: AC
  Filled 2017-06-01: qty 10

## 2017-06-01 MED ORDER — SODIUM BICARBONATE 8.4 % IV SOLN
INTRAVENOUS | Status: DC | PRN
  Administered 2017-06-01 (×3): 5 mL via EPIDURAL

## 2017-06-01 MED ORDER — SIMETHICONE 80 MG PO CHEW
80.0000 mg | CHEWABLE_TABLET | Freq: Three times a day (TID) | ORAL | Status: DC
  Administered 2017-06-01 – 2017-06-03 (×6): 80 mg via ORAL
  Filled 2017-06-01 (×5): qty 1

## 2017-06-01 MED ORDER — METHYLERGONOVINE MALEATE 0.2 MG/ML IJ SOLN: 0.2000 mg | INTRAMUSCULAR | Status: DC | PRN

## 2017-06-01 MED ORDER — BUPIVACAINE HCL (PF) 0.25 % IJ SOLN
INTRAMUSCULAR | Status: AC
  Filled 2017-06-01: qty 30

## 2017-06-01 MED ORDER — DIPHENHYDRAMINE HCL 25 MG PO CAPS: 25.0000 mg | ORAL_CAPSULE | ORAL | Status: DC | PRN

## 2017-06-01 MED ORDER — MISOPROSTOL 25 MCG QUARTER TABLET
25.0000 ug | ORAL_TABLET | ORAL | Status: DC | PRN
  Filled 2017-06-01: qty 1

## 2017-06-01 MED ORDER — OXYCODONE-ACETAMINOPHEN 5-325 MG PO TABS: 2.0000 | ORAL_TABLET | ORAL | Status: DC | PRN

## 2017-06-01 MED ORDER — LACTATED RINGERS IV SOLN: 500.0000 mL | INTRAVENOUS | Status: DC | PRN

## 2017-06-01 MED ORDER — SIMETHICONE 80 MG PO CHEW
80.0000 mg | CHEWABLE_TABLET | ORAL | Status: DC
  Administered 2017-06-02: 80 mg via ORAL
  Filled 2017-06-01 (×2): qty 1

## 2017-06-01 MED ORDER — EPHEDRINE 5 MG/ML INJ
10.0000 mg | INTRAVENOUS | Status: DC | PRN
Start: 2017-06-01 — End: 2017-06-01

## 2017-06-01 MED ORDER — MENTHOL 3 MG MT LOZG: 1.0000 | LOZENGE | OROMUCOSAL | Status: DC | PRN

## 2017-06-01 MED ORDER — ONDANSETRON HCL 4 MG/2ML IJ SOLN
INTRAMUSCULAR | Status: DC | PRN
  Administered 2017-06-01: 4 mg via INTRAVENOUS

## 2017-06-01 MED ORDER — ACETAMINOPHEN 325 MG PO TABS: 650.0000 mg | ORAL_TABLET | ORAL | Status: DC | PRN

## 2017-06-01 MED ORDER — SODIUM BICARBONATE 8.4 % IV SOLN
INTRAVENOUS | Status: AC
  Filled 2017-06-01: qty 50

## 2017-06-01 MED ORDER — DIPHENHYDRAMINE HCL 25 MG PO CAPS: 25.0000 mg | ORAL_CAPSULE | Freq: Four times a day (QID) | ORAL | Status: DC | PRN

## 2017-06-01 MED ORDER — SOD CITRATE-CITRIC ACID 500-334 MG/5ML PO SOLN
30.0000 mL | ORAL | Status: DC | PRN
  Filled 2017-06-01: qty 30

## 2017-06-01 MED ORDER — ONDANSETRON HCL 4 MG/2ML IJ SOLN: 4.0000 mg | Freq: Four times a day (QID) | INTRAMUSCULAR | Status: DC | PRN

## 2017-06-01 MED ORDER — SODIUM CHLORIDE 0.9% FLUSH: 3.0000 mL | INTRAVENOUS | Status: DC | PRN

## 2017-06-01 MED ORDER — ONDANSETRON HCL 4 MG/2ML IJ SOLN: 4.0000 mg | Freq: Three times a day (TID) | INTRAMUSCULAR | Status: DC | PRN

## 2017-06-01 MED ORDER — NALBUPHINE HCL 10 MG/ML IJ SOLN: 5.0000 mg | Freq: Once | INTRAMUSCULAR | Status: DC | PRN

## 2017-06-01 MED ORDER — CEFAZOLIN SODIUM-DEXTROSE 2-3 GM-%(50ML) IV SOLR
INTRAVENOUS | Status: DC | PRN
  Administered 2017-06-01: 2 g via INTRAVENOUS

## 2017-06-01 MED ORDER — PHENYLEPHRINE 40 MCG/ML (10ML) SYRINGE FOR IV PUSH (FOR BLOOD PRESSURE SUPPORT)
80.0000 ug | PREFILLED_SYRINGE | INTRAVENOUS | Status: DC | PRN
  Filled 2017-06-01: qty 10

## 2017-06-01 MED ORDER — SCOPOLAMINE 1 MG/3DAYS TD PT72
MEDICATED_PATCH | TRANSDERMAL | Status: DC | PRN
  Administered 2017-06-01: 1 via TRANSDERMAL

## 2017-06-01 MED ORDER — DEXAMETHASONE SODIUM PHOSPHATE 10 MG/ML IJ SOLN
INTRAMUSCULAR | Status: DC | PRN
  Administered 2017-06-01: 10 mg via INTRAVENOUS

## 2017-06-01 MED ORDER — NALBUPHINE HCL 10 MG/ML IJ SOLN: 5.0000 mg | INTRAMUSCULAR | Status: DC | PRN

## 2017-06-01 MED ORDER — LACTATED RINGERS IV SOLN
INTRAVENOUS | Status: DC | PRN
  Administered 2017-06-01: 11:00:00 via INTRAVENOUS

## 2017-06-01 MED ORDER — IBUPROFEN 600 MG PO TABS
600.0000 mg | ORAL_TABLET | Freq: Four times a day (QID) | ORAL | Status: DC
  Administered 2017-06-01 – 2017-06-03 (×7): 600 mg via ORAL
  Filled 2017-06-01 (×7): qty 1

## 2017-06-01 MED ORDER — FLEET ENEMA 7-19 GM/118ML RE ENEM: 1.0000 | ENEMA | RECTAL | Status: DC | PRN

## 2017-06-01 MED ORDER — METHYLERGONOVINE MALEATE 0.2 MG PO TABS: 0.2000 mg | ORAL_TABLET | ORAL | Status: DC | PRN

## 2017-06-01 MED ORDER — OXYTOCIN 40 UNITS IN LACTATED RINGERS INFUSION - SIMPLE MED: 2.5000 [IU]/h | INTRAVENOUS | Status: DC

## 2017-06-01 MED ORDER — LACTATED RINGERS IV SOLN
INTRAVENOUS | Status: DC
  Administered 2017-06-01 (×2): via INTRAVENOUS

## 2017-06-01 MED ORDER — TERBUTALINE SULFATE 1 MG/ML IJ SOLN: 0.2500 mg | Freq: Once | INTRAMUSCULAR | Status: DC | PRN

## 2017-06-01 MED ORDER — OXYTOCIN 10 UNIT/ML IJ SOLN
INTRAMUSCULAR | Status: AC
  Filled 2017-06-01: qty 4

## 2017-06-01 MED ORDER — CEFAZOLIN SODIUM-DEXTROSE 2-4 GM/100ML-% IV SOLN
INTRAVENOUS | Status: AC
  Filled 2017-06-01: qty 100

## 2017-06-01 MED ORDER — LACTATED RINGERS IV SOLN: INTRAVENOUS | Status: DC

## 2017-06-01 MED ORDER — OXYCODONE-ACETAMINOPHEN 5-325 MG PO TABS
1.0000 | ORAL_TABLET | ORAL | Status: DC | PRN
  Administered 2017-06-02 – 2017-06-03 (×3): 1 via ORAL
  Filled 2017-06-01 (×3): qty 1

## 2017-06-01 MED ORDER — OXYTOCIN 40 UNITS IN LACTATED RINGERS INFUSION - SIMPLE MED
1.0000 m[IU]/min | INTRAVENOUS | Status: DC
  Administered 2017-06-01: 2 m[IU]/min via INTRAVENOUS
  Filled 2017-06-01: qty 1000

## 2017-06-01 MED ORDER — TERBUTALINE SULFATE 1 MG/ML IJ SOLN
0.2500 mg | Freq: Once | INTRAMUSCULAR | Status: DC | PRN
  Filled 2017-06-01: qty 1

## 2017-06-01 MED ORDER — DEXAMETHASONE SODIUM PHOSPHATE 10 MG/ML IJ SOLN
INTRAMUSCULAR | Status: AC
  Filled 2017-06-01: qty 1

## 2017-06-01 MED ORDER — COCONUT OIL OIL
1.0000 "application " | TOPICAL_OIL | Status: DC | PRN
  Administered 2017-06-02: 1 via TOPICAL
  Filled 2017-06-01: qty 120

## 2017-06-01 MED ORDER — LIDOCAINE HCL (PF) 1 % IJ SOLN
INTRAMUSCULAR | Status: DC | PRN
  Administered 2017-06-01: 6 mL
  Administered 2017-06-01: 4 mL

## 2017-06-01 MED ORDER — OXYTOCIN 10 UNIT/ML IJ SOLN
INTRAVENOUS | Status: DC | PRN
  Administered 2017-06-01: 40 [IU] via INTRAVENOUS

## 2017-06-01 MED ORDER — ONDANSETRON HCL 4 MG/2ML IJ SOLN
INTRAMUSCULAR | Status: AC
  Filled 2017-06-01: qty 2

## 2017-06-01 MED ORDER — SENNOSIDES-DOCUSATE SODIUM 8.6-50 MG PO TABS
2.0000 | ORAL_TABLET | ORAL | Status: DC
  Administered 2017-06-02 (×2): 2 via ORAL
  Filled 2017-06-01 (×2): qty 2

## 2017-06-01 MED ORDER — FENTANYL CITRATE (PF) 100 MCG/2ML IJ SOLN: 25.0000 ug | INTRAMUSCULAR | Status: DC | PRN

## 2017-06-01 MED ORDER — WITCH HAZEL-GLYCERIN EX PADS: 1.0000 "application " | MEDICATED_PAD | CUTANEOUS | Status: DC | PRN

## 2017-06-01 MED ORDER — LACTATED RINGERS IV SOLN
500.0000 mL | Freq: Once | INTRAVENOUS | Status: AC
  Administered 2017-06-01: 500 mL via INTRAVENOUS

## 2017-06-01 MED ORDER — KETOROLAC TROMETHAMINE 30 MG/ML IJ SOLN
30.0000 mg | Freq: Four times a day (QID) | INTRAMUSCULAR | Status: DC | PRN
  Administered 2017-06-01: 30 mg via INTRAMUSCULAR

## 2017-06-01 MED ORDER — SOD CITRATE-CITRIC ACID 500-334 MG/5ML PO SOLN
30.0000 mL | ORAL | Status: DC | PRN
  Administered 2017-06-01: 30 mL via ORAL
  Filled 2017-06-01: qty 15

## 2017-06-01 MED ORDER — SODIUM CHLORIDE 0.9 % IR SOLN
Status: DC | PRN
  Administered 2017-06-01: 1

## 2017-06-01 MED ORDER — MORPHINE SULFATE (PF) 0.5 MG/ML IJ SOLN
INTRAMUSCULAR | Status: DC | PRN
  Administered 2017-06-01: 3 mg via EPIDURAL

## 2017-06-01 MED ORDER — NALOXONE HCL 0.4 MG/ML IJ SOLN: 0.4000 mg | INTRAMUSCULAR | Status: DC | PRN

## 2017-06-01 MED ORDER — TETANUS-DIPHTH-ACELL PERTUSSIS 5-2.5-18.5 LF-MCG/0.5 IM SUSP: 0.5000 mL | Freq: Once | INTRAMUSCULAR | Status: DC

## 2017-06-01 MED ORDER — EPHEDRINE 5 MG/ML INJ: 10.0000 mg | INTRAVENOUS | Status: DC | PRN

## 2017-06-01 MED ORDER — NALOXONE HCL 4 MG/10ML IJ SOLN
1.0000 ug/kg/h | INTRAVENOUS | Status: DC | PRN
  Filled 2017-06-01: qty 5

## 2017-06-01 MED ORDER — SCOPOLAMINE 1 MG/3DAYS TD PT72
MEDICATED_PATCH | TRANSDERMAL | Status: AC
  Filled 2017-06-01: qty 1

## 2017-06-01 MED ORDER — SIMETHICONE 80 MG PO CHEW: 80.0000 mg | CHEWABLE_TABLET | ORAL | Status: DC | PRN

## 2017-06-01 MED ORDER — LACTATED RINGERS IV SOLN
INTRAVENOUS | Status: DC
  Administered 2017-06-01: 125 mL/h via INTRAVENOUS
  Administered 2017-06-02: 02:00:00 via INTRAVENOUS

## 2017-06-01 MED ORDER — OXYCODONE-ACETAMINOPHEN 5-325 MG PO TABS: 1.0000 | ORAL_TABLET | ORAL | Status: DC | PRN

## 2017-06-01 MED ORDER — DIPHENHYDRAMINE HCL 50 MG/ML IJ SOLN: 12.5000 mg | INTRAMUSCULAR | Status: DC | PRN

## 2017-06-01 MED ORDER — OXYTOCIN BOLUS FROM INFUSION: 500.0000 mL | Freq: Once | INTRAVENOUS | Status: DC

## 2017-06-01 MED ORDER — PHENYLEPHRINE 40 MCG/ML (10ML) SYRINGE FOR IV PUSH (FOR BLOOD PRESSURE SUPPORT): 80.0000 ug | PREFILLED_SYRINGE | INTRAVENOUS | Status: DC | PRN

## 2017-06-01 MED ORDER — FENTANYL 2.5 MCG/ML BUPIVACAINE 1/10 % EPIDURAL INFUSION (WH - ANES)
14.0000 mL/h | INTRAMUSCULAR | Status: DC | PRN
  Administered 2017-06-01 (×2): 14 mL/h via EPIDURAL
  Filled 2017-06-01 (×2): qty 100

## 2017-06-01 MED ORDER — PRENATAL MULTIVITAMIN CH
1.0000 | ORAL_TABLET | Freq: Every day | ORAL | Status: DC
  Administered 2017-06-02: 1 via ORAL
  Filled 2017-06-01: qty 1

## 2017-06-01 MED ORDER — BUPIVACAINE HCL (PF) 0.25 % IJ SOLN
INTRAMUSCULAR | Status: DC | PRN
  Administered 2017-06-01: 20 mL

## 2017-06-01 MED ORDER — DIBUCAINE 1 % RE OINT: 1.0000 "application " | TOPICAL_OINTMENT | RECTAL | Status: DC | PRN

## 2017-06-01 MED ORDER — KETOROLAC TROMETHAMINE 30 MG/ML IJ SOLN: 30.0000 mg | Freq: Four times a day (QID) | INTRAMUSCULAR | Status: DC | PRN

## 2017-06-01 MED ORDER — KETOROLAC TROMETHAMINE 30 MG/ML IJ SOLN
INTRAMUSCULAR | Status: AC
  Filled 2017-06-01: qty 1

## 2017-06-01 MED ORDER — OXYTOCIN 40 UNITS IN LACTATED RINGERS INFUSION - SIMPLE MED: 1.0000 m[IU]/min | INTRAVENOUS | Status: DC

## 2017-06-01 MED ORDER — ZOLPIDEM TARTRATE 5 MG PO TABS: 5.0000 mg | ORAL_TABLET | Freq: Every evening | ORAL | Status: DC | PRN

## 2017-06-01 MED ORDER — LIDOCAINE HCL (PF) 1 % IJ SOLN: 30.0000 mL | INTRAMUSCULAR | Status: DC | PRN

## 2017-06-01 MED ORDER — MEPERIDINE HCL 25 MG/ML IJ SOLN: 6.2500 mg | INTRAMUSCULAR | Status: DC | PRN

## 2017-06-01 SURGICAL SUPPLY — 33 items
CHLORAPREP W/TINT 26ML (MISCELLANEOUS) ×2 IMPLANT
CLAMP CORD UMBIL (MISCELLANEOUS) IMPLANT
CLOTH BEACON ORANGE TIMEOUT ST (SAFETY) ×2 IMPLANT
DERMABOND ADVANCED (GAUZE/BANDAGES/DRESSINGS) ×1
DERMABOND ADVANCED .7 DNX12 (GAUZE/BANDAGES/DRESSINGS) ×1 IMPLANT
DRSG OPSITE POSTOP 4X10 (GAUZE/BANDAGES/DRESSINGS) ×2 IMPLANT
ELECT REM PT RETURN 9FT ADLT (ELECTROSURGICAL) ×2
ELECTRODE REM PT RTRN 9FT ADLT (ELECTROSURGICAL) ×1 IMPLANT
EXTRACTOR VACUUM M CUP 4 TUBE (SUCTIONS) IMPLANT
GLOVE BIO SURGEON STRL SZ7.5 (GLOVE) ×2 IMPLANT
GLOVE BIOGEL PI IND STRL 7.0 (GLOVE) ×1 IMPLANT
GLOVE BIOGEL PI INDICATOR 7.0 (GLOVE) ×1
GOWN STRL REUS W/TWL LRG LVL3 (GOWN DISPOSABLE) ×4 IMPLANT
KIT ABG SYR 3ML LUER SLIP (SYRINGE) IMPLANT
NEEDLE HYPO 22GX1.5 SAFETY (NEEDLE) ×2 IMPLANT
NEEDLE HYPO 25X5/8 SAFETYGLIDE (NEEDLE) IMPLANT
NEEDLE SPNL 20GX3.5 QUINCKE YW (NEEDLE) IMPLANT
NS IRRIG 1000ML POUR BTL (IV SOLUTION) ×2 IMPLANT
PACK C SECTION WH (CUSTOM PROCEDURE TRAY) ×2 IMPLANT
PENCIL SMOKE EVAC W/HOLSTER (ELECTROSURGICAL) ×2 IMPLANT
SUT MNCRL 0 VIOLET CTX 36 (SUTURE) ×2 IMPLANT
SUT MNCRL AB 3-0 PS2 27 (SUTURE) ×2 IMPLANT
SUT MON AB 2-0 CT1 27 (SUTURE) ×2 IMPLANT
SUT MON AB-0 CT1 36 (SUTURE) ×4 IMPLANT
SUT MONOCRYL 0 CTX 36 (SUTURE) ×2
SUT PLAIN 0 NONE (SUTURE) IMPLANT
SUT PLAIN 2 0 (SUTURE) ×1
SUT PLAIN 2 0 XLH (SUTURE) IMPLANT
SUT PLAIN ABS 2-0 CT1 27XMFL (SUTURE) ×1 IMPLANT
SYR 20CC LL (SYRINGE) IMPLANT
SYR CONTROL 10ML LL (SYRINGE) ×2 IMPLANT
TOWEL OR 17X24 6PK STRL BLUE (TOWEL DISPOSABLE) ×2 IMPLANT
TRAY FOLEY BAG SILVER LF 14FR (SET/KITS/TRAYS/PACK) ×2 IMPLANT

## 2017-06-01 NOTE — Anesthesia Postprocedure Evaluation (Signed)
Anesthesia Post Note  Patient: Erin SpurlingMary Goodwin  Procedure(s) Performed: CESAREAN SECTION (N/A )     Patient location during evaluation: PACU Anesthesia Type: Epidural Level of consciousness: awake and alert and oriented Pain management: pain level controlled Vital Signs Assessment: post-procedure vital signs reviewed and stable Respiratory status: nonlabored ventilation, spontaneous breathing and respiratory function stable Cardiovascular status: blood pressure returned to baseline and stable Postop Assessment: no backache, no headache, epidural receding, patient able to bend at knees and no apparent nausea or vomiting Anesthetic complications: no    Last Vitals:  Vitals:   06/01/17 1230 06/01/17 1241  BP: 108/73 112/63  Pulse: 83 74  Resp: 15 18  Temp: 36.7 C 36.7 C  SpO2: 100% 100%    Last Pain:  Vitals:   06/01/17 1241  TempSrc: Oral  PainSc:    Pain Goal: Patients Stated Pain Goal: 10 (06/01/17 0236)               Leda Bellefeuille A.

## 2017-06-01 NOTE — H&P (Signed)
Erin Goodwin is a [age] y.o. female presenting for labor. OB History    Gravida  1   Para  0   Term      Preterm      AB      Living        SAB      TAB      Ectopic      Multiple      Live Births             Past Medical History:  Diagnosis Date  . Chronic constipation   . Chronic posterior anal fissure   . External hemorrhoids   . Family history of early CAD   . Family history of headaches   . GERD (gastroesophageal reflux disease)   . Hirsutism   . History of chicken pox   . Hypotension   . Palpitations   . PCOS (polycystic ovarian syndrome)    Past Surgical History:  Procedure Laterality Date  . TONSILLECTOMY AND ADENOIDECTOMY     Family History: family history includes COPD in her mother; Cancer (age of onset: 3830) in her maternal aunt and mother; Diabetes in her maternal grandfather and mother; Heart attack in her father; Heart attack (age of onset: 6340) in her mother; Hyperlipidemia in her mother; Irritable bowel syndrome in her mother. Social History:  reports that she has never smoked. She has never used smokeless tobacco. She reports that she drinks alcohol. She reports that she does not use drugs.     Maternal Diabetes: No Genetic Screening: Normal Maternal Ultrasounds/Referrals: Normal Fetal Ultrasounds or other Referrals:  None Maternal Substance Abuse:  No Significant Maternal Medications:  None Significant Maternal Lab Results:  None Other Comments:  None  Review of Systems  Constitutional: Negative.   All other systems reviewed and are negative.  Maternal Medical History:  Reason for admission: Contractions.   Contractions: Onset was 3-5 hours ago.   Frequency: regular.    Fetal activity: Perceived fetal activity is normal.   Last perceived fetal movement was within the past hour.    Prenatal complications: no prenatal complications Prenatal Complications - Diabetes: none.    Dilation: 4.5 Effacement (%): 70 Station: -3,  -2 Exam by:: Delta Air LinesMeredith Sigmon, CNM Blood pressure 106/69, pulse 92, temperature 97.8 F (36.6 C), temperature source Axillary, resp. rate 16, height 5\' 6"  (1.676 m), weight 87.5 kg (193 lb), SpO2 100 %. Maternal Exam:  Uterine Assessment: Contraction strength is moderate.  Contraction frequency is regular.   Abdomen: Patient reports no abdominal tenderness. Fetal presentation: vertex  Introitus: Normal vulva. Normal vagina.  Ferning test: not done.  Nitrazine test: not done. Amniotic fluid character: not assessed.  Pelvis: of concern for delivery.   Cervix: Cervix evaluated by digital exam.     Physical Exam  Nursing note and vitals reviewed. Constitutional: She is oriented to person, place, and time. She appears well-developed and well-nourished.  HENT:  Head: Normocephalic and atraumatic.  Neck: Normal range of motion. Neck supple.  Cardiovascular: Normal rate and regular rhythm.  Respiratory: Effort normal and breath sounds normal.  GI: Soft. Bowel sounds are normal.  Genitourinary: Vagina normal and uterus normal.  Musculoskeletal: Normal range of motion.  Neurological: She is alert and oriented to person, place, and time. She has normal reflexes.  Skin: Skin is warm and dry.  Psychiatric: She has a normal mood and affect.    Prenatal labs: ABO, Rh: --/--/A POS, A POS Performed at [facility], [address], [place], [place] [address]  [phone]/02 0200) Antibody: NEG (04/02 0200) Rubella: Immune (08/24 0000) RPR: Nonreactive (08/24 0000)  HBsAg: Negative (08/24 0000)  HIV: Non-reactive (08/24 0000)  GBS: Negative (02/21 0000)   Assessment/Plan: Term IUP LGA Borderline pelvimetry Active Labor Admit   Myldred Raju J 06/01/2017, 6:09 AM

## 2017-06-01 NOTE — Progress Notes (Signed)
Erin SpurlingMary Goodwin is a 22 y.o. G1P0 at 2688w5d by LMP admitted for active labor, rupture of membranes  Subjective: Feeling contractions  Objective: BP 113/66   Pulse 90   Temp 97.8 F (36.6 C) (Oral)   Resp 16   Ht 5\' 6"  (1.676 m)   Wt 87.5 kg (193 lb)   SpO2 100%   BMI 31.15 kg/m  I/O last 3 completed shifts: In: -  Out: 1000 [Urine:1000] Total I/O In: -  Out: 1350 [Urine:1350]  FHT:  FHR: 155 bpm, variability: moderate,  accelerations:  Present,  decelerations:  Absent UC:   regular, every 2-4 minutes SVE:   6/90/-2 No change Poorly applied vtx Android pelvis IUPC-150+  Labs: Lab Results  Component Value Date   WBC 14.3 (H) 06/01/2017   HGB 10.7 (L) 06/01/2017   HCT 32.8 (L) 06/01/2017   MCV 90.4 06/01/2017   PLT 360 06/01/2017    Assessment / Plan: Arrest in active phase of labor- no change x 3 hrs Suspicion for true CPD with android pelvis LGA by sono  Labor: no progress with augmentation Preeclampsia:  no signs or symptoms of toxicity Fetal Wellbeing:  Category I Pain Control:  Epidural I/D:  n/a Anticipated MOD:  Proceed with csection. Risks vs benefits of procedure discussed. Consent done  Faheem Ziemann J 06/01/2017, 10:29 AM

## 2017-06-01 NOTE — Anesthesia Preprocedure Evaluation (Addendum)
Anesthesia Evaluation  Patient identified by MRN, date of birth, ID band Patient awake    Reviewed: Allergy & Precautions, H&P , Patient's Chart, lab work & pertinent test results  Airway Mallampati: II  TM Distance: >3 FB Neck ROM: full    Dental no notable dental hx. (+) Teeth Intact   Pulmonary    Pulmonary exam normal breath sounds clear to auscultation       Cardiovascular negative cardio ROS Normal cardiovascular exam Rhythm:regular Rate:Normal     Neuro/Psych negative neurological ROS     GI/Hepatic Neg liver ROS, GERD  Medicated and Controlled,  Endo/Other  Obesity  Renal/GU negative Renal ROS  negative genitourinary   Musculoskeletal negative musculoskeletal ROS (+)   Abdominal (+) + obese,   Peds  Hematology negative hematology ROS (+)   Anesthesia Other Findings       Reproductive/Obstetrics (+) Pregnancy                            Anesthesia Physical Anesthesia Plan  ASA: II and emergent  Anesthesia Plan: Epidural   Post-op Pain Management:    Induction:   PONV Risk Score and Plan:   Airway Management Planned:   Additional Equipment:   Intra-op Plan:   Post-operative Plan:   Informed Consent: I have reviewed the patients History and Physical, chart, labs and discussed the procedure including the risks, benefits and alternatives for the proposed anesthesia with the patient or authorized representative who has indicated his/her understanding and acceptance.   Dental Advisory Given  Plan Discussed with: Anesthesiologist, CRNA and Surgeon  Anesthesia Plan Comments: (Labs checked- platelets confirmed with RN in room. Fetal heart tracing, per RN, reported to be stable enough for sitting procedure. Discussed epidural, and patient consents to the procedure:  included risk of possible headache,backache, failed block, allergic reaction, and nerve injury. This  patient was asked if she had any questions or concerns before the procedure started.  Patient for C/Section for arrest of descent. Will attempt to use epidural for C/Section. M. Malen GauzeFoster, MD)      Anesthesia Quick Evaluation

## 2017-06-01 NOTE — MAU Note (Addendum)
PT  SAYS WAS HERE THIS AM -  SENT  HOME - HAS HAD UC'S ALL DAY.  THINKS  SROM AT 8PM.    IS A SCH  INDUCTION   AT [place]

## 2017-06-01 NOTE — Plan of Care (Signed)
[facility]  RN reviewed paperwork as well as educated patient on topics including plan of care, fall risk safety plan, baby safety information, etc.  Call light within reach and white room phone within reach.  Will continue to monitor.

## 2017-06-01 NOTE — Progress Notes (Signed)
Decision made to proceed with c/section for delivery of infant.

## 2017-06-01 NOTE — Progress Notes (Signed)
S:  Pt. Comfortable with epidural - discussed Pitocin, AROM and IUPC placement with patient and she agrees with plan   O:  VS: Blood pressure 107/66, pulse 78, temperature 97.8 F (36.6 C), temperature source Axillary, resp. rate 18, height 5\' 6"  (1.676 m), weight 87.5 kg (193 lb), SpO2 100 %.        FHR : baseline 125 bpm / variability moderate / accelerations +15x15 / no decelerations / + scalp stimulation         Toco: contractions are irregular / moderate        Cervix : Dilation: 4.5 Effacement (%): 70 Cervical Position: Middle Station: -3, -2 Presentation: Vertex Exam by:: Carlean JewsMeredith Rai Sinagra, CNM        Membranes: AROM for small amount of clear fluid; bloody show present  A: Latent labor     FHR category 1    GBS Negative  P: IUPC placed     Begin Pitocin at 2 milliunits and increase by 2 milliunits     Dr. Billy Coastaavon to resume management     Carlean JewsMeredith Tiona Ruane, MSN, CNM [facility]

## 2017-06-01 NOTE — Op Note (Signed)
Cesarean Section Procedure Note  Indications: cephalo-pelvic disproportion and failure to progress: arrest of dilation  Pre-operative Diagnosis: 40 week 5 day pregnancy.  Post-operative Diagnosis: same  Surgeon: Lenoard AdenAAVON,Signa Cheek J   Assistants: Idelle JoSigmon, CNM  Anesthesia: Epidural anesthesia and Local anesthesia 0.25.% bupivacaine  ASA Class: 2  Procedure Details  The patient was seen in the Holding Room. The risks, benefits, complications, treatment options, and expected outcomes were discussed with the patient.  The patient concurred with the proposed plan, giving informed consent. The risks of anesthesia, infection, bleeding and possible injury to other organs discussed. Injury to bowel, bladder, or ureter with possible need for repair discussed. Possible need for transfusion with secondary risks of hepatitis or HIV acquisition discussed. Post operative complications to include but not limited to DVT, PE and Pneumonia noted. The site of surgery properly noted/marked. The patient was taken to Operating Room # 1, identified as Georgena SpurlingMary Doughman and the procedure verified as C-Section Delivery. A Time Out was held and the above information confirmed.  After induction of anesthesia, the patient was draped and prepped in the usual sterile manner. A Pfannenstiel incision was made and carried down through the subcutaneous tissue to the fascia. Fascial incision was made and extended transversely using Mayo scissors. The fascia was separated from the underlying rectus tissue superiorly and inferiorly. The peritoneum was identified and entered. Peritoneal incision was extended longitudinally. The utero-vesical peritoneal reflection was incised transversely and the bladder flap was bluntly freed from the lower uterine segment. A low transverse uterine incision(Kerr hysterotomy) was made. Delivered from OA presentation was a  female with Apgar scores of 8 at one minute and 9 at five minutes. Bulb suctioning gently  performed. Neonatal team in attendance.After the umbilical cord was clamped and cut cord blood was obtained for evaluation. The placenta was removed intact and appeared normal. The uterus was curetted with a dry lap pack. Good hemostasis was noted.The uterine outline, tubes and ovaries appeared normal. The uterine incision was closed with running locked sutures of 0 Monocryl x 2 layers. Hemostasis was observed. The parietal peritoneum was closed with a running 2-0 Monocryl suture. The fascia was then reapproximated with running sutures of 0 Monocryl. The skin was reapproximated with 3-0 monocryl after [place] closure with 2-0 plain.  Instrument, sponge, and needle counts were correct prior the abdominal closure and at the conclusion of the case.   Findings: FTLM, anterior placenta, nl adnexa  Estimated Blood Loss:  500         Drains: foley                 Specimens: placenta                 Complications:  None; patient tolerated the procedure well.         Disposition: PACU - hemodynamically stable.         Condition: stable  Attending Attestation: I performed the procedure.

## 2017-06-01 NOTE — Transfer of Care (Addendum)
Immediate Anesthesia Transfer of Care Note  Patient: Erin Goodwin  Procedure(s) Performed: CESAREAN SECTION (N/A )  Patient Location: PACU  Anesthesia Type:Spinal  Level of Consciousness: awake and alert   Airway & Oxygen Therapy: Patient Spontanous Breathing and Patient connected to nasal cannula oxygen  Post-op Assessment: Report given to RN and Post -op Vital signs reviewed and stable  Post vital signs: Reviewed and stable  Last Vitals:  Vitals Value Taken Time  BP    Temp    Pulse    Resp    SpO2      Last Pain:  Vitals:   06/01/17 0937  TempSrc: Oral  PainSc:       Patients Stated Pain Goal: 10 (06/01/17 0236)  Complications: No apparent anesthesia complications

## 2017-06-01 NOTE — Anesthesia Procedure Notes (Signed)
Epidural Patient location during procedure: OB  Staffing Anesthesiologist: Palmyra Rogacki, MD  Preanesthetic Checklist Completed: patient identified, pre-op evaluation, timeout performed, IV checked, risks and benefits discussed and monitors and equipment checked  Epidural Patient position: sitting Prep: DuraPrep Patient monitoring: blood pressure and continuous pulse ox Approach: right paramedian Location: L3-L4 Injection technique: LOR air  Needle:  Needle type: Tuohy  Needle gauge: 17 G Needle insertion depth: 5 cm Catheter type: closed end flexible Catheter size: 19 Gauge Catheter at skin depth: 10 cm Test dose: negative  Assessment Sensory level: T8  Additional Notes   Dosing of Epidural:  1st dose, through catheter .............................................  Xylocaine 40 mg  2nd dose, through catheter, after waiting 3 minutes.........Xylocaine 60 mg    As each dose occurred, patient was free of IV sx; and patient exhibited no evidence of SA injection.  Patient is more comfortable after epidural dosed. Please see RN's note for documentation of vital signs,and FHR which are stable.  Patient reminded not to try to ambulate with numb legs, and that an RN must be present when she attempts to get up.          

## 2017-06-02 ENCOUNTER — Encounter (HOSPITAL_COMMUNITY): Payer: Self-pay | Admitting: Obstetrics and Gynecology

## 2017-06-02 ENCOUNTER — Inpatient Hospital Stay (HOSPITAL_COMMUNITY)
Admit: 2017-06-02 | Discharge: 2017-06-02 | Disposition: A | Discharge status: Home / Self Care | Payer: 59 | Attending: Obstetrics and Gynecology | Admitting: Obstetrics and Gynecology

## 2017-06-02 DIAGNOSIS — R338 Other retention of urine: Secondary | ICD-10-CM

## 2017-06-02 DIAGNOSIS — N9989 Other postprocedural complications and disorders of genitourinary system: Secondary | ICD-10-CM | POA: Diagnosis not present

## 2017-06-02 LAB — CBC
HCT: 27.9 % — ABNORMAL LOW (ref 36.0–46.0)
Hemoglobin: 9.2 g/dL — ABNORMAL LOW (ref 12.0–15.0)
MCH: 30 pg (ref 26.0–34.0)
MCHC: 33 g/dL (ref 30.0–36.0)
MCV: 90.9 fL (ref 78.0–100.0)
PLATELETS: 318 10*3/uL (ref 150–400)
RBC: 3.07 MIL/uL — ABNORMAL LOW (ref 3.87–5.11)
RDW: 14.1 % (ref 11.5–15.5)
WBC: 17.6 10*3/uL — ABNORMAL HIGH (ref 4.0–10.5)

## 2017-06-02 LAB — BIRTH TISSUE RECOVERY COLLECTION (PLACENTA DONATION)

## 2017-06-02 MED ORDER — MAGNESIUM OXIDE 400 (241.3 MG) MG PO TABS
400.0000 mg | ORAL_TABLET | Freq: Every day | ORAL | Status: DC
  Administered 2017-06-02 – 2017-06-03 (×2): 400 mg via ORAL
  Filled 2017-06-02 (×2): qty 1

## 2017-06-02 MED ORDER — POLYSACCHARIDE IRON COMPLEX 150 MG PO CAPS
150.0000 mg | ORAL_CAPSULE | Freq: Every day | ORAL | Status: DC
  Administered 2017-06-02 – 2017-06-03 (×2): 150 mg via ORAL
  Filled 2017-06-02 (×2): qty 1

## 2017-06-02 NOTE — Progress Notes (Signed)
   06/02/17 0735  Genitourinary  Genitourinary (WDL) X  Genitourinary Symptoms Other (Comment) (pt request I &O cath. 1400 out)  Urine Assessment  Intermittent/Straight Cath (mL) 1400 mL  pt attempted to urinate; unable to empty.

## 2017-06-02 NOTE — Lactation Note (Signed)
This note was copied from a baby's chart. Lactation Consultation Note  Patient Name: Erin Goodwin   Initial visit at 27 hours of life. Mom is a P1 who reports small amount of breast changes with this pregnancy.  Infant is currently in the Nursery getting a circumcision. Mom reports that infant latches with ease on the R breast, but not the L breast. Breast exam was done. Nipples are similar in shape & size. Mom was reassured. I instructed Mom to offer the L side, but if he does not latch, then offer the R breast. Pump the L side to protect milk supply. Parents verbalized understanding.  Mom's UMR pump was provided.  Lurline HareRichey, Trisha Morandi [facility] Goodwin, 2:33 PM

## 2017-06-02 NOTE — Anesthesia Postprocedure Evaluation (Signed)
Anesthesia Post Note  Patient: Erin SpurlingMary Goodwin  Procedure(s) Performed: CESAREAN SECTION (N/A )     Patient location during evaluation: Mother Baby Anesthesia Type: Epidural Level of consciousness: awake, awake and alert and oriented Pain management: pain level controlled Vital Signs Assessment: post-procedure vital signs reviewed and stable Respiratory status: spontaneous breathing, nonlabored ventilation and respiratory function stable Cardiovascular status: stable Postop Assessment: no headache, no backache, no apparent nausea or vomiting, adequate PO intake and patient able to bend at knees (RN said she had to straight cath patient because patient can not void. Patient able to bend knees, no residual numbness. Told RN to notify OB doctor. ) Anesthetic complications: no    Last Vitals:  Vitals:   06/02/17 0347 06/02/17 0545  BP:  (!) 93/52  Pulse: 72 68  Resp:  18  Temp:  37 C  SpO2: 96% 95%    Last Pain:  Vitals:   06/02/17 0735  TempSrc:   PainSc: 0-No pain   Pain Goal: Patients Stated Pain Goal: 3 (06/02/17 0145)               Lakesa Coste

## 2017-06-02 NOTE — Progress Notes (Signed)
S:  Reports feeling well overall.  Some c/o of incision pain well controlled with ibuprofen.  Tolerating regular diet without N/V.  Up ad lib/ambulating.  Having difficulty emptying bladder.  Straight catheter by RN with 1400 ml out put.  Passing flatus.  No BM yet.  Reports bleeding as light, without clots.  Breastfeeding going well.  O: Vital signs BP (!) 93/52 (BP Location: Right Arm)   Pulse 68   Temp 98.6 F (37 C) (Oral)   Resp 18   Ht 5\' 6"  (1.676 m)   Wt 87.5 kg (193 lb)   SpO2 95%   BMI 31.15 kg/m  Labs  Recent Labs    06/01/17 0200 06/02/17 0640  WBC 14.3* 17.6*  HGB 10.7* 9.2*  PLT 360 318    Alert and O x 3. Abdomen: soft, non-tender, non-distended. Fundus: firm, non-tender, U-U, to the right Lochia light, without clots. Perineum:  No edema. Incision:  Warm, dry, well-approximated.  Honeycomb dressing in place  No drainage or s/s of infection noted.  A: PP Post-op Day 1 s/p primary c/s ID anemia, compounded by ABL anemia Post-operative Urinary retention Breastfeeding Doing well, stable  P:   Continue with current PP plan of care. Start Niferex and Magnesium oxide Encourage to attempt to urinate at least every two hours Consider indwelling catheter overnight if no void by 2 pm See lactation as needed   Judie BonusSarah DeyRN, SNM 06/02/2017 , 10:32 AM

## 2017-06-03 ENCOUNTER — Encounter (HOSPITAL_COMMUNITY): Payer: Self-pay | Admitting: *Deleted

## 2017-06-03 MED ORDER — MAGNESIUM OXIDE 400 (241.3 MG) MG PO TABS: 400.0000 mg | ORAL_TABLET | Freq: Every day | ORAL | 0 refills | Status: DC

## 2017-06-03 MED ORDER — POLYSACCHARIDE IRON COMPLEX 150 MG PO CAPS: 150.0000 mg | ORAL_CAPSULE | Freq: Every day | ORAL | 0 refills | Status: DC

## 2017-06-03 MED ORDER — PRENATAL MULTIVITAMIN CH: 1.0000 | ORAL_TABLET | Freq: Every day | ORAL | 4 refills | Status: DC

## 2017-06-03 MED ORDER — OXYCODONE-ACETAMINOPHEN 5-325 MG PO TABS: 1.0000 | ORAL_TABLET | ORAL | 0 refills | Status: DC | PRN

## 2017-06-03 MED ORDER — IBUPROFEN 600 MG PO TABS: 600.0000 mg | ORAL_TABLET | Freq: Four times a day (QID) | ORAL | 0 refills | Status: DC

## 2017-06-03 MED FILL — FERREX 150 CAPSULE: 150 | 45 days supply | Qty: 45 | Fill #0

## 2017-06-03 MED FILL — MAGNESIUM OXIDE 400 MG TAB: 400 (240 MG | 100 days supply | Qty: 100 | Fill #0

## 2017-06-03 MED FILL — IBUPROFEN 600 MG TABLET: 600 | 5 days supply | Qty: 30 | Fill #0

## 2017-06-03 MED FILL — OXYCODONE-ACETAMINOPHEN 5-3: 5-325 | 4 days supply | Qty: 20 | Fill #0

## 2017-06-03 NOTE — Progress Notes (Signed)
POSTOPERATIVE DAY # 2 S/P CS   S:         Reports feeling well - wants to go home             Tolerating po intake / no nausea / no vomiting / + flatus / no BM             Bleeding is light             Pain controlled with motrin and oxycodone             Up ad lib / ambulatory/ voiding QS without any further urinary retention yesterday  Newborn Breast   O:  VS: BP (!) 98/50 (BP Location: Left Arm)   Pulse 74   Temp 98.2 F (36.8 C)   Resp 18   Ht 5\' 6"  (1.676 m)   Wt 87.5 kg (193 lb)   SpO2 100%   BMI 31.15 kg/m    LABS:               Recent Labs    06/01/17 0200 06/02/17 0640  WBC 14.3* 17.6*  HGB 10.7* 9.2*  PLT 360 318               Bloodtype: --/--/A POS, A POS Performed at [facility], [address] [place], [place] [address]  (04/02 0200)  Rubella: Immune (08/24 0000)                                 Physical Exam:             Alert and Oriented X3  Lungs: Clear and unlabored  Heart: regular rate and rhythm / no mumurs  Abdomen: soft, non-tender, non-distended              Fundus: firm, non-tender, Ueven             Dressing intact              Incision:  approximated with suture / no erythema / no ecchymosis / no drainage  Perineum: intact  Lochia: light  Extremities: 1+ edema, no calf pain or tenderness, negative Homans  A:        POD # 2 S/P CS            IDA with compounded ABL anemia  P:        Routine postoperative care              DC home - [facility] booklet - instructions reveiwed     Marlinda Mikeanya Bailey CNM, MSN, [facility] 06/03/2017, 10:40 AM

## 2017-06-03 NOTE — Discharge Summary (Signed)
OB Discharge Summary  Patient Name: Erin Goodwin DOB: Aug 22, 1995 MRN: [id number]  Date of admission: 06/01/2017  Admitting diagnosis: 40.5 WEEKS CTX Intrauterine pregnancy: 1256w0d      Date of discharge: 06/03/2017    Discharge diagnosis: Term Pregnancy Delivered, Anemia and POD 2 s/p C-section arrest of labor      Prenatal history: G1P0   EDC : 05/27/2017, by Other Basis  [facility]  Primary provider : taavon Prenatal course complicated by IDA  Prenatal Labs: ABO, Rh: --/--/A POS, A POS Performed at [facility], [address] [place], [place] [address]  [phone](04/02 0200)  Antibody: NEG (04/02 0200) Rubella: Immune (08/24 0000)   RPR: Non Reactive (04/02 0200)  HBsAg: Negative (08/24 0000)  HIV: Non-reactive (08/24 0000)  GBS: Negative (02/21 0000)                                    Hospital course:  Onset of Labor With Unplanned C/S  22 y.o. yo G1P0 at 4856w0d was admitted in Active Labor on 06/01/2017. Patient had a labor course significant for arrest of active labor at 6cm -2 station despite augmentation. Membrane Rupture Time/Date: 5:26 AM ,06/01/2017   The patient went for cesarean section due to Arrest of Dilation and Arrest of Descent, and delivered a Viable infant,06/01/2017  Details of operation can be found in separate operative note. Patient had an uncomplicated postpartum course.  She is ambulating,tolerating a regular diet, passing flatus, and urinating well.  Patient is discharged home in stable condition 06/03/17. Augmentation: Pitocin Delivering PROVIDER: Olivia MackieAAVON, RICHARD                                                            Complications: None  Newborn Data: Live born female  Birth Weight: 7 lb 13.9 oz (3569 g) APGAR: 8, 9  Newborn Delivery   Birth date/time:  06/01/2017 10:58:00 Delivery type:  C-Section, Low Vertical C-section categorization:  Primary     Baby Feeding: Breast Disposition:home with mother  Post  partum procedures:none  Labs: Lab Results  Component Value Date   WBC 17.6 (H) 06/02/2017   HGB 9.2 (L) 06/02/2017   HCT 27.9 (L) 06/02/2017   MCV 90.9 06/02/2017   PLT 318 06/02/2017   CMP Latest Ref Rng & Units 06/24/2016  Glucose 70 - 99 mg/dL 90  BUN 6 - 23 mg/dL 7  Creatinine 1.910.40 - 4.781.20 mg/dL 2.950.75  Sodium 621135 - 308145 mEq/L 140  Potassium 3.5 - 5.1 mEq/L 3.8  Chloride 96 - 112 mEq/L 105  CO2 19 - 32 mEq/L 28  Calcium 8.4 - 10.5 mg/dL 9.7  Total Protein 6.0 - 8.3 g/dL 7.0  Total Bilirubin 0.2 - 1.2 mg/dL 0.3  Alkaline Phos 39 - 117 U/L 42  AST 0 - 37 U/L 14  ALT 0 - 35 U/L 9      Physical Exam @ time of discharge:  Vitals:   06/02/17 1043 06/02/17 1709 06/02/17 2015 06/03/17 0500  BP: (!) 94/54 107/68 113/86 (!) 98/50  Pulse: (!) 55 89 78 74  Resp: 19 18 18 18   Temp: 98.4 F (36.9 C) 98.6 F (37 C)  98 F (36.7 C) 98.2 F (36.8 C)  TempSrc:  Axillary Oral   SpO2: 98% 100% 100% 100%  Weight:      Height:        General: alert, cooperative and no distress Lochia: appropriate Uterine Fundus: firm Perineum: intact Incision: Healing well with no significant drainage Extremities: DVT Evaluation: No evidence of DVT seen on physical exam.   Discharge instructions:  "Baby and Me Booklet" and [facility] Booklet  Discharge Medications:   Diet: routine diet  Activity: Advance as tolerated. Pelvic rest x 6 weeks.   Follow up:6 weeks    Signed: Marlinda Mike CNM, MSN, [facility] 06/03/2017, 10:44 AM

## 2017-06-03 NOTE — Lactation Note (Signed)
This note was copied from a baby's chart. Lactation Consultation Note: Mom reports baby has been nursing well- doing both breasts now without problem. Reports nipples are a little tender but intact. Has Medela DEBP for home. Reviewed engorgement prevention and treatment. Reviewed our phone number, OP appointments and BFSG as resources for support after DC. No questions at present. To call prn  Patient Name: Boy Erin SpurlingMary Goodwin JYNWG'NToday's Date: 06/03/2017 Reason for consult: Follow-up assessment   Maternal Data Formula Feeding for Exclusion: No Has patient been taught Hand Expression?: Yes Does the patient have breastfeeding experience prior to this delivery?: No  Feeding  LATCH Score                   Interventions    Lactation Tools Discussed/Used     Consult Status Consult Status: Complete    Pamelia HoitWeeks, Brylin Stanislawski D 06/03/2017, 10:08 AM

## 2017-06-09 ENCOUNTER — Encounter (HOSPITAL_COMMUNITY): Payer: Self-pay | Admitting: *Deleted

## 2017-07-14 MED FILL — NORETHINDRONE 0.35 MG TAB: 0.35 | 84 days supply | Qty: 84 | Fill #0

## 2017-07-15 ENCOUNTER — Encounter: Payer: Self-pay | Admitting: Physician Assistant

## 2017-07-19 ENCOUNTER — Encounter: Payer: Self-pay | Admitting: Emergency Medicine

## 2017-07-19 ENCOUNTER — Encounter: Payer: Self-pay | Admitting: Physician Assistant

## 2017-07-19 ENCOUNTER — Ambulatory Visit (INDEPENDENT_AMBULATORY_CARE_PROVIDER_SITE_OTHER): Payer: 59 | Admitting: Physician Assistant

## 2017-07-19 ENCOUNTER — Other Ambulatory Visit: Payer: Self-pay

## 2017-07-19 VITALS — BP 100/68 | HR 94 | Temp 98.3°F | Resp 16 | Ht 65.0 in | Wt 173.8 lb

## 2017-07-19 DIAGNOSIS — Z23 Encounter for immunization: Secondary | ICD-10-CM

## 2017-07-19 DIAGNOSIS — H6993 Unspecified Eustachian tube disorder, bilateral: Secondary | ICD-10-CM

## 2017-07-19 DIAGNOSIS — H6983 Other specified disorders of Eustachian tube, bilateral: Secondary | ICD-10-CM

## 2017-07-19 DIAGNOSIS — Z0000 Encounter for general adult medical examination without abnormal findings: Secondary | ICD-10-CM

## 2017-07-19 DIAGNOSIS — Z111 Encounter for screening for respiratory tuberculosis: Secondary | ICD-10-CM

## 2017-07-19 LAB — LIPID PANEL
CHOL/HDL RATIO: 3
Cholesterol: 185 mg/dL (ref 0–200)
HDL: 53.9 mg/dL (ref >39.00)
LDL CALC: 119 mg/dL — AB (ref 0–99)
NONHDL: 130.84
Triglycerides: 60 mg/dL (ref 0.0–149.0)
VLDL: 12 mg/dL (ref 0.0–40.0)

## 2017-07-19 LAB — COMPREHENSIVE METABOLIC PANEL
ALT: 12 U/L (ref 0–35)
AST: 15 U/L (ref 0–37)
Albumin: 4.3 g/dL (ref 3.5–5.2)
Alkaline Phosphatase: 77 U/L (ref 39–117)
BUN: 10 mg/dL (ref 6–23)
CHLORIDE: 106 meq/L (ref 96–112)
CO2: 28 mEq/L (ref 19–32)
Calcium: 9.7 mg/dL (ref 8.4–10.5)
Creatinine, Ser: 0.79 mg/dL (ref 0.40–1.20)
GFR: 95.31 mL/min (ref >60.00)
GLUCOSE: 84 mg/dL (ref 70–99)
POTASSIUM: 4.6 meq/L (ref 3.5–5.1)
Sodium: 141 mEq/L (ref 135–145)
Total Bilirubin: 0.2 mg/dL (ref 0.2–1.2)
Total Protein: 7.1 g/dL (ref 6.0–8.3)

## 2017-07-19 LAB — CBC WITH DIFFERENTIAL/PLATELET
Basophils Absolute: 0 10*3/uL (ref 0.0–0.1)
Basophils Relative: 0.6 % (ref 0.0–3.0)
Eosinophils Absolute: 0 10*3/uL (ref 0.0–0.7)
Eosinophils Relative: 0.7 % (ref 0.0–5.0)
HCT: 38.5 % (ref 36.0–46.0)
Hemoglobin: 12.8 g/dL (ref 12.0–15.0)
LYMPHS ABS: 2.6 10*3/uL (ref 0.7–4.0)
Lymphocytes Relative: 43 % (ref 12.0–46.0)
MCHC: 33.2 g/dL (ref 30.0–36.0)
MCV: 89.7 fl (ref 78.0–100.0)
MONO ABS: 0.4 10*3/uL (ref 0.1–1.0)
MONOS PCT: 6.3 % (ref 3.0–12.0)
NEUTROS ABS: 3 10*3/uL (ref 1.4–7.7)
NEUTROS PCT: 49.4 % (ref 43.0–77.0)
Platelets: 364 10*3/uL (ref 150.0–400.0)
RBC: 4.3 Mil/uL (ref 3.87–5.11)
RDW: 15.3 % (ref 11.5–15.5)
WBC: 6.1 10*3/uL (ref 4.0–10.5)

## 2017-07-19 LAB — TSH: TSH: 1.24 u[IU]/mL (ref 0.35–4.50)

## 2017-07-19 NOTE — Progress Notes (Signed)
Patient presents to clinic today for annual exam.  Patient is fasting for labs.  Acute Concerns: Patient endorses 2 weeks of ear fullness and pressure bilaterally with occasional pain and popping. Denies tinnitus or change to hearing. Denies drainage from the ear. Denies vertigo. Denies fever, chills or URI symptoms. Has just started Flonase within the past couple of days.  Health Maintenance: Immunizations -- up-to-date.  PAP -- Last in 2017. Every 3 years with GYN.   Past Medical History:  Diagnosis Date  . Chronic constipation   . Chronic posterior anal fissure   . External hemorrhoids   . Family history of early CAD   . Family history of headaches   . GERD (gastroesophageal reflux disease)   . Hirsutism   . History of chicken pox   . Hypotension   . Palpitations   . PCOS (polycystic ovarian syndrome)     Past Surgical History:  Procedure Laterality Date  . CESAREAN SECTION N/A 06/01/2017   Procedure: CESAREAN SECTION;  Surgeon: Olivia Mackie, MD;  Location: [facility] BIRTHING SUITES;  Service: Obstetrics;  Laterality: N/A;  . TONSILLECTOMY AND ADENOIDECTOMY      Current Outpatient Medications on File Prior to Visit  Medication Sig Dispense Refill  . linaclotide (LINZESS) 290 MCG CAPS capsule Take 290 mcg by mouth daily before breakfast.    . norethindrone (MICRONOR,CAMILA,ERRIN) 0.35 MG tablet   6  . pantoprazole (PROTONIX) 40 MG tablet Take 40 mg by mouth daily.    . Prenatal Vit-Fe Fumarate-FA (PRENATAL MULTIVITAMIN) TABS tablet Take 1 tablet by mouth daily at 12 noon. 90 tablet 4  . PROCTOZONE-HC 2.5 % rectal cream      No current facility-administered medications on file prior to visit.     No Known Allergies  Family History  Problem Relation Age of Onset  . Hyperlipidemia Mother   . Diabetes Mother   . Cancer Mother 12       Uterus,Skin,Thyroid  . Heart attack Mother 40  . Irritable bowel syndrome Mother   . COPD Mother   . Heart attack Father   . Cancer  Maternal Aunt 30       Breast  . Diabetes Maternal Grandfather     Social History   Socioeconomic History  . Marital status: Married    Spouse name: Not on file  . Number of children: Not on file  . Years of education: Not on file  . Highest education level: Not on file  Occupational History  . Not on file  Social Needs  . Financial resource strain: Not on file  . Food insecurity:    Worry: Not on file    Inability: Not on file  . Transportation needs:    Medical: Not on file    Non-medical: Not on file  Tobacco Use  . Smoking status: Never Smoker  . Smokeless tobacco: Never Used  Substance and Sexual Activity  . Alcohol use: Yes    Alcohol/week: 0.0 oz    Comment: occasional  . Drug use: No  . Sexual activity: Yes    Partners: Male    Birth control/protection: Pill  Lifestyle  . Physical activity:    Days per week: Not on file    Minutes per session: Not on file  . Stress: Not on file  Relationships  . Social connections:    Talks on phone: Not on file    Gets together: Not on file    Attends religious service: Not on file  Active member of club or organization: Not on file    Attends meetings of clubs or organizations: Not on file    Relationship status: Not on file  . Intimate partner violence:    Fear of current or ex partner: Not on file    Emotionally abused: Not on file    Physically abused: Not on file    Forced sexual activity: Not on file  Other Topics Concern  . Not on file  Social History Narrative   single, no children   [employer] [place]    had a photography business in the past   One caffeinated beverage daily   05/28/2016   Review of Systems  Constitutional: Negative for fever and weight loss.  HENT: Negative for ear discharge, ear pain, hearing loss and tinnitus.   Eyes: Negative for blurred vision, double vision, photophobia and pain.  Respiratory: Negative for cough and shortness of breath.   Cardiovascular: Negative for  chest pain and palpitations.  Gastrointestinal: Negative for abdominal pain, blood in stool, constipation, diarrhea, heartburn, melena, nausea and vomiting.  Genitourinary: Negative for dysuria, flank pain, frequency, hematuria and urgency.  Musculoskeletal: Negative for falls.  Neurological: Negative for dizziness, loss of consciousness and headaches.  Endo/Heme/Allergies: Negative for environmental allergies.  Psychiatric/Behavioral: Negative for depression, hallucinations, substance abuse and suicidal ideas. The patient is not nervous/anxious and does not have insomnia.    BP 100/68   Pulse 94   Temp 98.3 F (36.8 C) (Oral)   Resp 16   Ht  (1.651 m)   Wt 173 lb 12.8 oz (78.8 kg)   SpO2 98%   BMI 28.92 kg/m   Physical Exam  Constitutional: She is oriented to person, place, and time.  HENT:  Head: Normocephalic and atraumatic.  Right Ear: External ear and ear canal normal. Tympanic membrane is retracted. A middle ear effusion (serous) is present.  Left Ear: External ear and ear canal normal. A middle ear effusion (serous) is present.  Nose: Nose normal. No mucosal edema.  Mouth/Throat: Uvula is midline, oropharynx is clear and moist and mucous membranes are normal. No oropharyngeal exudate or posterior oropharyngeal erythema.  Eyes: Pupils are equal, round, and reactive to light. Conjunctivae are normal.  Neck: Neck supple. No thyromegaly present.  Cardiovascular: Normal rate, regular rhythm, normal heart sounds and intact distal pulses.  Pulmonary/Chest: Effort normal and breath sounds normal. No respiratory distress. She has no wheezes. She has no rales.  Abdominal: Soft. Bowel sounds are normal. She exhibits no distension and no mass. There is no tenderness. There is no rebound and no guarding.  Lymphadenopathy:    She has no cervical adenopathy.  Neurological: She is alert and oriented to person, place, and time. No cranial nerve deficit.  Skin: Skin is warm and dry. No  rash noted.  Vitals reviewed.   Recent Results (from the past 2160 hour(s))  OB RESULT CONSOLE Group B Strep     Status: None   Collection Time: 04/22/17 12:00 AM  Result Value Ref Range   GBS Negative   CBC     Status: Abnormal   Collection Time: 06/01/17  2:00 AM  Result Value Ref Range   WBC 14.3 (H) 4.0 - 10.5 K/uL   RBC 3.63 (L) 3.87 - 5.11 MIL/uL   Hemoglobin 10.7 (L) 12.0 - 15.0 g/dL   HCT 16.1 (L) 09.6 - 04.5 %   MCV 90.4 78.0 - 100.0 fL   MCH 29.5 26.0 - 34.0  pg   MCHC 32.6 30.0 - 36.0 g/dL   RDW 96.0 45.4 - 09.8 %   Platelets 360 150 - 400 K/uL    Comment: Performed at [facility], [address], [place], [place] [address]  Type and screen [facility]     Status: None   Collection Time: 06/01/17  2:00 AM  Result Value Ref Range   ABO/RH(D) A POS    Antibody Screen NEG    Sample Expiration      06/04/2017 Performed at [facility], [address], [place], [place] [address]   RPR     Status: None   Collection Time: 06/01/17  2:00 AM  Result Value Ref Range   RPR Ser Ql Non Reactive Non Reactive    Comment: (NOTE) Performed At: [facility] [address] [place], [place] [id number] Jolene Schimke MD QI:[id number] Performed at [facility], [address] [place], [place] [address]   ABO/Rh     Status: None   Collection Time: 06/01/17  2:00 AM  Result Value Ref Range   ABO/RH(D)      A POS Performed at [facility], [address], [place], [place] [address]   CBC     Status: Abnormal   Collection Time: 06/02/17  6:40 AM  Result Value Ref Range   WBC 17.6 (H) 4.0 - 10.5 K/uL   RBC 3.07 (L) 3.87 - 5.11 MIL/uL   Hemoglobin 9.2 (L) 12.0 - 15.0 g/dL   HCT 53.6 (L) 64.4 - 03.4 %   MCV 90.9 78.0 - 100.0 fL   MCH 30.0 26.0 - 34.0 pg   MCHC 33.0 30.0 - 36.0 g/dL   RDW 74.2 59.5 - 63.8 %   Platelets 318 150 - 400 K/uL    Comment: Performed at [facility], [address], [place], [place] [address]  Collect  bld for placenta donatation     Status: None   Collection Time: 06/02/17  6:41 AM  Result Value Ref Range   Placenta donation bld collect COLLECTED BY LABORATORY     Comment: Performed at [facility], [address] [place], [place] [address]    Assessment/Plan: Eustachian tube dysfunction, bilateral Start saline nasal rinse and self-insufflation once daily. Start claritin. Continue Flonase.  Need for vaccination to prevent tuberculosis PPD placed by nursing staff.  Visit for preventive health examination Depression screen negative. Health Maintenance reviewed. Preventive schedule discussed and handout given in AVS. Hearing screen and vision screen today -- both passed, vision 20/20 BL corrected. Will obtain fasting labs today.     Piedad Climes, PA-C

## 2017-07-19 NOTE — Assessment & Plan Note (Signed)
Depression screen negative. Health Maintenance reviewed. Preventive schedule discussed and handout given in AVS. Hearing screen and vision screen today -- both passed, vision 20/20 BL corrected. Will obtain fasting labs today.

## 2017-07-19 NOTE — Patient Instructions (Addendum)
Please go to the lab for blood work.   Our office will call you with your results unless you have chosen to receive results via MyChart.  If your blood work is normal we will follow-up each year for physicals and as scheduled for chronic medical problems.  If anything is abnormal we will treat accordingly and get you in for a follow-up.  Please start saline nasal rinses daily. Hold the nose and blow to pop the eustachian tubes open.  Start a daily Claritin.  Call if not improving as we may need ENT assessment.   Please return Wednesday after 11 AM until before Thursday at 10 AM to have the TB test read.   Preventive Care 18-39 Years, Female Preventive care refers to lifestyle choices and visits with your health care provider that can promote health and wellness. What does preventive care include?  A yearly physical exam. This is also called an annual well check.  Dental exams once or twice a year.  Routine eye exams. Ask your health care provider how often you should have your eyes checked.  Personal lifestyle choices, including: ? Daily care of your teeth and gums. ? Regular physical activity. ? Eating a healthy diet. ? Avoiding tobacco and drug use. ? Limiting alcohol use. ? Practicing safe sex. ? Taking vitamin and mineral supplements as recommended by your health care provider. What happens during an annual well check? The services and screenings done by your health care provider during your annual well check will depend on your age, overall health, lifestyle risk factors, and family history of disease. Counseling Your health care provider may ask you questions about your:  Alcohol use.  Tobacco use.  Drug use.  Emotional well-being.  Home and relationship well-being.  Sexual activity.  Eating habits.  Work and work Statistician.  Method of birth control.  Menstrual cycle.  Pregnancy history.  Screening You may have the following tests or  measurements:  Height, weight, and BMI.  Diabetes screening. This is done by checking your blood sugar (glucose) after you have not eaten for a while (fasting).  Blood pressure.  Lipid and cholesterol levels. These may be checked every 5 years starting at age 55.  Skin check.  Hepatitis C blood test.  Hepatitis B blood test.  Sexually transmitted disease (STD) testing.  BRCA-related cancer screening. This may be done if you have a family history of breast, ovarian, tubal, or peritoneal cancers.  Pelvic exam and Pap test. This may be done every 3 years starting at age 24. Starting at age 32, this may be done every 5 years if you have a Pap test in combination with an HPV test.  Discuss your test results, treatment options, and if necessary, the need for more tests with your health care provider. Vaccines Your health care provider may recommend certain vaccines, such as:  Influenza vaccine. This is recommended every year.  Tetanus, diphtheria, and acellular pertussis (Tdap, Td) vaccine. You may need a Td booster every 10 years.  Varicella vaccine. You may need this if you have not been vaccinated.  HPV vaccine. If you are 31 or younger, you may need three doses over 6 months.  Measles, mumps, and rubella (MMR) vaccine. You may need at least one dose of MMR. You may also need a second dose.  Pneumococcal 13-valent conjugate (PCV13) vaccine. You may need this if you have certain conditions and were not previously vaccinated.  Pneumococcal polysaccharide (PPSV23) vaccine. You may need one or two  doses if you smoke cigarettes or if you have certain conditions.  Meningococcal vaccine. One dose is recommended if you are age 46-21 years and a first-year college student living in a residence hall, or if you have one of several medical conditions. You may also need additional booster doses.  Hepatitis A vaccine. You may need this if you have certain conditions or if you travel or work  in places where you may be exposed to hepatitis A.  Hepatitis B vaccine. You may need this if you have certain conditions or if you travel or work in places where you may be exposed to hepatitis B.  Haemophilus influenzae type b (Hib) vaccine. You may need this if you have certain risk factors.  Talk to your health care provider about which screenings and vaccines you need and how often you need them. This information is not intended to replace advice given to you by your health care provider. Make sure you discuss any questions you have with your health care provider. Document Released: 04/14/2001 Document Revised: 11/06/2015 Document Reviewed: 12/18/2014 Elsevier Interactive Patient Education  [employer].

## 2017-07-19 NOTE — Assessment & Plan Note (Signed)
PPD placed by nursing staff.

## 2017-07-19 NOTE — Assessment & Plan Note (Signed)
Start saline nasal rinse and self-insufflation once daily. Start claritin. Continue Flonase.

## 2017-07-21 ENCOUNTER — Encounter: Payer: Self-pay | Admitting: Emergency Medicine

## 2017-07-21 LAB — TB SKIN TEST
Induration: 0 mm
TB SKIN TEST: NEGATIVE

## 2017-07-24 ENCOUNTER — Encounter (HOSPITAL_COMMUNITY): Payer: Self-pay | Admitting: Obstetrics and Gynecology

## 2017-08-05 ENCOUNTER — Other Ambulatory Visit (INDEPENDENT_AMBULATORY_CARE_PROVIDER_SITE_OTHER): Payer: 59

## 2017-08-05 ENCOUNTER — Telehealth: Payer: Self-pay | Admitting: Physician Assistant

## 2017-08-05 DIAGNOSIS — Z111 Encounter for screening for respiratory tuberculosis: Secondary | ICD-10-CM

## 2017-08-05 NOTE — Telephone Encounter (Signed)
Orders have been placed. Contacted patient she is coming in at 2:30 this afternoon to have this done.

## 2017-08-05 NOTE — Addendum Note (Signed)
Addended by: Lenis DickinsonILLARD, [name] M on: 08/05/2017 01:01 PM   Modules accepted: Orders

## 2017-08-05 NOTE — Telephone Encounter (Signed)
Fine with me. Ok to place order.

## 2017-08-05 NOTE — Telephone Encounter (Addendum)
Okay to do TB Blood draw?

## 2017-08-05 NOTE — Telephone Encounter (Signed)
Copied from CRM [phone]. 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 Pt is wanting to come in today to have labs if ok. Please call pt to advise.

## 2017-08-05 NOTE — Addendum Note (Signed)
Addended by: Lenis DickinsonILLARD, Glennda Weatherholtz M on: 08/05/2017 01:00 PM   Modules accepted: Orders

## 2017-08-07 LAB — QUANTIFERON-TB GOLD PLUS
NIL: 0.04 [IU]/mL
QUANTIFERON-TB GOLD PLUS: NEGATIVE
TB1-NIL: 0.01 IU/mL

## 2017-09-29 MED FILL — FLUCONAZOLE 150 MG TABS: 150 | 3 days supply | Qty: 3 | Fill #0

## 2017-10-07 MED FILL — NORETHINDRONE 0.35 MG TAB: 0.35 | 84 days supply | Qty: 84 | Fill #1

## 2017-11-11 ENCOUNTER — Telehealth: Payer: Self-pay

## 2017-11-11 ENCOUNTER — Telehealth: Payer: Self-pay | Admitting: Physician Assistant

## 2017-11-11 NOTE — Telephone Encounter (Signed)
Forms have been placed in appropriate back area for completion.

## 2017-11-11 NOTE — Telephone Encounter (Signed)
Immunization records have been printed and will place up front for patient to pick up.   FYI  Copied from CRM [phone]. Topic: Inquiry >> Nov 11, 2017 10:51 AM Lorrine KinMcGee, Demi B, NT wrote: Reason for CRM: Patient calling and states that she is needing a printed immunization record for school. Please advise. Patient is also bringing forms to have pcp fill out for school. Is aware that it could take 5-7 business days to complete. Patient on her way. 30-45 mins away.

## 2017-11-11 NOTE — Telephone Encounter (Signed)
Pt dropped off college forms to be completed by provider for [facility]. Placed in bin upfront with charge sheet.

## 2017-11-12 ENCOUNTER — Telehealth: Payer: Self-pay

## 2017-11-12 ENCOUNTER — Other Ambulatory Visit (INDEPENDENT_AMBULATORY_CARE_PROVIDER_SITE_OTHER): Payer: 59

## 2017-11-12 DIAGNOSIS — Z0184 Encounter for antibody response examination: Secondary | ICD-10-CM

## 2017-11-12 DIAGNOSIS — Z23 Encounter for immunization: Secondary | ICD-10-CM

## 2017-11-12 NOTE — Telephone Encounter (Signed)
Forms completed. Patient needing titers for MMR and varicella per paperwork. Pt scheduled for lab today.

## 2017-11-12 NOTE — Telephone Encounter (Signed)
Orders have been placed.

## 2017-11-12 NOTE — Telephone Encounter (Signed)
Called patient and she stated that she has not had her titers done for Rubella or Varicella. I have scheduled a lab appointment today for 2:30pm.   Titers have not been ordered yet.

## 2017-11-15 LAB — MEASLES/MUMPS/RUBELLA IMMUNITY
MUMPS IGG: 32.2 [AU]/ml
Rubella: 2.54 index
Rubeola IgG: 68.4 AU/mL

## 2017-11-15 LAB — VARICELLA ZOSTER ANTIBODY, IGG: VARICELLA IGG: 1288 {index}

## 2017-11-26 ENCOUNTER — Other Ambulatory Visit: Payer: Self-pay

## 2017-11-26 ENCOUNTER — Ambulatory Visit (INDEPENDENT_AMBULATORY_CARE_PROVIDER_SITE_OTHER): Payer: 59 | Admitting: Physician Assistant

## 2017-11-26 ENCOUNTER — Encounter: Payer: Self-pay | Admitting: Physician Assistant

## 2017-11-26 VITALS — BP 100/80 | HR 67 | Temp 97.9°F | Resp 14 | Ht 65.0 in | Wt 169.0 lb

## 2017-11-26 DIAGNOSIS — K2900 Acute gastritis without bleeding: Secondary | ICD-10-CM | POA: Diagnosis not present

## 2017-11-26 MED ORDER — OMEPRAZOLE 20 MG PO CPDR: 20.0000 mg | DELAYED_RELEASE_CAPSULE | Freq: Every day | ORAL | 0 refills | Status: DC

## 2017-11-26 NOTE — Patient Instructions (Signed)
Please keep well-hydrated and get plenty of rest.  Follow the dietary recommendations below.  Use the Prilosec daily over the next 7-10 days to help calm things down.  You can breat feed with omeprazole use short-term but you can choose to stick to formula feeds while taking.    Food Choices for Gastroesophageal Reflux Disease, Adult When you have gastroesophageal reflux disease (GERD), the foods you eat and your eating habits are very important. Choosing the right foods can help ease your discomfort. What guidelines do I need to follow?  Choose fruits, vegetables, whole grains, and low-fat dairy products.  Choose low-fat meat, fish, and poultry.  Limit fats such as oils, salad dressings, butter, nuts, and avocado.  Keep a food diary. This helps you identify foods that cause symptoms.  Avoid foods that cause symptoms. These may be different for everyone.  Eat small meals often instead of 3 large meals a day.  Eat your meals slowly, in a place where you are relaxed.  Limit fried foods.  Cook foods using methods other than frying.  Avoid drinking alcohol.  Avoid drinking large amounts of liquids with your meals.  Avoid bending over or lying down until 2-3 hours after eating. What foods are not recommended? These are some foods and drinks that may make your symptoms worse: Vegetables Tomatoes. Tomato juice. Tomato and spaghetti sauce. Chili peppers. Onion and garlic. Horseradish. Fruits Oranges, grapefruit, and lemon (fruit and juice). Meats High-fat meats, fish, and poultry. This includes hot dogs, ribs, ham, sausage, salami, and bacon. Dairy Whole milk and chocolate milk. Sour cream. Cream. Butter. Ice cream. Cream cheese. Drinks Coffee and tea. Bubbly (carbonated) drinks or energy drinks. Condiments Hot sauce. Barbecue sauce. Sweets/Desserts Chocolate and cocoa. Donuts. Peppermint and spearmint. Fats and Oils High-fat foods. This includes [place] fries and potato  chips. Other Vinegar. Strong spices. This includes black pepper, white pepper, red pepper, cayenne, curry powder, cloves, ginger, and chili powder. The items listed above may not be a complete list of foods and drinks to avoid. Contact your dietitian for more information. This information is not intended to replace advice given to you by your health care provider. Make sure you discuss any questions you have with your health care provider. Document Released: 08/18/2011 Document Revised: 07/25/2015 Document Reviewed: 12/21/2012 Elsevier Interactive Patient Education  2017 [employer].   Food Choices to Help Relieve Diarrhea, Adult When you have diarrhea, the foods you eat and your eating habits are very important. Choosing the right foods and drinks can help:  Relieve diarrhea.  Replace lost fluids and nutrients.  Prevent dehydration.  What general guidelines should I follow? Relieving diarrhea  Choose foods with less than 2 g or .07 oz. of fiber per serving.  Limit fats to less than 8 tsp (38 g or 1.34 oz.) a day.  Avoid the following: ? Foods and beverages sweetened with high-fructose corn syrup, honey, or sugar alcohols such as xylitol, sorbitol, and mannitol. ? Foods that contain a lot of fat or sugar. ? Fried, greasy, or spicy foods. ? High-fiber grains, breads, and cereals. ? Raw fruits and vegetables.  Eat foods that are rich in probiotics. These foods include dairy products such as yogurt and fermented milk products. They help increase healthy bacteria in the stomach and intestines (gastrointestinal tract, or GI tract).  If you have lactose intolerance, avoid dairy products. These may make your diarrhea worse.  Take medicine to help stop diarrhea (antidiarrheal medicine) only as told by your health  care provider. Replacing nutrients  Eat small meals or snacks every 3-4 hours.  Eat bland foods, such as white rice, toast, or baked potato, until your diarrhea starts to  get better. Gradually reintroduce nutrient-rich foods as tolerated or as told by your health care provider. This includes: ? Well-cooked protein foods. ? Peeled, seeded, and soft-cooked fruits and vegetables. ? Low-fat dairy products.  Take vitamin and mineral supplements as told by your health care provider. Preventing dehydration   Start by sipping water or a special solution to prevent dehydration (oral rehydration solution, ORS). Urine that is clear or pale yellow means that you are getting enough fluid.  Try to drink at least 8-10 cups of fluid each day to help replace lost fluids.  You may add other liquids in addition to water, such as clear juice or decaffeinated sports drinks, as tolerated or as told by your health care provider.  Avoid drinks with caffeine, such as coffee, tea, or soft drinks.  Avoid alcohol. What foods are recommended? The items listed may not be a complete list. Talk with your health care provider about what dietary choices are best for you. Grains White rice. White, [place], or pita breads (fresh or toasted), including plain rolls, buns, or bagels. White pasta. Saltine, soda, or graham crackers. Pretzels. Low-fiber cereal. Cooked cereals made with water (such as cornmeal, farina, or cream cereals). Plain muffins. Matzo. Melba toast. Zwieback. Vegetables Potatoes (without the skin). Most well-cooked and canned vegetables without skins or seeds. Tender lettuce. Fruits Apple sauce. Fruits canned in juice. Cooked apricots, cherries, grapefruit, peaches, pears, or plums. Fresh bananas and cantaloupe. Meats and other protein foods Baked or boiled chicken. Eggs. Tofu. Fish. Seafood. Smooth nut butters. Ground or well-cooked tender beef, ham, veal, lamb, pork, or poultry. Dairy Plain yogurt, kefir, and unsweetened liquid yogurt. Lactose-free milk, buttermilk, skim milk, or soy milk. Low-fat or nonfat hard cheese. Beverages Water. Low-calorie sports drinks. Fruit  juices without pulp. Strained tomato and vegetable juices. Decaffeinated teas. Sugar-free beverages not sweetened with sugar alcohols. Oral rehydration solutions, if approved by your health care provider. Seasoning and other foods Bouillon, broth, or soups made from recommended foods. What foods are not recommended? The items listed may not be a complete list. Talk with your health care provider about what dietary choices are best for you. Grains Whole grain, whole wheat, bran, or rye breads, rolls, pastas, and crackers. Wild or brown rice. Whole grain or bran cereals. Barley. Oats and oatmeal. Corn tortillas or taco shells. Granola. Popcorn. Vegetables Raw vegetables. Fried vegetables. Cabbage, broccoli, Brussels sprouts, artichokes, baked beans, beet greens, corn, kale, legumes, peas, sweet potatoes, and yams. Potato skins. Cooked spinach and cabbage. Fruits Dried fruit, including raisins and dates. Raw fruits. Stewed or dried prunes. Canned fruits with syrup. Meat and other protein foods Fried or fatty meats. Deli meats. Chunky nut butters. Nuts and seeds. Beans and lentils. Tomasa Blase. Hot dogs. Sausage. Dairy High-fat cheeses. Whole milk, chocolate milk, and beverages made with milk, such as milk shakes. Half-and-half. Cream. sour cream. Ice cream. Beverages Caffeinated beverages (such as coffee, tea, soda, or energy drinks). Alcoholic beverages. Fruit juices with pulp. Prune juice. Soft drinks sweetened with high-fructose corn syrup or sugar alcohols. High-calorie sports drinks. Fats and oils Butter. Cream sauces. Margarine. Salad oils. Plain salad dressings. Olives. Avocados. Mayonnaise. Sweets and desserts Sweet rolls, doughnuts, and sweet breads. Sugar-free desserts sweetened with sugar alcohols such as xylitol and sorbitol. Seasoning and other foods Honey. Hot sauce. Chili powder.  Gravy. Cream-based or milk-based soups. Pancakes and waffles. Summary  When you have diarrhea, the foods  you eat and your eating habits are very important.  Make sure you get at least 8-10 cups of fluid each day, or enough to keep your urine clear or pale yellow.  Eat bland foods and gradually reintroduce healthy, nutrient-rich foods as tolerated, or as told by your health care provider.  Avoid high-fiber, fried, greasy, or spicy foods. This information is not intended to replace advice given to you by your health care provider. Make sure you discuss any questions you have with your health care provider. Document Released: 05/09/2003 Document Revised: 02/14/2016 Document Reviewed: 02/14/2016 Elsevier Interactive Patient Education  [employer].

## 2017-11-26 NOTE — Progress Notes (Signed)
Patient presents to clinic today c/o 4-5 days of loose stools now with mild, intermittent LUQ pain. Denies fever, chills, nausea or vomiting. Husband has had similar symptoms. Denies recent travel or strange food source. Denies noted heartburn. Denies tenesmus, melena or hematochezia. .   Past Medical History:  Diagnosis Date  . Chronic constipation   . Chronic posterior anal fissure   . External hemorrhoids   . Family history of early CAD   . Family history of headaches   . GERD (gastroesophageal reflux disease)   . Hirsutism   . History of chicken pox   . Hypotension   . Palpitations   . PCOS (polycystic ovarian syndrome)     Current Outpatient Medications on File Prior to Visit  Medication Sig Dispense Refill  . norethindrone (MICRONOR,CAMILA,ERRIN) 0.35 MG tablet   6  . PROCTOZONE-HC 2.5 % rectal cream     . linaclotide (LINZESS) 290 MCG CAPS capsule Take 290 mcg by mouth daily before breakfast.    . pantoprazole (PROTONIX) 40 MG tablet Take 40 mg by mouth daily.     No current facility-administered medications on file prior to visit.     No Known Allergies  Family History  Problem Relation Age of Onset  . Hyperlipidemia Mother   . Diabetes Mother   . Cancer Mother 22       Uterus,Skin,Thyroid  . Heart attack Mother 39  . Irritable bowel syndrome Mother   . COPD Mother   . Heart attack Father   . Cancer Maternal Aunt 30       Breast  . Diabetes Maternal Grandfather     Social History   Socioeconomic History  . Marital status: Married    Spouse name: Not on file  . Number of children: Not on file  . Years of education: Not on file  . Highest education level: Not on file  Occupational History  . Not on file  Social Needs  . Financial resource strain: Not on file  . Food insecurity:    Worry: Not on file    Inability: Not on file  . Transportation needs:    Medical: Not on file    Non-medical: Not on file  Tobacco Use  . Smoking status: Never Smoker   . Smokeless tobacco: Never Used  Substance and Sexual Activity  . Alcohol use: Yes    Alcohol/week: 0.0 standard drinks    Comment: occasional  . Drug use: No  . Sexual activity: Yes    Partners: Male    Birth control/protection: Pill  Lifestyle  . Physical activity:    Days per week: Not on file    Minutes per session: Not on file  . Stress: Not on file  Relationships  . Social connections:    Talks on phone: Not on file    Gets together: Not on file    Attends religious service: Not on file    Active member of club or organization: Not on file    Attends meetings of clubs or organizations: Not on file    Relationship status: Not on file  Other Topics Concern  . Not on file  Social History Narrative   single, no children   [facility] [place]    had a photography business in the past   One caffeinated beverage daily   05/28/2016   Review of Systems - See HPI.  All other ROS are negative.  BP 100/80   Pulse 67  Temp 97.9 F (36.6 C) (Oral)   Resp 14   Ht 5\' 5"  (1.651 m)   Wt 169 lb (76.7 kg)   SpO2 98%   Breastfeeding? Yes   BMI 28.12 kg/m   Physical Exam  Constitutional: She is oriented to person, place, and time. She appears well-developed and well-nourished.  HENT:  Head: Normocephalic and atraumatic.  Eyes: Pupils are equal, round, and reactive to light.  Cardiovascular: Normal rate, regular rhythm and normal heart sounds.  Pulmonary/Chest: Effort normal and breath sounds normal.  Abdominal: Normal appearance and normal aorta. Bowel sounds are increased. There is tenderness in the left upper quadrant.  Neurological: She is alert and oriented to person, place, and time.  Psychiatric: She has a normal mood and affect.  Vitals reviewed.  Recent Results (from the past 2160 hour(s))  Varicella zoster antibody, IgG     Status: None   Collection Time: 11/12/17  2:18 PM  Result Value Ref Range   Varicella IgG 1,288.00 index    Comment:        Index                Interpretation      ---------         ----------------------     <135.00            Negative - Antibody not detected     135.00 - 164.99    Equivocal     > or = 165.00      Positive - Antibody detected .     A positive result indicates that the patient     has antibody to VZV but does not differentiate     between an active or past infection.      The clinical diagnosis must be interpreted in      conjunction with the clinical signs and symptoms of      the patient. This assay reliably measures immunity     due to previous infection but may not be      sensitive enough to detect antibodies induced by     vaccination. Thus, a negative result in a vaccinated     individual does not necessarily indicate     susceptibility to VZV infection. A more sensitive     test for vaccination-induced immunity is Varicella     Zoster Virus Antibody Immunity Screen, ACIF.   Measles/Mumps/Rubella Immunity     Status: None   Collection Time: 11/12/17  2:18 PM  Result Value Ref Range   Rubeola IgG 68.40 AU/mL    Comment: AU/mL            Interpretation -----            -------------- <25.00           Negative 25.00-29.99      Equivocal >29.99           Positive . A positive result indicates that the patient has antibody to measles virus. It does not differentiate  between an active or past infection. The clinical  diagnosis must be interpreted in conjunction with  clinical signs and symptoms of the patient.    Mumps IgG 32.20 AU/mL    Comment:  AU/mL           Interpretation -------         ---------------- <9.00             Negative 9.00-10.99        Equivocal >  10.99            Positive A positive result indicates that the patient has  antibody to mumps virus. It does not differentiate between an  active or past infection. The clinical diagnosis must be interpreted in conjunction with clinical signs and symptoms of the patient. .    Rubella 2.54 index    Comment:     Index             Interpretation     -----            --------------       <0.90            Not consistent with Immunity     0.90-0.99        Equivocal     > or = 1.00      Consistent with Immunity  . The presence of rubella IgG antibody suggests  immunization or past or current infection with rubella virus.     Assessment/Plan: 1. Acute gastritis without hemorrhage, unspecified gastritis type Start Omeprazole 20 mg daily. Start GERD and BRAT diet. Supportive measures reviewed. Follow-up if not improving over the weekend.  - omeprazole (PRILOSEC) 20 MG capsule; Take 1 capsule (20 mg total) by mouth daily.  Dispense: 30 capsule; Refill: 0   Piedad Climes, PA-C

## 2017-12-24 MED FILL — NORETHINDRONE 0.35 MG TAB: 0.35 | 28 days supply | Qty: 28 | Fill #2

## 2018-01-07 DIAGNOSIS — H43811 Vitreous degeneration, right eye: Secondary | ICD-10-CM | POA: Diagnosis not present

## 2018-01-07 DIAGNOSIS — H52203 Unspecified astigmatism, bilateral: Secondary | ICD-10-CM | POA: Diagnosis not present

## 2018-01-19 MED FILL — NORETHINDRONE 0.35 MG TAB: 0.35 | 84 days supply | Qty: 84 | Fill #0

## 2018-04-18 MED FILL — NORETHINDRONE 0.35 MG TAB: 0.35 | 84 days supply | Qty: 84 | Fill #1

## 2018-07-11 MED FILL — NORETHINDRONE 0.35 MG TAB: 0.35 | 84 days supply | Qty: 84 | Fill #0

## 2018-08-02 MED FILL — PROCTOZONE-HC 2.5 % CREA: 2.5 | 10 days supply | Qty: 30 | Fill #0

## 2018-08-02 MED FILL — TRI-LO-SPRINTEC TABLET: 0.18/0.215/ | 84 days supply | Qty: 84 | Fill #0

## 2018-08-31 ENCOUNTER — Other Ambulatory Visit: Payer: Self-pay

## 2018-08-31 ENCOUNTER — Ambulatory Visit (INDEPENDENT_AMBULATORY_CARE_PROVIDER_SITE_OTHER): Payer: No Typology Code available for payment source | Admitting: Physician Assistant

## 2018-08-31 ENCOUNTER — Encounter: Payer: Self-pay | Admitting: Physician Assistant

## 2018-08-31 VITALS — BP 100/70 | HR 84 | Temp 99.4°F | Resp 16 | Ht 66.0 in | Wt 168.0 lb

## 2018-08-31 DIAGNOSIS — Z0001 Encounter for general adult medical examination with abnormal findings: Secondary | ICD-10-CM | POA: Diagnosis not present

## 2018-08-31 DIAGNOSIS — H9202 Otalgia, left ear: Secondary | ICD-10-CM | POA: Diagnosis not present

## 2018-08-31 DIAGNOSIS — E010 Iodine-deficiency related diffuse (endemic) goiter: Secondary | ICD-10-CM

## 2018-08-31 DIAGNOSIS — G8929 Other chronic pain: Secondary | ICD-10-CM | POA: Diagnosis not present

## 2018-08-31 DIAGNOSIS — Z0000 Encounter for general adult medical examination without abnormal findings: Secondary | ICD-10-CM

## 2018-08-31 LAB — CBC WITH DIFFERENTIAL/PLATELET
Basophils Absolute: 0 10*3/uL (ref 0.0–0.1)
Basophils Relative: 0.4 % (ref 0.0–3.0)
Eosinophils Absolute: 0 10*3/uL (ref 0.0–0.7)
Eosinophils Relative: 0.3 % (ref 0.0–5.0)
HCT: 40.9 % (ref 36.0–46.0)
Hemoglobin: 13.7 g/dL (ref 12.0–15.0)
Lymphocytes Relative: 44.2 % (ref 12.0–46.0)
Lymphs Abs: 2.6 10*3/uL (ref 0.7–4.0)
MCHC: 33.4 g/dL (ref 30.0–36.0)
MCV: 95.3 fl (ref 78.0–100.0)
Monocytes Absolute: 0.3 10*3/uL (ref 0.1–1.0)
Monocytes Relative: 5.8 % (ref 3.0–12.0)
Neutro Abs: 2.9 10*3/uL (ref 1.4–7.7)
Neutrophils Relative %: 49.3 % (ref 43.0–77.0)
Platelets: 363 10*3/uL (ref 150.0–400.0)
RBC: 4.29 Mil/uL (ref 3.87–5.11)
RDW: 12.6 % (ref 11.5–15.5)
WBC: 5.9 10*3/uL (ref 4.0–10.5)

## 2018-08-31 LAB — T4, FREE: Free T4: 0.87 ng/dL (ref 0.60–1.60)

## 2018-08-31 LAB — COMPREHENSIVE METABOLIC PANEL
ALT: 8 U/L (ref 0–35)
AST: 13 U/L (ref 0–37)
Albumin: 4.2 g/dL (ref 3.5–5.2)
Alkaline Phosphatase: 48 U/L (ref 39–117)
BUN: 8 mg/dL (ref 6–23)
CO2: 27 mEq/L (ref 19–32)
Calcium: 9 mg/dL (ref 8.4–10.5)
Chloride: 105 mEq/L (ref 96–112)
Creatinine, Ser: 0.72 mg/dL (ref 0.40–1.20)
GFR: 98.87 mL/min (ref >60.00)
Glucose, Bld: 82 mg/dL (ref 70–99)
Potassium: 4.6 mEq/L (ref 3.5–5.1)
Sodium: 138 mEq/L (ref 135–145)
Total Bilirubin: 0.4 mg/dL (ref 0.2–1.2)
Total Protein: 6.8 g/dL (ref 6.0–8.3)

## 2018-08-31 LAB — HEMOGLOBIN A1C: Hgb A1c MFr Bld: 5.2 % (ref 4.6–6.5)

## 2018-08-31 LAB — LIPID PANEL
Cholesterol: 160 mg/dL (ref 0–200)
HDL: 41.9 mg/dL (ref >39.00)
LDL Cholesterol: 96 mg/dL (ref 0–99)
NonHDL: 117.8
Total CHOL/HDL Ratio: 4
Triglycerides: 109 mg/dL (ref 0.0–149.0)
VLDL: 21.8 mg/dL (ref 0.0–40.0)

## 2018-08-31 LAB — TSH: TSH: 0.63 u[IU]/mL (ref 0.35–4.50)

## 2018-08-31 NOTE — Patient Instructions (Signed)
Please go to the lab for blood work.   Our office will call you with your results unless you have chosen to receive results via MyChart.  If your blood work is normal we will follow-up each year for physicals and as scheduled for chronic medical problems.  If anything is abnormal we will treat accordingly and get you in for a follow-up.  You will be contacted to schedule a thyroid US. You will also be contacted for assessment by ENT.   Preventive Care 25-25 Years Old, Female Preventive care refers to visits with your health care provider and lifestyle choices that can promote health and wellness. This includes:  A yearly physical exam. This may also be called an annual well check.  Regular dental visits and eye exams.  Immunizations.  Screening for certain conditions.  Healthy lifestyle choices, such as eating a healthy diet, getting regular exercise, not using drugs or products that contain nicotine and tobacco, and limiting alcohol use. What can I expect for my preventive care visit? Physical exam Your health care provider will check your:  Height and weight. This may be used to calculate body mass index (BMI), which tells if you are at a healthy weight.  Heart rate and blood pressure.  Skin for abnormal spots. Counseling Your health care provider may ask you questions about your:  Alcohol, tobacco, and drug use.  Emotional well-being.  Home and relationship well-being.  Sexual activity.  Eating habits.  Work and work Statistician.  Method of birth control.  Menstrual cycle.  Pregnancy history. What immunizations do I need?  Influenza (flu) vaccine  This is recommended every year. Tetanus, diphtheria, and pertussis (Tdap) vaccine  You may need a Td booster every 10 years. Varicella (chickenpox) vaccine  You may need this if you have not been vaccinated. Human papillomavirus (HPV) vaccine  If recommended by your health care provider, you may need  three doses over 6 months. Measles, mumps, and rubella (MMR) vaccine  You may need at least one dose of MMR. You may also need a second dose. Meningococcal conjugate (MenACWY) vaccine  One dose is recommended if you are age 47-21 years and a first-year college student living in a residence hall, or if you have one of several medical conditions. You may also need additional booster doses. Pneumococcal conjugate (PCV13) vaccine  You may need this if you have certain conditions and were not previously vaccinated. Pneumococcal polysaccharide (PPSV23) vaccine  You may need one or two doses if you smoke cigarettes or if you have certain conditions. Hepatitis A vaccine  You may need this if you have certain conditions or if you travel or work in places where you may be exposed to hepatitis A. Hepatitis B vaccine  You may need this if you have certain conditions or if you travel or work in places where you may be exposed to hepatitis B. Haemophilus influenzae type b (Hib) vaccine  You may need this if you have certain conditions. You may receive vaccines as individual doses or as more than one vaccine together in one shot (combination vaccines). Talk with your health care provider about the risks and benefits of combination vaccines. What tests do I need?  Blood tests  Lipid and cholesterol levels. These may be checked every 5 years starting at age 68.  Hepatitis C test.  Hepatitis B test. Screening  Diabetes screening. This is done by checking your blood sugar (glucose) after you have not eaten for a while (fasting).  Sexually  transmitted disease (STD) testing.  BRCA-related cancer screening. This may be done if you have a family history of breast, ovarian, tubal, or peritoneal cancers.  Pelvic exam and Pap test. This may be done every 3 years starting at age 48. Starting at age 25, this may be done every 5 years if you have a Pap test in combination with an HPV test. Talk with your  health care provider about your test results, treatment options, and if necessary, the need for more tests. Follow these instructions at home: Eating and drinking   Eat a diet that includes fresh fruits and vegetables, whole grains, lean protein, and low-fat dairy.  Take vitamin and mineral supplements as recommended by your health care provider.  Do not drink alcohol if: ? Your health care provider tells you not to drink. ? You are pregnant, may be pregnant, or are planning to become pregnant.  If you drink alcohol: ? Limit how much you have to 0-1 drink a day. ? Be aware of how much alcohol is in your drink. In the [place] one drink equals one 12 oz bottle of beer (355 mL), one 5 oz glass of wine (148 mL), or one 1 oz glass of hard liquor (44 mL). Lifestyle  Take daily care of your teeth and gums.  Stay active. Exercise for at least 30 minutes on 5 or more days each week.  Do not use any products that contain nicotine or tobacco, such as cigarettes, e-cigarettes, and chewing tobacco. If you need help quitting, ask your health care provider.  If you are sexually active, practice safe sex. Use a condom or other form of birth control (contraception) in order to prevent pregnancy and STIs (sexually transmitted infections). If you plan to become pregnant, see your health care provider for a preconception visit. What's next?  Visit your health care provider once a year for a well check visit.  Ask your health care provider how often you should have your eyes and teeth checked.  Stay up to date on all vaccines. This information is not intended to replace advice given to you by your health care provider. Make sure you discuss any questions you have with your health care provider. Document Released: 04/14/2001 Document Revised: 10/28/2017 Document Reviewed: 10/28/2017 Elsevier Patient Education  2020 [employer].

## 2018-08-31 NOTE — Progress Notes (Signed)
Patient presents to clinic today for annual exam.  Patient is fasting for labs.  Acute Concerns: Patient endorses intermittent pain of L ear over the past 5-6 months. Endorses pain is shooting when occurs. Feels inside the ear but not a "deep" pain. Notes some slight decrease in hearing. Denies pressure or fullness of the ear. Denies nasal congestion, sinus pressure or other URI symptoms. Denies trauma to the ear. Some very slight, occasional tinnitus of L ear only. Denies symptoms of R ear.  Patient endorses mother with history of hashimoto's. Patient notes feeling a fullness in the neck. Denies neck pain or dysphagia. Does note fatigue but is very busy currently so hard to tell if this is just from schedule. Denies palpitations. But notes some constipation -- prior diagnosis of IBS-C from GI. Has medication but not taking. Endorses difficulty losing weight despite keeping very active ([employer], etc a few times per week), significant change in diet with only 1-2 pound weight loss over a 7210-month period.  Health Maintenance: Immunizations -- up-to-date PAP -- Sees GYN (Dr. Billy Coastaavon). Notes recent PAP -- will get records.   Past Medical History:  Diagnosis Date  . Chronic constipation   . Chronic posterior anal fissure   . External hemorrhoids   . Family history of early CAD   . Family history of headaches   . GERD (gastroesophageal reflux disease)   . Hirsutism   . History of chicken pox   . Hypotension   . Palpitations   . PCOS (polycystic ovarian syndrome)     Past Surgical History:  Procedure Laterality Date  . CESAREAN SECTION N/A 06/01/2017   Procedure: CESAREAN SECTION;  Surgeon: Olivia Mackieaavon, Richard, MD;  [facility];  Service: Obstetrics;  Laterality: N/A;  . TONSILLECTOMY AND ADENOIDECTOMY      Current Outpatient Medications on File Prior to Visit  Medication Sig Dispense Refill  . linaclotide (LINZESS) 290 MCG CAPS capsule Take 290 mcg by mouth daily before  breakfast.    . omeprazole (PRILOSEC) 20 MG capsule Take 1 capsule (20 mg total) by mouth daily. 30 capsule 0  . PROCTOZONE-HC 2.5 % rectal cream     . TRI-LO-SPRINTEC 0.18/0.215/0.25 MG-25 MCG tab Take 1 tablet by mouth daily.     No current facility-administered medications on file prior to visit.     No Known Allergies  Family History  Problem Relation Age of Onset  . Hyperlipidemia Mother   . Diabetes Mother   . Cancer Mother 730       Uterus,Skin,Thyroid  . Heart attack Mother 340  . Irritable bowel syndrome Mother   . COPD Mother   . Heart attack Father   . Cancer Maternal Aunt 30       Breast  . Diabetes Maternal Grandfather     Social History   Socioeconomic History  . Marital status: Married    Spouse name: Not on file  . Number of children: Not on file  . Years of education: Not on file  . Highest education level: Not on file  Occupational History  . Not on file  Social Needs  . Financial resource strain: Not on file  . Food insecurity    Worry: Not on file    Inability: Not on file  . Transportation needs    Medical: Not on file    Non-medical: Not on file  Tobacco Use  . Smoking status: Never Smoker  . Smokeless tobacco: Never Used  Substance and Sexual  Activity  . Alcohol use: Yes    Alcohol/week: 0.0 standard drinks    Comment: occasional  . Drug use: No  . Sexual activity: Yes    Partners: Male    Birth control/protection: Pill  Lifestyle  . Physical activity    Days per week: Not on file    Minutes per session: Not on file  . Stress: Not on file  Relationships  . Social Herbalist on phone: Not on file    Gets together: Not on file    Attends religious service: Not on file    Active member of club or organization: Not on file    Attends meetings of clubs or organizations: Not on file    Relationship status: Not on file  . Intimate partner violence    Fear of current or ex partner: Not on file    Emotionally abused: Not on  file    Physically abused: Not on file    Forced sexual activity: Not on file  Other Topics Concern  . Not on file  Social History Narrative   single, no children   [facility] [place]    had a photography business in the past   One caffeinated beverage daily   05/28/2016   Review of Systems  Constitutional: Positive for malaise/fatigue. Negative for fever and weight loss.  HENT: Positive for ear pain, hearing loss and tinnitus. Negative for congestion, ear discharge, sinus pain and sore throat.   Eyes: Negative for blurred vision, double vision, photophobia and pain.  Respiratory: Negative for cough and shortness of breath.   Cardiovascular: Negative for chest pain and palpitations.  Gastrointestinal: Negative for abdominal pain, blood in stool, constipation, diarrhea, heartburn, melena, nausea and vomiting.  Genitourinary: Negative for dysuria, flank pain, frequency, hematuria and urgency.  Musculoskeletal: Negative for falls.  Neurological: Negative for dizziness, loss of consciousness and headaches.  Endo/Heme/Allergies: Negative for environmental allergies.  Psychiatric/Behavioral: Negative for depression, hallucinations, substance abuse and suicidal ideas. The patient is nervous/anxious (slight anxiety but managing well). The patient does not have insomnia.    BP 100/70   Pulse 84   Temp 99.4 F (37.4 C) (Skin)   Resp 16   Ht 5\' 6"  (1.676 m)   Wt 168 lb (76.2 kg)   SpO2 98%   Breastfeeding No   BMI 27.12 kg/m   Physical Exam Vitals signs reviewed.  HENT:     Head: Normocephalic and atraumatic.     Right Ear: Tympanic membrane, ear canal and external ear normal.     Left Ear: Tympanic membrane, ear canal and external ear normal.     Nose: Nose normal. No mucosal edema.     Mouth/Throat:     Pharynx: Uvula midline. No oropharyngeal exudate or posterior oropharyngeal erythema.  Eyes:     Conjunctiva/sclera: Conjunctivae normal.     Pupils: Pupils are equal,  round, and reactive to light.  Neck:     Musculoskeletal: Neck supple.     Thyroid: Thyromegaly (diffuse) present. No thyroid mass or thyroid tenderness.  Cardiovascular:     Rate and Rhythm: Normal rate and regular rhythm.     Heart sounds: Normal heart sounds.  Pulmonary:     Effort: Pulmonary effort is normal. No respiratory distress.     Breath sounds: Normal breath sounds. No wheezing or rales.  Abdominal:     General: Bowel sounds are normal. There is no distension.     Palpations: Abdomen is  soft. There is no mass.     Tenderness: There is no abdominal tenderness. There is no guarding or rebound.  Lymphadenopathy:     Cervical: No cervical adenopathy.  Skin:    General: Skin is warm and dry.     Findings: No rash.  Neurological:     Mental Status: She is alert and oriented to person, place, and time.     Cranial Nerves: No cranial nerve deficit.    Assessment/Plan: 1. Visit for preventive health examination Depression screen negative. Health Maintenance reviewed. Preventive schedule discussed and handout given in AVS. Will obtain fasting labs today.  - CBC with Differential/Platelet - Comprehensive metabolic panel - Hemoglobin A1c - Lipid panel  2. Thyromegaly Will check lab panel today and obtain thyroid US to further assess. - TSH - Thyroid peroxidase antibody - T4, free - US Soft Tissue Head/Neck; Future  3. Chronic left ear pain Exam today unremarkable for cause of symptoms. Giving chronicity and severity will have her assessed by ENT. - Ambulatory referral to ENT   Piedad ClimesWilliam Cody Denece Shearer, PA-C

## 2018-09-01 LAB — THYROID PEROXIDASE ANTIBODY: Thyroperoxidase Ab SerPl-aCnc: <1 IU/mL (ref <9)

## 2018-09-13 ENCOUNTER — Encounter: Payer: Self-pay | Admitting: Physician Assistant

## 2018-09-13 DIAGNOSIS — E010 Iodine-deficiency related diffuse (endemic) goiter: Secondary | ICD-10-CM

## 2018-09-14 NOTE — Telephone Encounter (Signed)
[name],   Please see MyChart message. I had ordered an [place] on the 1st which the patient has not been contacted regarding. I did change the order today to a thyroid US as the original order was for thyroid US but when order went through it is showing an US neck (non-thyroid) - which I have canceled. If you could help the patient get scheduled I would greatly appreciate it.

## 2018-09-19 ENCOUNTER — Ambulatory Visit
Admission: RE | Admit: 2018-09-19 | Discharge: 2018-09-19 | Disposition: A | Discharge status: Home / Self Care | Payer: No Typology Code available for payment source | Source: Ambulatory Visit | Attending: Physician Assistant | Admitting: Physician Assistant

## 2018-09-19 DIAGNOSIS — E010 Iodine-deficiency related diffuse (endemic) goiter: Secondary | ICD-10-CM

## 2018-09-20 ENCOUNTER — Other Ambulatory Visit: Payer: Self-pay | Admitting: Emergency Medicine

## 2018-09-20 DIAGNOSIS — R0989 Other specified symptoms and signs involving the circulatory and respiratory systems: Secondary | ICD-10-CM

## 2018-09-20 DIAGNOSIS — E010 Iodine-deficiency related diffuse (endemic) goiter: Secondary | ICD-10-CM

## 2018-10-20 ENCOUNTER — Ambulatory Visit (INDEPENDENT_AMBULATORY_CARE_PROVIDER_SITE_OTHER): Payer: No Typology Code available for payment source | Admitting: Otolaryngology

## 2018-10-20 DIAGNOSIS — R490 Dysphonia: Secondary | ICD-10-CM

## 2018-10-20 DIAGNOSIS — K219 Gastro-esophageal reflux disease without esophagitis: Secondary | ICD-10-CM

## 2018-10-20 MED FILL — FAMOTIDINE 20 MG TABLET: 20 | 30 days supply | Qty: 30 | Fill #0

## 2018-11-03 MED FILL — TRI-LO-SPRINTEC TABLET: 0.18/0.215/ | 84 days supply | Qty: 84 | Fill #1

## 2019-01-13 MED FILL — HYDROCODON-APAP 5-325: 5-325 | 4 days supply | Qty: 20 | Fill #0

## 2019-01-13 MED FILL — CHLORHEXIDINE 0.12% RINSE: 0.12 | 16 days supply | Qty: 473 | Fill #0

## 2019-02-21 LAB — OB RESULTS CONSOLE HEPATITIS B SURFACE ANTIGEN: Hepatitis B Surface Ag: NEGATIVE

## 2019-02-21 LAB — OB RESULTS CONSOLE ABO/RH: RH Type: POSITIVE

## 2019-02-21 LAB — OB RESULTS CONSOLE GC/CHLAMYDIA
Chlamydia: NEGATIVE
Gonorrhea: NEGATIVE

## 2019-02-21 LAB — OB RESULTS CONSOLE RUBELLA ANTIBODY, IGM: Rubella: IMMUNE

## 2019-02-21 LAB — OB RESULTS CONSOLE ANTIBODY SCREEN: Antibody Screen: NEGATIVE

## 2019-02-21 LAB — OB RESULTS CONSOLE HIV ANTIBODY (ROUTINE TESTING): HIV: NONREACTIVE

## 2019-02-21 LAB — OB RESULTS CONSOLE RPR: RPR: NONREACTIVE

## 2019-03-11 ENCOUNTER — Encounter (HOSPITAL_BASED_OUTPATIENT_CLINIC_OR_DEPARTMENT_OTHER): Payer: Self-pay

## 2019-03-11 ENCOUNTER — Other Ambulatory Visit: Payer: Self-pay

## 2019-03-11 ENCOUNTER — Emergency Department (HOSPITAL_BASED_OUTPATIENT_CLINIC_OR_DEPARTMENT_OTHER): Payer: 59

## 2019-03-11 ENCOUNTER — Inpatient Hospital Stay (EMERGENCY_DEPARTMENT_HOSPITAL)
Admission: AD | Admit: 2019-03-11 | Discharge: 2019-03-11 | Disposition: A | Discharge status: Home / Self Care | Payer: 59 | Source: Home / Self Care | Attending: Obstetrics and Gynecology | Admitting: Obstetrics and Gynecology

## 2019-03-11 ENCOUNTER — Emergency Department (HOSPITAL_BASED_OUTPATIENT_CLINIC_OR_DEPARTMENT_OTHER)
Admission: EM | Admit: 2019-03-11 | Discharge: 2019-03-11 | Disposition: A | Discharge status: Home / Self Care | Payer: 59 | Attending: Emergency Medicine | Admitting: Emergency Medicine

## 2019-03-11 ENCOUNTER — Encounter (HOSPITAL_COMMUNITY): Payer: Self-pay | Admitting: Obstetrics and Gynecology

## 2019-03-11 DIAGNOSIS — M25552 Pain in left hip: Secondary | ICD-10-CM | POA: Diagnosis not present

## 2019-03-11 DIAGNOSIS — Z79899 Other long term (current) drug therapy: Secondary | ICD-10-CM | POA: Diagnosis not present

## 2019-03-11 DIAGNOSIS — O9A211 Injury, poisoning and certain other consequences of external causes complicating pregnancy, first trimester: Secondary | ICD-10-CM

## 2019-03-11 DIAGNOSIS — Z3A11 11 weeks gestation of pregnancy: Secondary | ICD-10-CM | POA: Diagnosis not present

## 2019-03-11 DIAGNOSIS — Z3A12 12 weeks gestation of pregnancy: Secondary | ICD-10-CM | POA: Diagnosis not present

## 2019-03-11 DIAGNOSIS — O26891 Other specified pregnancy related conditions, first trimester: Secondary | ICD-10-CM | POA: Diagnosis not present

## 2019-03-11 DIAGNOSIS — R1084 Generalized abdominal pain: Secondary | ICD-10-CM | POA: Diagnosis not present

## 2019-03-11 DIAGNOSIS — I959 Hypotension, unspecified: Secondary | ICD-10-CM | POA: Insufficient documentation

## 2019-03-11 DIAGNOSIS — O99891 Other specified diseases and conditions complicating pregnancy: Secondary | ICD-10-CM | POA: Diagnosis not present

## 2019-03-11 LAB — COMPREHENSIVE METABOLIC PANEL
ALT: 13 U/L (ref 0–44)
AST: 17 U/L (ref 15–41)
Albumin: 4 g/dL (ref 3.5–5.0)
Alkaline Phosphatase: 49 U/L (ref 38–126)
Anion gap: 9 (ref 5–15)
BUN: 6 mg/dL (ref 6–20)
CO2: 22 mmol/L (ref 22–32)
Calcium: 9.2 mg/dL (ref 8.9–10.3)
Chloride: 104 mmol/L (ref 98–111)
Creatinine, Ser: 0.5 mg/dL (ref 0.44–1.00)
GFR calc Af Amer: >60 mL/min (ref >60)
GFR calc non Af Amer: >60 mL/min (ref >60)
Glucose, Bld: 98 mg/dL (ref 70–99)
Potassium: 3.7 mmol/L (ref 3.5–5.1)
Sodium: 135 mmol/L (ref 135–145)
Total Bilirubin: 0.6 mg/dL (ref 0.3–1.2)
Total Protein: 7.3 g/dL (ref 6.5–8.1)

## 2019-03-11 LAB — CBC WITH DIFFERENTIAL/PLATELET
Abs Immature Granulocytes: 0.14 10*3/uL — ABNORMAL HIGH (ref 0.00–0.07)
Basophils Absolute: 0.1 10*3/uL (ref 0.0–0.1)
Basophils Relative: 1 %
Eosinophils Absolute: 0 10*3/uL (ref 0.0–0.5)
Eosinophils Relative: 0 %
HCT: 37.2 % (ref 36.0–46.0)
Hemoglobin: 12.8 g/dL (ref 12.0–15.0)
Immature Granulocytes: 1 %
Lymphocytes Relative: 20 %
Lymphs Abs: 2.4 10*3/uL (ref 0.7–4.0)
MCH: 32.1 pg (ref 26.0–34.0)
MCHC: 34.4 g/dL (ref 30.0–36.0)
MCV: 93.2 fL (ref 80.0–100.0)
Monocytes Absolute: 0.7 10*3/uL (ref 0.1–1.0)
Monocytes Relative: 6 %
Neutro Abs: 8.9 10*3/uL — ABNORMAL HIGH (ref 1.7–7.7)
Neutrophils Relative %: 72 %
Platelets: 363 10*3/uL (ref 150–400)
RBC: 3.99 MIL/uL (ref 3.87–5.11)
RDW: 12.4 % (ref 11.5–15.5)
WBC: 12.2 10*3/uL — ABNORMAL HIGH (ref 4.0–10.5)
nRBC: 0 % (ref 0.0–0.2)

## 2019-03-11 LAB — URINALYSIS, ROUTINE W REFLEX MICROSCOPIC
Bacteria, UA: NONE SEEN
Bilirubin Urine: NEGATIVE
Glucose, UA: NEGATIVE mg/dL
Hgb urine dipstick: NEGATIVE
Ketones, ur: NEGATIVE mg/dL
Leukocytes,Ua: NEGATIVE
Nitrite: NEGATIVE
Protein, ur: 100 mg/dL — AB
Specific Gravity, Urine: 1.017 (ref 1.005–1.030)
pH: 8 (ref 5.0–8.0)

## 2019-03-11 LAB — LIPASE, BLOOD: Lipase: 23 U/L (ref 11–51)
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
## 2019-03-11 NOTE — ED Notes (Signed)
Patient transported to Ultrasound 

## 2019-03-11 NOTE — Discharge Instructions (Signed)
Your lab work and ultrasound are reassuring today, given that you are able to walk on the left hip we will hold off on any imaging or radiation at this time, use Tylenol, ice and heat, you can also use topical lidocaine patches or Biofreeze gel.  If symptoms are not improving or pain is worsening return to the ED.  If you have any vaginal bleeding or leakage of fluid please go to the MAU.

## 2019-03-11 NOTE — MAU Note (Signed)
Patient discussed transfer with her husband. At this time the patient declines transfer to the ED. [facility] ED charge nurse made aware as well as MAU provider Nugent, NP. Patient ambulatory and alert and oriented x4 upon departure from MAU.
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
## 2019-04-11 DIAGNOSIS — Z361 Encounter for antenatal screening for raised alphafetoprotein level: Secondary | ICD-10-CM | POA: Diagnosis not present

## 2019-04-27 DIAGNOSIS — R05 Cough: Secondary | ICD-10-CM | POA: Diagnosis not present

## 2019-04-27 DIAGNOSIS — R0989 Other specified symptoms and signs involving the circulatory and respiratory systems: Secondary | ICD-10-CM | POA: Diagnosis not present

## 2019-05-02 DIAGNOSIS — Z363 Encounter for antenatal screening for malformations: Secondary | ICD-10-CM | POA: Diagnosis not present

## 2019-05-16 DIAGNOSIS — Z362 Encounter for other antenatal screening follow-up: Secondary | ICD-10-CM | POA: Diagnosis not present

## 2019-06-13 DIAGNOSIS — O26892 Other specified pregnancy related conditions, second trimester: Secondary | ICD-10-CM | POA: Diagnosis not present

## 2019-06-13 DIAGNOSIS — Z3A25 25 weeks gestation of pregnancy: Secondary | ICD-10-CM | POA: Diagnosis not present

## 2019-07-04 DIAGNOSIS — Z3689 Encounter for other specified antenatal screening: Secondary | ICD-10-CM | POA: Diagnosis not present

## 2019-07-04 DIAGNOSIS — Z23 Encounter for immunization: Secondary | ICD-10-CM | POA: Diagnosis not present

## 2019-07-18 MED FILL — TERCONAZOLE 0.4% CREAM: 0.4 | 7 days supply | Qty: 45 | Fill #0

## 2019-08-29 ENCOUNTER — Other Ambulatory Visit: Payer: Self-pay | Admitting: Obstetrics and Gynecology

## 2019-08-30 DIAGNOSIS — N898 Other specified noninflammatory disorders of vagina: Secondary | ICD-10-CM | POA: Diagnosis not present

## 2019-08-30 DIAGNOSIS — Z3685 Encounter for antenatal screening for Streptococcus B: Secondary | ICD-10-CM | POA: Diagnosis not present

## 2019-09-01 LAB — OB RESULTS CONSOLE GBS: GBS: POSITIVE

## 2019-09-04 ENCOUNTER — Telehealth (HOSPITAL_COMMUNITY): Payer: Self-pay | Admitting: *Deleted

## 2019-09-04 NOTE — Telephone Encounter (Signed)
Preadmission screen  

## 2019-09-04 NOTE — Patient Instructions (Signed)
Shetara Launer  09/04/2019   Your procedure is scheduled on:  09/19/2019  Arrive at 1100 at Entrance C on [address] at [facility]  and [address]. You are invited to use the FREE valet parking or use the Visitor's parking deck.  Pick up the phone at the desk and dial [phone].  Call this number if you have problems the morning of surgery: [phone]  Remember:   Do not eat food:(After Midnight) Desps de medianoche.  Do not drink clear liquids: (After Midnight) Desps de medianoche.  Take these medicines the morning of surgery with A SIP OF WATER:  none   Do not wear jewelry, make-up or nail polish.  Do not wear lotions, powders, or perfumes. Do not wear deodorant.  Do not shave 48 hours prior to surgery.  Do not bring valuables to the hospital.  [facility] is not   responsible for any belongings or valuables brought to the hospital.  Contacts, dentures or bridgework may not be worn into surgery.  Leave suitcase in the car. After surgery it may be brought to your room.  For patients admitted to the hospital, checkout time is 11:00 AM the day of              discharge.      Please read over the following fact sheets that you were given:     Preparing for Surgery

## 2019-09-05 ENCOUNTER — Telehealth (HOSPITAL_COMMUNITY): Payer: Self-pay | Admitting: *Deleted

## 2019-09-05 NOTE — Telephone Encounter (Signed)
Preadmission screen  

## 2019-09-06 ENCOUNTER — Encounter (HOSPITAL_COMMUNITY): Payer: Self-pay

## 2019-09-08 DIAGNOSIS — O3663X0 Maternal care for excessive fetal growth, third trimester, not applicable or unspecified: Secondary | ICD-10-CM | POA: Diagnosis not present

## 2019-09-08 DIAGNOSIS — Z3A37 37 weeks gestation of pregnancy: Secondary | ICD-10-CM | POA: Diagnosis not present

## 2019-09-17 ENCOUNTER — Other Ambulatory Visit: Payer: Self-pay

## 2019-09-17 ENCOUNTER — Other Ambulatory Visit (HOSPITAL_COMMUNITY)
Admission: RE | Admit: 2019-09-17 | Discharge: 2019-09-17 | Disposition: A | Discharge status: Home / Self Care | Payer: 59 | Source: Ambulatory Visit | Attending: Obstetrics and Gynecology | Admitting: Obstetrics and Gynecology

## 2019-09-17 DIAGNOSIS — Z20822 Contact with and (suspected) exposure to covid-19: Secondary | ICD-10-CM | POA: Diagnosis not present

## 2019-09-17 DIAGNOSIS — O99824 Streptococcus B carrier state complicating childbirth: Secondary | ICD-10-CM | POA: Diagnosis not present

## 2019-09-17 DIAGNOSIS — O34211 Maternal care for low transverse scar from previous cesarean delivery: Secondary | ICD-10-CM | POA: Diagnosis not present

## 2019-09-17 DIAGNOSIS — O9081 Anemia of the puerperium: Secondary | ICD-10-CM | POA: Diagnosis not present

## 2019-09-17 DIAGNOSIS — Z3A39 39 weeks gestation of pregnancy: Secondary | ICD-10-CM | POA: Diagnosis not present

## 2019-09-17 DIAGNOSIS — D62 Acute posthemorrhagic anemia: Secondary | ICD-10-CM | POA: Diagnosis not present

## 2019-09-17 DIAGNOSIS — O99214 Obesity complicating childbirth: Secondary | ICD-10-CM | POA: Diagnosis not present

## 2019-09-17 DIAGNOSIS — E669 Obesity, unspecified: Secondary | ICD-10-CM | POA: Diagnosis not present

## 2019-09-17 LAB — CBC
HCT: 30.8 % — ABNORMAL LOW (ref 36.0–46.0)
Hemoglobin: 9.8 g/dL — ABNORMAL LOW (ref 12.0–15.0)
MCH: 29.6 pg (ref 26.0–34.0)
MCHC: 31.8 g/dL (ref 30.0–36.0)
MCV: 93.1 fL (ref 80.0–100.0)
Platelets: 305 10*3/uL (ref 150–400)
RBC: 3.31 MIL/uL — ABNORMAL LOW (ref 3.87–5.11)
RDW: 12.9 % (ref 11.5–15.5)
WBC: 9.5 10*3/uL (ref 4.0–10.5)
nRBC: 0 % (ref 0.0–0.2)

## 2019-09-17 LAB — TYPE AND SCREEN
ABO/RH(D): A POS
Antibody Screen: NEGATIVE

## 2019-09-17 LAB — SARS CORONAVIRUS 2 (TAT 6-24 HRS): SARS Coronavirus 2: NEGATIVE

## 2019-09-17 LAB — RPR: RPR Ser Ql: NONREACTIVE

## 2019-09-17 NOTE — MAU Note (Signed)
Pt here for labs and covid swab. Denies symptoms and sick contacts. Swab collected. 

## 2019-09-19 ENCOUNTER — Encounter (HOSPITAL_COMMUNITY)
Admission: RE | Disposition: A | Discharge status: Home / Self Care | Payer: Self-pay | Source: Home / Self Care | Attending: Obstetrics and Gynecology

## 2019-09-19 ENCOUNTER — Inpatient Hospital Stay (HOSPITAL_COMMUNITY)
Admission: RE | Admit: 2019-09-19 | Discharge: 2019-09-21 | DRG: 787 | Disposition: A | Discharge status: Home / Self Care | Payer: 59 | Attending: Obstetrics and Gynecology | Admitting: Obstetrics and Gynecology

## 2019-09-19 ENCOUNTER — Encounter (HOSPITAL_COMMUNITY): Payer: Self-pay | Admitting: Obstetrics and Gynecology

## 2019-09-19 ENCOUNTER — Inpatient Hospital Stay (HOSPITAL_COMMUNITY): Payer: 59 | Admitting: Anesthesiology

## 2019-09-19 DIAGNOSIS — O34219 Maternal care for unspecified type scar from previous cesarean delivery: Secondary | ICD-10-CM | POA: Diagnosis not present

## 2019-09-19 DIAGNOSIS — O34211 Maternal care for low transverse scar from previous cesarean delivery: Principal | ICD-10-CM | POA: Diagnosis present

## 2019-09-19 DIAGNOSIS — D62 Acute posthemorrhagic anemia: Secondary | ICD-10-CM | POA: Diagnosis not present

## 2019-09-19 DIAGNOSIS — Z3A00 Weeks of gestation of pregnancy not specified: Secondary | ICD-10-CM | POA: Diagnosis not present

## 2019-09-19 DIAGNOSIS — E669 Obesity, unspecified: Secondary | ICD-10-CM | POA: Diagnosis present

## 2019-09-19 DIAGNOSIS — O99824 Streptococcus B carrier state complicating childbirth: Secondary | ICD-10-CM | POA: Diagnosis present

## 2019-09-19 DIAGNOSIS — Z3A39 39 weeks gestation of pregnancy: Secondary | ICD-10-CM | POA: Diagnosis not present

## 2019-09-19 DIAGNOSIS — O99214 Obesity complicating childbirth: Secondary | ICD-10-CM | POA: Diagnosis present

## 2019-09-19 DIAGNOSIS — Z20822 Contact with and (suspected) exposure to covid-19: Secondary | ICD-10-CM | POA: Diagnosis present

## 2019-09-19 DIAGNOSIS — O9081 Anemia of the puerperium: Secondary | ICD-10-CM | POA: Diagnosis not present

## 2019-09-19 DIAGNOSIS — Z98891 History of uterine scar from previous surgery: Secondary | ICD-10-CM

## 2019-09-19 SURGERY — Surgical Case
Anesthesia: Spinal | Wound class: Clean Contaminated

## 2019-09-19 MED ORDER — FENTANYL CITRATE (PF) 100 MCG/2ML IJ SOLN
INTRAMUSCULAR | Status: AC
  Filled 2019-09-19: qty 2

## 2019-09-19 MED ORDER — ONDANSETRON HCL 4 MG/2ML IJ SOLN
INTRAMUSCULAR | Status: DC | PRN
  Administered 2019-09-19: 4 mg via INTRAVENOUS

## 2019-09-19 MED ORDER — BUPIVACAINE HCL (PF) 0.25 % IJ SOLN
INTRAMUSCULAR | Status: AC
  Filled 2019-09-19: qty 20

## 2019-09-19 MED ORDER — NALOXONE HCL 4 MG/10ML IJ SOLN
1.0000 ug/kg/h | INTRAVENOUS | Status: DC | PRN
  Filled 2019-09-19: qty 5

## 2019-09-19 MED ORDER — LACTATED RINGERS IV SOLN: INTRAVENOUS | Status: DC

## 2019-09-19 MED ORDER — NALBUPHINE HCL 10 MG/ML IJ SOLN: 5.0000 mg | Freq: Once | INTRAMUSCULAR | Status: DC | PRN

## 2019-09-19 MED ORDER — DEXAMETHASONE SODIUM PHOSPHATE 4 MG/ML IJ SOLN
INTRAMUSCULAR | Status: AC
  Filled 2019-09-19: qty 1

## 2019-09-19 MED ORDER — DIPHENHYDRAMINE HCL 25 MG PO CAPS: 25.0000 mg | ORAL_CAPSULE | Freq: Four times a day (QID) | ORAL | Status: DC | PRN

## 2019-09-19 MED ORDER — PRENATAL MULTIVITAMIN CH
1.0000 | ORAL_TABLET | Freq: Every day | ORAL | Status: DC
  Administered 2019-09-20 – 2019-09-21 (×2): 1 via ORAL
  Filled 2019-09-19 (×2): qty 1

## 2019-09-19 MED ORDER — TETANUS-DIPHTH-ACELL PERTUSSIS 5-2.5-18.5 LF-MCG/0.5 IM SUSP: 0.5000 mL | Freq: Once | INTRAMUSCULAR | Status: DC

## 2019-09-19 MED ORDER — SENNOSIDES-DOCUSATE SODIUM 8.6-50 MG PO TABS
2.0000 | ORAL_TABLET | ORAL | Status: DC
  Administered 2019-09-19 – 2019-09-20 (×2): 2 via ORAL
  Filled 2019-09-19 (×2): qty 2

## 2019-09-19 MED ORDER — OXYCODONE-ACETAMINOPHEN 5-325 MG PO TABS: 1.0000 | ORAL_TABLET | ORAL | Status: DC | PRN

## 2019-09-19 MED ORDER — WITCH HAZEL-GLYCERIN EX PADS: 1.0000 "application " | MEDICATED_PAD | CUTANEOUS | Status: DC | PRN

## 2019-09-19 MED ORDER — SIMETHICONE 80 MG PO CHEW
80.0000 mg | CHEWABLE_TABLET | ORAL | Status: DC
  Administered 2019-09-19 – 2019-09-20 (×2): 80 mg via ORAL
  Filled 2019-09-19 (×2): qty 1

## 2019-09-19 MED ORDER — OXYTOCIN-SODIUM CHLORIDE 30-0.9 UT/500ML-% IV SOLN: 2.5000 [IU]/h | INTRAVENOUS | Status: AC

## 2019-09-19 MED ORDER — SCOPOLAMINE 1 MG/3DAYS TD PT72
MEDICATED_PATCH | TRANSDERMAL | Status: AC
  Filled 2019-09-19: qty 1

## 2019-09-19 MED ORDER — BUPIVACAINE HCL (PF) 0.25 % IJ SOLN
INTRAMUSCULAR | Status: DC | PRN
  Administered 2019-09-19: 20 mL

## 2019-09-19 MED ORDER — ONDANSETRON HCL 4 MG/2ML IJ SOLN
INTRAMUSCULAR | Status: AC
  Filled 2019-09-19: qty 2

## 2019-09-19 MED ORDER — CEFAZOLIN SODIUM-DEXTROSE 2-4 GM/100ML-% IV SOLN
2.0000 g | INTRAVENOUS | Status: AC
  Administered 2019-09-19: 2 g via INTRAVENOUS

## 2019-09-19 MED ORDER — OXYTOCIN-SODIUM CHLORIDE 30-0.9 UT/500ML-% IV SOLN
INTRAVENOUS | Status: AC
  Filled 2019-09-19: qty 500

## 2019-09-19 MED ORDER — POVIDONE-IODINE 10 % EX SWAB
2.0000 "application " | Freq: Once | CUTANEOUS | Status: AC
  Administered 2019-09-19: 2 via TOPICAL

## 2019-09-19 MED ORDER — IBUPROFEN 800 MG PO TABS
800.0000 mg | ORAL_TABLET | Freq: Four times a day (QID) | ORAL | Status: DC
  Administered 2019-09-20 – 2019-09-21 (×4): 800 mg via ORAL
  Filled 2019-09-19 (×4): qty 1

## 2019-09-19 MED ORDER — SIMETHICONE 80 MG PO CHEW: 80.0000 mg | CHEWABLE_TABLET | ORAL | Status: DC | PRN

## 2019-09-19 MED ORDER — NALOXONE HCL 0.4 MG/ML IJ SOLN: 0.4000 mg | INTRAMUSCULAR | Status: DC | PRN

## 2019-09-19 MED ORDER — FENTANYL CITRATE (PF) 100 MCG/2ML IJ SOLN
INTRAMUSCULAR | Status: DC | PRN
  Administered 2019-09-19: 15 ug via INTRAVENOUS

## 2019-09-19 MED ORDER — KETOROLAC TROMETHAMINE 30 MG/ML IJ SOLN
30.0000 mg | Freq: Four times a day (QID) | INTRAMUSCULAR | Status: AC
  Administered 2019-09-19 – 2019-09-20 (×3): 30 mg via INTRAVENOUS
  Filled 2019-09-19 (×3): qty 1

## 2019-09-19 MED ORDER — SODIUM CHLORIDE 0.9% FLUSH: 3.0000 mL | INTRAVENOUS | Status: DC | PRN

## 2019-09-19 MED ORDER — COCONUT OIL OIL: 1.0000 "application " | TOPICAL_OIL | Status: DC | PRN

## 2019-09-19 MED ORDER — DIPHENHYDRAMINE HCL 50 MG/ML IJ SOLN: 12.5000 mg | INTRAMUSCULAR | Status: DC | PRN

## 2019-09-19 MED ORDER — MENTHOL 3 MG MT LOZG: 1.0000 | LOZENGE | OROMUCOSAL | Status: DC | PRN

## 2019-09-19 MED ORDER — DIPHENHYDRAMINE HCL 25 MG PO CAPS: 25.0000 mg | ORAL_CAPSULE | ORAL | Status: DC | PRN

## 2019-09-19 MED ORDER — MORPHINE SULFATE (PF) 0.5 MG/ML IJ SOLN
INTRAMUSCULAR | Status: DC | PRN
  Administered 2019-09-19: .15 mg via EPIDURAL

## 2019-09-19 MED ORDER — KETOROLAC TROMETHAMINE 30 MG/ML IJ SOLN
30.0000 mg | Freq: Once | INTRAMUSCULAR | Status: AC | PRN
  Administered 2019-09-19: 30 mg via INTRAVENOUS

## 2019-09-19 MED ORDER — SCOPOLAMINE 1 MG/3DAYS TD PT72
1.0000 | MEDICATED_PATCH | Freq: Once | TRANSDERMAL | Status: DC
  Administered 2019-09-19: 1.5 mg via TRANSDERMAL

## 2019-09-19 MED ORDER — PHENYLEPHRINE HCL-NACL 20-0.9 MG/250ML-% IV SOLN
INTRAVENOUS | Status: DC | PRN
  Administered 2019-09-19: 60 ug/min via INTRAVENOUS

## 2019-09-19 MED ORDER — OXYTOCIN-SODIUM CHLORIDE 30-0.9 UT/500ML-% IV SOLN
INTRAVENOUS | Status: DC | PRN
Start: 2019-09-19 — End: 2019-09-19
  Administered 2019-09-19: 300 mL via INTRAVENOUS

## 2019-09-19 MED ORDER — MEPERIDINE HCL 25 MG/ML IJ SOLN: 6.2500 mg | INTRAMUSCULAR | Status: DC | PRN

## 2019-09-19 MED ORDER — DEXAMETHASONE SODIUM PHOSPHATE 4 MG/ML IJ SOLN
INTRAMUSCULAR | Status: DC | PRN
  Administered 2019-09-19: 4 mg via INTRAVENOUS

## 2019-09-19 MED ORDER — DIBUCAINE (PERIANAL) 1 % EX OINT: 1.0000 "application " | TOPICAL_OINTMENT | CUTANEOUS | Status: DC | PRN

## 2019-09-19 MED ORDER — METHYLERGONOVINE MALEATE 0.2 MG/ML IJ SOLN: 0.2000 mg | INTRAMUSCULAR | Status: DC | PRN

## 2019-09-19 MED ORDER — MORPHINE SULFATE (PF) 0.5 MG/ML IJ SOLN
INTRAMUSCULAR | Status: AC
  Filled 2019-09-19: qty 10

## 2019-09-19 MED ORDER — ONDANSETRON HCL 4 MG/2ML IJ SOLN: 4.0000 mg | Freq: Three times a day (TID) | INTRAMUSCULAR | Status: DC | PRN

## 2019-09-19 MED ORDER — NALBUPHINE HCL 10 MG/ML IJ SOLN: 5.0000 mg | INTRAMUSCULAR | Status: DC | PRN

## 2019-09-19 MED ORDER — HYDROMORPHONE HCL 1 MG/ML IJ SOLN: 0.2500 mg | INTRAMUSCULAR | Status: DC | PRN

## 2019-09-19 MED ORDER — BUPIVACAINE IN DEXTROSE 0.75-8.25 % IT SOLN
INTRATHECAL | Status: DC | PRN
  Administered 2019-09-19: 1.6 mL via INTRATHECAL

## 2019-09-19 MED ORDER — METHYLERGONOVINE MALEATE 0.2 MG PO TABS: 0.2000 mg | ORAL_TABLET | ORAL | Status: DC | PRN

## 2019-09-19 MED ORDER — PROMETHAZINE HCL 25 MG/ML IJ SOLN: 6.2500 mg | INTRAMUSCULAR | Status: DC | PRN

## 2019-09-19 MED ORDER — ZOLPIDEM TARTRATE 5 MG PO TABS: 5.0000 mg | ORAL_TABLET | Freq: Every evening | ORAL | Status: DC | PRN

## 2019-09-19 MED ORDER — KETOROLAC TROMETHAMINE 30 MG/ML IJ SOLN
INTRAMUSCULAR | Status: AC
  Filled 2019-09-19: qty 1

## 2019-09-19 MED ORDER — CEFAZOLIN SODIUM-DEXTROSE 2-4 GM/100ML-% IV SOLN
INTRAVENOUS | Status: AC
  Filled 2019-09-19: qty 100

## 2019-09-19 MED ORDER — SIMETHICONE 80 MG PO CHEW
80.0000 mg | CHEWABLE_TABLET | Freq: Three times a day (TID) | ORAL | Status: DC
  Administered 2019-09-20 – 2019-09-21 (×3): 80 mg via ORAL
  Filled 2019-09-19 (×3): qty 1

## 2019-09-19 SURGICAL SUPPLY — 35 items
APL SKNCLS STERI-STRIP NONHPOA (GAUZE/BANDAGES/DRESSINGS) ×1
BENZOIN TINCTURE PRP APPL 2/3 (GAUZE/BANDAGES/DRESSINGS) ×2 IMPLANT
CHLORAPREP W/TINT 26ML (MISCELLANEOUS) ×2 IMPLANT
CLAMP CORD UMBIL (MISCELLANEOUS) IMPLANT
CLOTH BEACON ORANGE TIMEOUT ST (SAFETY) ×2 IMPLANT
DRSG OPSITE POSTOP 4X10 (GAUZE/BANDAGES/DRESSINGS) ×2 IMPLANT
ELECT REM PT RETURN 9FT ADLT (ELECTROSURGICAL) ×2
ELECTRODE REM PT RTRN 9FT ADLT (ELECTROSURGICAL) ×1 IMPLANT
EXTRACTOR VACUUM M CUP 4 TUBE (SUCTIONS) IMPLANT
GLOVE BIO SURGEON STRL SZ7.5 (GLOVE) ×2 IMPLANT
GLOVE BIOGEL PI IND STRL 7.0 (GLOVE) ×1 IMPLANT
GLOVE BIOGEL PI INDICATOR 7.0 (GLOVE) ×1
GOWN STRL REUS W/TWL LRG LVL3 (GOWN DISPOSABLE) ×4 IMPLANT
KIT ABG SYR 3ML LUER SLIP (SYRINGE) IMPLANT
NEEDLE HYPO 22GX1.5 SAFETY (NEEDLE) ×2 IMPLANT
NEEDLE HYPO 25X5/8 SAFETYGLIDE (NEEDLE) IMPLANT
NEEDLE SPNL 20GX3.5 QUINCKE YW (NEEDLE) IMPLANT
NS IRRIG 1000ML POUR BTL (IV SOLUTION) ×2 IMPLANT
PACK C SECTION WH (CUSTOM PROCEDURE TRAY) ×2 IMPLANT
PENCIL SMOKE EVAC W/HOLSTER (ELECTROSURGICAL) ×2 IMPLANT
STRIP CLOSURE SKIN 1/2X4 (GAUZE/BANDAGES/DRESSINGS) ×2 IMPLANT
SUT MNCRL 0 VIOLET CTX 36 (SUTURE) ×2 IMPLANT
SUT MNCRL AB 3-0 PS2 27 (SUTURE) IMPLANT
SUT MON AB 2-0 CT1 27 (SUTURE) ×2 IMPLANT
SUT MON AB-0 CT1 36 (SUTURE) ×4 IMPLANT
SUT MONOCRYL 0 CTX 36 (SUTURE) ×2
SUT PLAIN 0 NONE (SUTURE) IMPLANT
SUT PLAIN 2 0 (SUTURE)
SUT PLAIN 2 0 XLH (SUTURE) ×2 IMPLANT
SUT PLAIN ABS 2-0 CT1 27XMFL (SUTURE) IMPLANT
SYR 20CC LL (SYRINGE) IMPLANT
SYR CONTROL 10ML LL (SYRINGE) ×2 IMPLANT
TOWEL OR 17X24 6PK STRL BLUE (TOWEL DISPOSABLE) ×2 IMPLANT
TRAY FOLEY W/BAG SLVR 14FR LF (SET/KITS/TRAYS/PACK) ×2 IMPLANT
WATER STERILE IRR 1000ML POUR (IV SOLUTION) ×2 IMPLANT

## 2019-09-19 NOTE — H&P (Signed)
Erin Goodwin is a 24 y.o. female presenting for rpt csection. OB History    Gravida  2   Para  1   Term  1   Preterm      AB      Living  1     SAB      TAB      Ectopic      Multiple      Live Births  1          Past Medical History:  Diagnosis Date  . Chronic constipation   . Chronic posterior anal fissure   . External hemorrhoids   . Family history of early CAD   . Family history of headaches   . GERD (gastroesophageal reflux disease)   . Hirsutism   . History of chicken pox   . Hypotension   . Palpitations   . PCOS (polycystic ovarian syndrome)    Past Surgical History:  Procedure Laterality Date  . CESAREAN SECTION N/A 06/01/2017   Procedure: CESAREAN SECTION;  Surgeon: Olivia Mackie, MD;  Location: [facility] BIRTHING SUIT[facility];  Service: Obstetrics;  Laterality: N/A;  . TONSILLECTOMY AND ADENOIDECTOMY    . WISDOM TOOTH EXTRACTION     Family History: family history includes Breast cancer in her maternal grandmother; COPD in her mother; Cancer (age of onset: 56) in her maternal aunt and mother; Diabetes in her maternal grandfather and mother; Heart attack in her father; Heart attack (age of onset: 42) in her mother; Hyperlipidemia in her mother; Irritable bowel syndrome in her mother. Social History:  reports that she has never smoked. She has never used smokeless tobacco. She reports previous alcohol use. She reports that she does not use drugs.     Maternal Diabetes: No Genetic Screening: Normal Maternal Ultrasounds/Referrals: Normal Fetal Ultrasounds or other Referrals:  None Maternal Substance Abuse:  No Significant Maternal Medications:  None Significant Maternal Lab Results:  Group B Strep negative Other Comments:  None  Review of Systems  Constitutional: Negative.   All other systems reviewed and are negative.  Maternal Medical History:  Fetal activity: Perceived fetal activity is normal.   Last perceived fetal movement was within the past  hour.    Prenatal complications: no prenatal complications Prenatal Complications - Diabetes: none.      There were no vitals taken for this visit. Maternal Exam:  Uterine Assessment: Contraction strength is mild.  Contraction frequency is irregular.   Abdomen: Patient reports no abdominal tenderness. Fetal presentation: vertex  Introitus: Normal vulva. Normal vagina.  Ferning test: not done.  Nitrazine test: not done. Amniotic fluid character: not assessed.  Pelvis: questionable for delivery.   Cervix: Cervix evaluated by digital exam.     Physical Exam Vitals and nursing note reviewed.  Constitutional:      Appearance: Normal appearance.  HENT:     Head: Normocephalic and atraumatic.  Cardiovascular:     Rate and Rhythm: Normal rate and regular rhythm.  Pulmonary:     Effort: Pulmonary effort is normal.     Breath sounds: Normal breath sounds.  Abdominal:     Palpations: Abdomen is soft.  Genitourinary:    General: Normal vulva.  Musculoskeletal:        General: Normal range of motion.     Cervical back: Normal range of motion and neck supple.  Skin:    General: Skin is warm and dry.  Neurological:     General: No focal deficit present.     Mental  Status: She is alert and oriented to person, place, and time.  Psychiatric:        Mood and Affect: Mood normal.        Behavior: Behavior normal.     Prenatal labs: ABO, Rh: --/--/A POS (07/18 0809) Antibody: NEG (07/18 0809) Rubella: Immune (12/22 0000) RPR: NON REACTIVE (07/18 0809)  HBsAg: Negative (12/22 0000)  HIV: Non-reactive (12/22 0000)  GBS: Positive/-- (07/02 0000)   Assessment/Plan: 39 + wk iup Rpt csection Consent done.   Rylon Poitra J 09/19/2019, 6:43 AM

## 2019-09-19 NOTE — Anesthesia Procedure Notes (Signed)
Spinal  Patient location during procedure: OR Staffing Performed: anesthesiologist  Anesthesiologist: Nolon Nations, MD Preanesthetic Checklist Completed: patient identified, IV checked, site marked, risks and benefits discussed, surgical consent, monitors and equipment checked, pre-op evaluation and timeout performed Spinal Block Patient position: sitting Prep: Betadine and site prepped and draped Patient monitoring: heart rate, continuous pulse ox and blood pressure Approach: midline Location: L2-3 Injection technique: single-shot Needle Needle type: Sprotte  Needle gauge: 24 G Needle length: 9 cm Assessment Sensory level: T6 Additional Notes Expiration date of kit checked and confirmed. Patient tolerated procedure well, without complications.

## 2019-09-19 NOTE — Op Note (Signed)
Cesarean Section Procedure Note  Indications: previous uterine incision kerr x one  Pre-operative Diagnosis: 39 week 0 day pregnancy.  Post-operative Diagnosis: same  Surgeon: Lenoard Aden   Assistants: Genice Rouge RNFA  Anesthesia: Local anesthesia 0.25.% bupivacaine and Spinal anesthesia  ASA Class: 2  Procedure Details  The patient was seen in the Holding Room. The risks, benefits, complications, treatment options, and expected outcomes were discussed with the patient.  The patient concurred with the proposed plan, giving informed consent. The risks of anesthesia, infection, bleeding and possible injury to other organs discussed. Injury to bowel, bladder, or ureter with possible need for repair discussed. Possible need for transfusion with secondary risks of hepatitis or HIV acquisition discussed. Post operative complications to include but not limited to DVT, PE and Pneumonia noted. The site of surgery properly noted/marked. The patient was taken to Operating Room # B, identified as Ilina Xu and the procedure verified as C-Section Delivery. A Time Out was held and the above information confirmed.  After induction of anesthesia, the patient was draped and prepped in the usual sterile manner. A Pfannenstiel incision was made and carried down through the subcutaneous tissue to the fascia. Fascial incision was made and extended transversely using Mayo scissors. The fascia was separated from the underlying rectus tissue superiorly and inferiorly. The peritoneum was identified and entered. Peritoneal incision was extended longitudinally. The utero-vesical peritoneal reflection was incised transversely and the bladder flap was bluntly freed from the lower uterine segment. A low transverse uterine incision(Kerr hysterotomy) was made. Delivered from OA presentation was a  female with Apgar scores of 9 at one minute and 9 at five minutes. Bulb suctioning gently performed. Neonatal team in  attendance.After the umbilical cord was clamped and cut cord blood was obtained for evaluation. The placenta was removed intact and appeared normal. The uterus was curetted with a dry lap pack. Good hemostasis was noted.The uterine outline, tubes and ovaries appeared normal. The uterine incision was closed with running locked sutures of 0 Monocryl x 2 layers. Hemostasis was observed. The parietal peritoneum was closed with a running 2-0 Monocryl suture. The fascia was then reapproximated with running sutures of 0 Monocryl. The skin was reapproximated with 3-0 monocryl after [place] closure with 2-0 plain.  Instrument, sponge, and needle counts were correct prior the abdominal closure and at the conclusion of the case.   Findings: FTLF, OA, anterior placenta  Estimated Blood Loss:  400         Drains: foley                 Specimens: placenta                 Complications:  None; patient tolerated the procedure well.         Disposition: PACU - hemodynamically stable.         Condition: stable  Attending Attestation: I performed the procedure.

## 2019-09-19 NOTE — Anesthesia Preprocedure Evaluation (Signed)
Anesthesia Evaluation  Patient identified by MRN, date of birth, ID band Patient awake    Reviewed: Allergy & Precautions, H&P , NPO status , Patient's Chart, lab work & pertinent test results  Airway Mallampati: II  TM Distance: >3 FB Neck ROM: full    Dental no notable dental hx. (+) Teeth Intact, Dental Advisory Given   Pulmonary    Pulmonary exam normal breath sounds clear to auscultation       Cardiovascular negative cardio ROS Normal cardiovascular exam Rhythm:regular Rate:Normal     Neuro/Psych negative neurological ROS     GI/Hepatic Neg liver ROS, GERD  Medicated and Controlled,  Endo/Other  Obesity  Renal/GU negative Renal ROS  negative genitourinary   Musculoskeletal negative musculoskeletal ROS (+)   Abdominal (+) + obese,   Peds  Hematology negative hematology ROS (+)   Anesthesia Other Findings       Reproductive/Obstetrics (+) Pregnancy                             Anesthesia Physical  Anesthesia Plan  ASA: II  Anesthesia Plan: Spinal   Post-op Pain Management:    Induction:   PONV Risk Score and Plan: 3 and Treatment may vary due to age or medical condition, Ondansetron, Scopolamine patch - Pre-op and Dexamethasone  Airway Management Planned: Natural Airway  Additional Equipment: None  Intra-op Plan:   Post-operative Plan:   Informed Consent: I have reviewed the patients History and Physical, chart, labs and discussed the procedure including the risks, benefits and alternatives for the proposed anesthesia with the patient or authorized representative who has indicated his/her understanding and acceptance.     Dental Advisory Given and Dental advisory given  Plan Discussed with: CRNA  Anesthesia Plan Comments:         Anesthesia Quick Evaluation

## 2019-09-19 NOTE — Transfer of Care (Signed)
Immediate Anesthesia Transfer of Care Note  Patient: Erin Goodwin  Procedure(s) Performed: Repeat CESAREAN SECTION (N/A )  Patient Location: PACU  Anesthesia Type:Spinal  Level of Consciousness: awake, alert  and oriented  Airway & Oxygen Therapy: Patient Spontanous Breathing  Post-op Assessment: Report given to RN and Post -op Vital signs reviewed and stable  Post vital signs: Reviewed and stable  Last Vitals:  Vitals Value Taken Time  BP 104/57 09/19/19 1352  Temp    Pulse 75 09/19/19 1355  Resp 14 09/19/19 1355  SpO2 98 % 09/19/19 1355  Vitals shown include unvalidated device data.  Last Pain:  Vitals:   09/19/19 1149  TempSrc: Oral  PainSc: 0-No pain      Patients Stated Pain Goal: 4 (09/19/19 1149)  Complications: No complications documented.

## 2019-09-20 LAB — CBC
HCT: 25.2 % — ABNORMAL LOW (ref 36.0–46.0)
Hemoglobin: 8.1 g/dL — ABNORMAL LOW (ref 12.0–15.0)
MCH: 30 pg (ref 26.0–34.0)
MCHC: 32.1 g/dL (ref 30.0–36.0)
MCV: 93.3 fL (ref 80.0–100.0)
Platelets: 249 10*3/uL (ref 150–400)
RBC: 2.7 MIL/uL — ABNORMAL LOW (ref 3.87–5.11)
RDW: 12.8 % (ref 11.5–15.5)
WBC: 12.3 10*3/uL — ABNORMAL HIGH (ref 4.0–10.5)
nRBC: 0 % (ref 0.0–0.2)

## 2019-09-20 LAB — BIRTH TISSUE RECOVERY COLLECTION (PLACENTA DONATION)

## 2019-09-20 MED ORDER — POLYSACCHARIDE IRON COMPLEX 150 MG PO CAPS
150.0000 mg | ORAL_CAPSULE | Freq: Every day | ORAL | Status: DC
  Administered 2019-09-21: 150 mg via ORAL
  Filled 2019-09-20: qty 1

## 2019-09-20 MED ORDER — OXYCODONE HCL 5 MG PO TABS: 5.0000 mg | ORAL_TABLET | ORAL | Status: DC | PRN

## 2019-09-20 MED ORDER — ACETAMINOPHEN 500 MG PO TABS
1000.0000 mg | ORAL_TABLET | Freq: Four times a day (QID) | ORAL | Status: DC | PRN
  Administered 2019-09-20 – 2019-09-21 (×2): 1000 mg via ORAL
  Filled 2019-09-20 (×2): qty 2

## 2019-09-20 MED ORDER — MAGNESIUM OXIDE 400 (241.3 MG) MG PO TABS
400.0000 mg | ORAL_TABLET | Freq: Every day | ORAL | Status: DC
  Administered 2019-09-21: 400 mg via ORAL
  Filled 2019-09-20: qty 1

## 2019-09-20 MED ORDER — SODIUM CHLORIDE 0.9 % IV SOLN
510.0000 mg | Freq: Once | INTRAVENOUS | Status: AC
  Administered 2019-09-20: 510 mg via INTRAVENOUS
  Filled 2019-09-20: qty 17

## 2019-09-20 NOTE — Progress Notes (Signed)
POSTOPERATIVE DAY # 1 S/P Repeat LTCS, baby girl    S:         Reports feeling tired, minimal discomfort             Tolerating po intake / no nausea / no vomiting / minimal flatus / no BM  Denies dizziness, SOB, or CP             Denies any vaginal bleeding             Pain controlled with Toradol             Up ad lib / ambulatory/ voiding QS without difficulty   Newborn breast and formula feeding   O:  VS: BP (!) 89/52 (BP Location: Left Arm)   Pulse 72   Temp 97.9 F (36.6 C) (Axillary)   Resp 18   Ht 5\' 6"  (1.676 m)   Wt 86.2 kg   SpO2 98%   Breastfeeding Unknown   BMI 30.67 kg/m   Patient Vitals for the past 24 hrs:  BP Temp Temp src Pulse Resp SpO2 Height Weight  09/20/19 0650 (!) 89/52 97.9 F (36.6 C) Axillary 72 18 98 % -- --  09/20/19 0500 (!) 94/50 98.2 F (36.8 C) Axillary (!) 56 17 99 % -- --  09/20/19 0245 (!) 97/59 98.4 F (36.9 C) Axillary (!) 53 18 98 % -- --  09/20/19 0045 (!) 93/57 97.9 F (36.6 C) Axillary (!) 53 17 99 % -- --  09/19/19 2245 (!) 99/51 98.1 F (36.7 C) Axillary 61 18 98 % -- --  09/19/19 2140 101/63 98 F (36.7 C) Axillary (!) 54 17 98 % -- --  09/19/19 2020 (!) 100/54 98.2 F (36.8 C) Axillary (!) 53 18 100 % -- --  09/19/19 1818 (!) 114/51 97.6 F (36.4 C) Oral 62 -- 99 % -- --  09/19/19 1713 114/68 97.8 F (36.6 C) Oral (!) 57 18 100 % -- --  09/19/19 1612 102/60 97.8 F (36.6 C) Axillary (!) 57 18 100 % -- --  09/19/19 1507 104/69 97.6 F (36.4 C) Oral 66 18 100 % -- --  09/19/19 1457 109/70 -- -- 67 -- -- -- --  09/19/19 1452 -- 97.8 F (36.6 C) Oral -- -- -- -- --  09/19/19 1445 99/61 -- -- 72 20 97 % -- --  09/19/19 1430 (!) 102/53 (!) 97.1 F (36.2 C) Oral 67 16 98 % -- --  09/19/19 1417 108/66 -- -- 66 20 98 % -- --  09/19/19 1400 104/61 -- -- 71 15 98 % -- --  09/19/19 1355 (!) 104/57 97.6 F (36.4 C) Oral 74 16 99 % -- --  09/19/19 1149 (!) 137/91 98.6 F (37 C) Oral 82 18 100 % 5\' 6"  (1.676 m) 86.2 kg      LABS:               Recent Labs    09/20/19 0537  WBC 12.3*  HGB 8.1*  PLT 249               Bloodtype: --/--/A POS (07/18 0809)  Rubella: Immune (12/22 0000)                                             I&O: Intake/Output      07/20 0701 - 07/21 0700  07/21 0701 - 07/22 0700   P.O. 500    I.V. (mL/kg) 1441.7 (16.7)    Total Intake(mL/kg) 1941.7 (22.5)    Urine (mL/kg/hr) 2525 900 (3.3)   Blood 346    Total Output 2871 900   Net -929.3 -900                     Physical Exam:             Alert and Oriented X3  Lungs: Clear and unlabored  Heart: regular rate and rhythm / no murmurs  Abdomen: soft, non-tender, mild gaseous distention, active bowel sounds in all quadrants             Fundus: firm, non-tender, U-1             Dressing: honeycomb with steri-strips c/d/i              Incision:  approximated with sutures / no erythema / no ecchymosis / no drainage  Perineum: intact  Lochia: no bleeding noted on pad   Extremities: trace LE edema, no calf pain or tenderness  A/P:    POD # 1 S/P Repeat LTCS            ABL Anemia    - s/p Feraheme x 1 dose   - Begin Niferex 150mg  PO daily and Mag ox 400mg  PO daily tomorrow   Routine postoperative care              Lactation support PRN  Ambulation and warm liquids to promote bowel motility   Interested in early d/c home tomorrow   , MSN, CNM [facility]

## 2019-09-20 NOTE — Lactation Note (Signed)
This note was copied from a baby's chart. Lactation Consultation Note  Patient Name: Erin Goodwin YDSWV'T Date: 09/20/2019 Reason for consult: Initial assessment;Other (Comment) (per [facility]BU RN - mom only desired assistance to obtain her DEBP - UMR process)  Baby is 94 hours old  LC completed the DEBP UMR process .  LC shared with mom she has a LC order if she desires assistance with latching and to just call on her  Nurses light.  Mom did mentioned the baby tends to feed short intervals and not seem satisfied.  LC asked mom what position she is using to latch and she mentioned the cradle .  LC recommended trying the cross cradle 1st and switching arms to cradle once the baby is swallowing well.  Mom may call if needing assistance.    Maternal Data    Feeding Feeding Type: Breast Fed  LATCH Score                   Interventions Interventions: Breast feeding basics reviewed  Lactation Tools Discussed/Used     Consult Status Consult Status: PRN Date: 09/21/19 Follow-up type: In-patient    [name] 09/20/2019, 3:41 PM

## 2019-09-21 DIAGNOSIS — D62 Acute posthemorrhagic anemia: Secondary | ICD-10-CM | POA: Diagnosis not present

## 2019-09-21 MED ORDER — COCONUT OIL OIL: 1.0000 "application " | TOPICAL_OIL | 0 refills | Status: DC | PRN

## 2019-09-21 MED ORDER — SIMETHICONE 80 MG PO CHEW: 80.0000 mg | CHEWABLE_TABLET | ORAL | 0 refills | Status: DC | PRN

## 2019-09-21 MED ORDER — OXYCODONE HCL 5 MG PO TABS: 5.0000 mg | ORAL_TABLET | ORAL | 0 refills | Status: DC | PRN

## 2019-09-21 MED ORDER — POLYSACCHARIDE IRON COMPLEX 150 MG PO CAPS: 150.0000 mg | ORAL_CAPSULE | Freq: Every day | ORAL | Status: DC

## 2019-09-21 MED ORDER — FLUCONAZOLE 150 MG PO TABS
150.0000 mg | ORAL_TABLET | ORAL | 0 refills | Status: AC
Start: 2019-09-21 — End: 2019-09-28

## 2019-09-21 MED ORDER — ACETAMINOPHEN 500 MG PO TABS: 1000.0000 mg | ORAL_TABLET | Freq: Four times a day (QID) | ORAL | 0 refills | Status: DC | PRN

## 2019-09-21 MED ORDER — SENNOSIDES-DOCUSATE SODIUM 8.6-50 MG PO TABS: 2.0000 | ORAL_TABLET | ORAL | Status: DC

## 2019-09-21 MED ORDER — IBUPROFEN 800 MG PO TABS: 800.0000 mg | ORAL_TABLET | Freq: Four times a day (QID) | ORAL | 0 refills | Status: DC

## 2019-09-21 MED ORDER — MAGNESIUM OXIDE -MG SUPPLEMENT 400 (240 MG) MG PO TABS
400.0000 mg | ORAL_TABLET | Freq: Every day | ORAL | Status: DC
Start: 2019-09-21 — End: 2020-04-24

## 2019-09-21 MED FILL — oxyCODONE HCL 5 MG TABS: 5 | 3 days supply | Qty: 30 | Fill #0

## 2019-09-21 MED FILL — ACETAMINOPHEN EXTRA STRENGT: 500 | 13 days supply | Qty: 100 | Fill #0

## 2019-09-21 MED FILL — FLUCONAZOLE 150 MG TABS: 150 | 9 days supply | Qty: 3 | Fill #0

## 2019-09-21 MED FILL — IBUPROFEN 800 MG TAB: 800 | 7 days supply | Qty: 30 | Fill #0

## 2019-09-21 MED FILL — MI-ACID GAS 80 MG TAB CHEW: 80 | 17 days supply | Qty: 100 | Fill #0

## 2019-09-21 NOTE — Discharge Instructions (Signed)
Lactation outpatient support - home visit ° ° °Linda Coppola °RN, MHA, IBCLC °at [facility]: Lactation Consultant ° °[email or web address] ° °

## 2019-09-21 NOTE — Discharge Summary (Signed)
OB Discharge Summary  Patient Name: Erin Goodwin DOB: [birth year] MRN: [id number]  Date of admission: 09/19/2019 Delivering provider: Olivia Mackie   Admitting diagnosis: Previous cesarean delivery affecting pregnancy [O34.219] Previous cesarean section [Z98.891] Intrauterine pregnancy: [redacted]w[redacted]d     Secondary diagnosis: Patient Active Problem List   Diagnosis Date Noted  . Acute blood loss anemia 09/21/2019  . Cesarean delivery delivered 7/20 09/20/2019  . Postpartum care following cesarean delivery 7/20 09/20/2019  . Previous cesarean delivery affecting pregnancy 09/19/2019   Additional problems:none   Date of discharge: 09/21/2019   Discharge diagnosis: Principal Problem:   Postpartum care following cesarean delivery 7/20 Active Problems:   Previous cesarean delivery affecting pregnancy   Cesarean delivery delivered 7/20   Acute blood loss anemia                                                              Post partum procedures:IV Feraheme  Pain control: Spinal  Complications: None  Hospital course:  Sceduled C/S   24 y.o. yo G2P2002 at [redacted]w[redacted]d was admitted to the hospital 09/19/2019 for scheduled cesarean section with the following indication:Elective Repeat.Delivery details are as follows:  Membrane Rupture Time/Date: 1:15 PM ,09/19/2019   Delivery Method:C-Section, Low Transverse  Details of operation can be found in separate operative note.  Patient had an uncomplicated postpartum course.  She is ambulating, tolerating a regular diet, passing flatus, and urinating well. Patient is discharged home in stable condition on  09/21/19        Newborn Data: Birth date:09/19/2019  Birth time:1:15 PM  Gender:Female  Living status:Living  Apgars:9 ,9  Weight:3000 g     Physical exam  Vitals:   09/20/19 0500 09/20/19 0650 09/20/19 2051 09/21/19 0548  BP: (!) 94/50 (!) 89/52 112/68 109/67  Pulse: (!) 56 72 66 (!) 59  Resp: 17 18 15 18   Temp: 98.2 F (36.8 C) 97.9 F (36.6  C) 98.8 F (37.1 C) 98.6 F (37 C)  TempSrc: Axillary Axillary Oral Oral  SpO2: 99% 98% 99% 99%  Weight:      Height:       General: alert, cooperative and no distress Lochia: appropriate Uterine Fundus: firm Incision: Healing well with no significant drainage, Dressing is clean, dry, and intact DVT Evaluation: No cords or calf tenderness. No significant calf/ankle edema. Labs: Lab Results  Component Value Date   WBC 12.3 (H) 09/20/2019   HGB 8.1 (L) 09/20/2019   HCT 25.2 (L) 09/20/2019   MCV 93.3 09/20/2019   PLT 249 09/20/2019   CMP Latest Ref Rng & Units 03/11/2019  Glucose 70 - 99 mg/dL 98  BUN 6 - 20 mg/dL 6  Creatinine 05/09/2019 - 3.55 mg/dL 7.32  Sodium 2.02 - 542 mmol/L 135  Potassium 3.5 - 5.1 mmol/L 3.7  Chloride 98 - 111 mmol/L 104  CO2 22 - 32 mmol/L 22  Calcium 8.9 - 10.3 mg/dL 9.2  Total Protein 6.5 - 8.1 g/dL 7.3  Total Bilirubin 0.3 - 1.2 mg/dL 0.6  Alkaline Phos 38 - 126 U/L 49  AST 15 - 41 U/L 17  ALT 0 - 44 U/L 13   Edinburgh Postnatal Depression Scale Screening Tool 09/19/2019  I have been able to laugh and see the funny side of things. 0  I have looked  forward with enjoyment to things. 0  I have blamed myself unnecessarily when things went wrong. 1  I have been anxious or worried for no good reason. 1  I have felt scared or panicky for no good reason. 0  Things have been getting on top of me. 1  I have been so unhappy that I have had difficulty sleeping. 0  I have felt sad or miserable. 0  I have been so unhappy that I have been crying. 0  The thought of harming myself has occurred to me. 0  Edinburgh Postnatal Depression Scale Total 3  Some encounter information is confidential and restricted. Go to Review Flowsheets activity to see all data.    Discharge instruction:  per After Visit Summary,  [facility] booklet and  "Understanding Mother & Baby Care" hospital booklet  After Visit Meds:  Allergies as of 09/21/2019   No Known Allergies      Medication List    STOP taking these medications   Proctozone-HC 2.5 % rectal cream Generic drug: hydrocortisone     TAKE these medications   acetaminophen 500 MG tablet Commonly known as: TYLENOL Take 2 tablets (1,000 mg total) by mouth every 6 (six) hours as needed for moderate pain.   calcium carbonate 500 MG chewable tablet Commonly known as: TUMS - dosed in mg elemental calcium Chew 1 tablet by mouth daily as needed for indigestion or heartburn.   coconut oil Oil Apply 1 application topically as needed.   esomeprazole 20 MG capsule Commonly known as: NEXIUM Take 20 mg by mouth daily as needed (acid reflux).   fluconazole 150 MG tablet Commonly known as: Diflucan Take 1 tablet (150 mg total) by mouth every 3 (three) days for 3 doses.   hydrocortisone cream 1 % Apply 1 application topically daily as needed for itching.   ibuprofen 800 MG tablet Commonly known as: ADVIL Take 1 tablet (800 mg total) by mouth every 6 (six) hours.   iron polysaccharides 150 MG capsule Commonly known as: Ferrex 150 Take 1 capsule (150 mg total) by mouth daily.   Linzess 290 MCG Caps capsule Generic drug: linaclotide Take 290 mcg by mouth daily before breakfast.   Magnesium Oxide 400 (240 Mg) MG Tabs Take 1 tablet (400 mg total) by mouth daily. For prevention of constipation.   omeprazole 20 MG capsule Commonly known as: PRILOSEC Take 1 capsule (20 mg total) by mouth daily.   oxyCODONE 5 MG immediate release tablet Commonly known as: Oxy IR/ROXICODONE Take 1-2 tablets (5-10 mg total) by mouth every 4 (four) hours as needed for severe pain.   prenatal multivitamin Tabs tablet Take 1 tablet by mouth daily at 12 noon.   senna-docusate 8.6-50 MG tablet Commonly known as: Senokot-S Take 2 tablets by mouth daily. Start taking on: September 22, 2019   simethicone 80 MG chewable tablet Commonly known as: MYLICON Chew 1 tablet (80 mg total) by mouth as needed for flatulence.        Diet: routine diet  Activity: Advance as tolerated. Pelvic rest for 6 weeks.   Postpartum contraception: Not Discussed  Newborn Data: Live born female  Birth Weight: 6 lb 9.8 oz (3000 g) APGAR: 9, 9  Newborn Delivery   Birth date/time: 09/19/2019 13:15:00 Delivery type: C-Section, Low Transverse Trial of labor: No C-section categorization: Repeat    Baby Feeding: Breast Disposition:home with mother   Delivery Report:  Review the Delivery Report for details.    Follow up:  Follow-up Information  Olivia Mackie, MD. Schedule an appointment as soon as possible for a visit in 6 week(s).   Specialty: Obstetrics and Gynecology Contact information: [address] [place] [address] [phone]                 Signed: Cipriano Mile, MSN 09/21/2019, 11:30 AM

## 2019-09-22 ENCOUNTER — Encounter (HOSPITAL_BASED_OUTPATIENT_CLINIC_OR_DEPARTMENT_OTHER): Payer: Self-pay

## 2019-09-22 ENCOUNTER — Emergency Department (HOSPITAL_BASED_OUTPATIENT_CLINIC_OR_DEPARTMENT_OTHER)
Admission: EM | Admit: 2019-09-22 | Discharge: 2019-09-22 | Disposition: A | Discharge status: Home / Self Care | Payer: 59 | Attending: Emergency Medicine | Admitting: Emergency Medicine

## 2019-09-22 ENCOUNTER — Emergency Department (HOSPITAL_BASED_OUTPATIENT_CLINIC_OR_DEPARTMENT_OTHER): Payer: 59

## 2019-09-22 ENCOUNTER — Other Ambulatory Visit: Payer: Self-pay

## 2019-09-22 DIAGNOSIS — R0602 Shortness of breath: Secondary | ICD-10-CM

## 2019-09-22 DIAGNOSIS — J90 Pleural effusion, not elsewhere classified: Secondary | ICD-10-CM | POA: Diagnosis not present

## 2019-09-22 DIAGNOSIS — R0781 Pleurodynia: Secondary | ICD-10-CM

## 2019-09-22 LAB — CBC WITH DIFFERENTIAL/PLATELET
Abs Immature Granulocytes: 0.23 10*3/uL — ABNORMAL HIGH (ref 0.00–0.07)
Basophils Absolute: 0.1 10*3/uL (ref 0.0–0.1)
Basophils Relative: 1 %
Eosinophils Absolute: 0.1 10*3/uL (ref 0.0–0.5)
Eosinophils Relative: 1 %
HCT: 32.5 % — ABNORMAL LOW (ref 36.0–46.0)
Hemoglobin: 10.3 g/dL — ABNORMAL LOW (ref 12.0–15.0)
Immature Granulocytes: 3 %
Lymphocytes Relative: 16 %
Lymphs Abs: 1.2 10*3/uL (ref 0.7–4.0)
MCH: 30.1 pg (ref 26.0–34.0)
MCHC: 31.7 g/dL (ref 30.0–36.0)
MCV: 95 fL (ref 80.0–100.0)
Monocytes Absolute: 0.6 10*3/uL (ref 0.1–1.0)
Monocytes Relative: 8 %
Neutro Abs: 5 10*3/uL (ref 1.7–7.7)
Neutrophils Relative %: 71 %
Platelets: 305 10*3/uL (ref 150–400)
RBC: 3.42 MIL/uL — ABNORMAL LOW (ref 3.87–5.11)
RDW: 13 % (ref 11.5–15.5)
WBC: 7.2 10*3/uL (ref 4.0–10.5)
nRBC: 0 % (ref 0.0–0.2)

## 2019-09-22 LAB — BASIC METABOLIC PANEL
Anion gap: 10 (ref 5–15)
BUN: 6 mg/dL (ref 6–20)
CO2: 23 mmol/L (ref 22–32)
Calcium: 9 mg/dL (ref 8.9–10.3)
Chloride: 104 mmol/L (ref 98–111)
Creatinine, Ser: 0.6 mg/dL (ref 0.44–1.00)
GFR calc Af Amer: >60 mL/min (ref >60)
GFR calc non Af Amer: >60 mL/min (ref >60)
Glucose, Bld: 93 mg/dL (ref 70–99)
Potassium: 3.9 mmol/L (ref 3.5–5.1)
Sodium: 137 mmol/L (ref 135–145)

## 2019-09-22 LAB — D-DIMER, QUANTITATIVE: D-Dimer, Quant: 1.1 ug/mL-FEU — ABNORMAL HIGH (ref 0.00–0.50)

## 2019-09-22 MED ORDER — IOHEXOL 350 MG/ML SOLN
100.0000 mL | Freq: Once | INTRAVENOUS | Status: AC | PRN
  Administered 2019-09-22: 100 mL via INTRAVENOUS

## 2019-09-22 MED ORDER — ALBUTEROL SULFATE HFA 108 (90 BASE) MCG/ACT IN AERS
2.0000 | INHALATION_SPRAY | RESPIRATORY_TRACT | Status: DC | PRN
  Administered 2019-09-22: 2 via RESPIRATORY_TRACT
  Filled 2019-09-22: qty 6.7

## 2019-09-22 NOTE — ED Triage Notes (Signed)
Pt had a C-section on 7/20. Today pt had sudden onset of ShOB with associated pain between shoulder blades and in her chest. Pain is described as sharp and the pain palliated with rest.

## 2019-09-22 NOTE — ED Provider Notes (Signed)
[facility] Provider Note: Lowella Dell, MD, FACEP  CSN: [id number] MRN: [id number] ARRIVAL: 09/22/19 at 2037 ROOM: [facility]07/[facility]07   CHIEF COMPLAINT  Shortness of Breath   HISTORY OF PRESENT ILLNESS  09/22/19 10:42 PM Erin Goodwin is a 24 y.o. female who underwent a cesarean section for failure to progress on 09/19/2019.  Today she had the sudden onset of shortness of breath and associated pain between her shoulder blades and in her chest.  She describes the pain as sharp and worse with exertion or deep breathing, better with rest.  She rates her pain as a 5 out of 10.  Her shortness of breath is worse with exertion too.  It is not severe.  She is having soreness at her C-section site, no worse than what she anticipated.  She is having no fever.   Past Medical History:  Diagnosis Date   Chronic constipation    Chronic posterior anal fissure    External hemorrhoids    Family history of early CAD    Family history of headaches    GERD (gastroesophageal reflux disease)    Hirsutism    History of chicken pox    Hypotension    Palpitations    PCOS (polycystic ovarian syndrome)     Past Surgical History:  Procedure Laterality Date   CESAREAN SECTION N/A 06/01/2017   Procedure: CESAREAN SECTION;  Surgeon: Olivia Mackie, MD;  [facility];  Service: Obstetrics;  Laterality: N/A;   CESAREAN SECTION N/A 09/19/2019   Procedure: Repeat CESAREAN SECTION;  Surgeon: Olivia Mackie, MD;  Location: [facility];  Service: Obstetrics;  Laterality: N/A;  EDD: 09/26/19 Tracey RNFA   TONSILLECTOMY AND ADENOIDECTOMY     WISDOM TOOTH EXTRACTION      Family History  Problem Relation Age of Onset   Hyperlipidemia Mother    Diabetes Mother    Cancer Mother 35       Uterus,Skin,Thyroid   Heart attack Mother 66   Irritable bowel syndrome Mother    COPD Mother    Heart attack Father    Cancer Maternal Aunt 30       Breast   Diabetes  Maternal Grandfather    Breast cancer Maternal Grandmother     Social History   Tobacco Use   Smoking status: Never Smoker   Smokeless tobacco: Never Used  Vaping Use   Vaping Use: Never used  Substance Use Topics   Alcohol use: Not Currently    Alcohol/week: 0.0 standard drinks    Comment: occasional   Drug use: No    Prior to Admission medications   Medication Sig Start Date End Date Taking? Authorizing Provider  acetaminophen (TYLENOL) 500 MG tablet Take 2 tablets (1,000 mg total) by mouth every 6 (six) hours as needed for moderate pain. 09/21/19   Neta Mends, CNM  calcium carbonate (TUMS - DOSED IN MG ELEMENTAL CALCIUM) 500 MG chewable tablet Chew 1 tablet by mouth daily as needed for indigestion or heartburn.    [provider]  coconut oil OIL Apply 1 application topically as needed. 09/21/19   Neta Mends, CNM  esomeprazole (NEXIUM) 20 MG capsule Take 20 mg by mouth daily as needed (acid reflux).    [provider]  fluconazole (DIFLUCAN) 150 MG tablet Take 1 tablet (150 mg total) by mouth every 3 (three) days for 3 doses. 09/21/19 09/28/19  Neta Mends, CNM  hydrocortisone cream 1 % Apply 1 application topically daily  as needed for itching.    [provider]  ibuprofen (ADVIL) 800 MG tablet Take 1 tablet (800 mg total) by mouth every 6 (six) hours. 09/21/19   Neta Mends, CNM  iron polysaccharides (FERREX 150) 150 MG capsule Take 1 capsule (150 mg total) by mouth daily. 09/21/19   Neta Mends, CNM  Magnesium Oxide 400 (240 Mg) MG TABS Take 1 tablet (400 mg total) by mouth daily. For prevention of constipation. 09/21/19   Neta Mends, CNM  oxyCODONE (OXY IR/ROXICODONE) 5 MG immediate release tablet Take 1-2 tablets (5-10 mg total) by mouth every 4 (four) hours as needed for severe pain. 09/21/19   Neta Mends, CNM  Prenatal Vit-Fe Fumarate-FA (PRENATAL MULTIVITAMIN) TABS tablet Take 1 tablet by mouth daily at 12 noon.     [provider]  senna-docusate (SENOKOT-S) 8.6-50 MG tablet Take 2 tablets by mouth daily. 09/22/19   Neta Mends, CNM  simethicone (MYLICON) 80 MG chewable tablet Chew 1 tablet (80 mg total) by mouth as needed for flatulence. 09/21/19   Neta Mends, CNM  omeprazole (PRILOSEC) 20 MG capsule Take 1 capsule (20 mg total) by mouth daily. Patient not taking: Reported on 09/05/2019 11/26/17 09/22/19  Waldon Merl, PA-C    Allergies Patient has no known allergies.   REVIEW OF SYSTEMS  Negative except as noted here or in the History of Present Illness.   PHYSICAL EXAMINATION  Initial Vital Signs Blood pressure (!) 135/87, pulse 76, temperature 98.2 F (36.8 C), temperature source Oral, resp. rate 20, height 5\' 6"  (1.676 m), weight 84.8 kg, SpO2 100 %, unknown if currently breastfeeding.  Examination General: Well-developed, well-nourished female in no acute distress; appearance consistent with age of record HENT: normocephalic; atraumatic Eyes: pupils equal, round and reactive to light; extraocular muscles intact Neck: supple Heart: regular rate and rhythm Lungs: Mildly coarse sounds bilaterally Abdomen: soft; nondistended; mildly tender low transverse incision without signs of infection; bowel sounds present Extremities: No deformity; full range of motion; pulses normal Neurologic: Awake, alert and oriented; motor function intact in all extremities and symmetric; no facial droop Skin: Warm and dry Psychiatric: Normal mood and affect   RESULTS  Summary of this visit's results, reviewed and interpreted by myself:   EKG Interpretation  Date/Time:  Friday September 22 2019 20:46:11 EDT Ventricular Rate:  68 PR Interval:  142 QRS Duration: 72 QT Interval:  366 QTC Calculation: 389 R Axis:   74 Text Interpretation: Normal sinus rhythm with sinus arrhythmia Normal ECG Rate is slower Confirmed by Terran Hollenkamp (09-01-1979) on 09/22/2019 10:51:48 PM      Laboratory  Studies: Results for orders placed or performed during the hospital encounter of 09/22/19 (from the past 24 hour(s))  CBC with Differential     Status: Abnormal   Collection Time: 09/22/19  9:06 PM  Result Value Ref Range   WBC 7.2 4.0 - 10.5 K/uL   RBC 3.42 (L) 3.87 - 5.11 MIL/uL   Hemoglobin 10.3 (L) 12.0 - 15.0 g/dL   HCT 09/24/19 (L) 36 - 46 %   MCV 95.0 80.0 - 100.0 fL   MCH 30.1 26.0 - 34.0 pg   MCHC 31.7 30.0 - 36.0 g/dL   RDW 22.2 97.9 - 89.2 %   Platelets 305 150 - 400 K/uL   nRBC 0.0 0.0 - 0.2 %   Neutrophils Relative % 71 %   Neutro Abs 5.0 1.7 - 7.7 K/uL   Lymphocytes Relative 16 %  Lymphs Abs 1.2 0.7 - 4.0 K/uL   Monocytes Relative 8 %   Monocytes Absolute 0.6 0 - 1 K/uL   Eosinophils Relative 1 %   Eosinophils Absolute 0.1 0 - 0 K/uL   Basophils Relative 1 %   Basophils Absolute 0.1 0 - 0 K/uL   Immature Granulocytes 3 %   Abs Immature Granulocytes 0.23 (H) 0.00 - 0.07 K/uL  Basic metabolic panel     Status: None   Collection Time: 09/22/19  9:06 PM  Result Value Ref Range   Sodium 137 135 - 145 mmol/L   Potassium 3.9 3.5 - 5.1 mmol/L   Chloride 104 98 - 111 mmol/L   CO2 23 22 - 32 mmol/L   Glucose, Bld 93 70 - 99 mg/dL   BUN 6 6 - 20 mg/dL   Creatinine, Ser 1.610.60 0.44 - 1.00 mg/dL   Calcium 9.0 8.9 - 09.610.3 mg/dL   GFR calc non Af Amer >60 >60 mL/min   GFR calc Af Amer >60 >60 mL/min   Anion gap 10 5 - 15  D-dimer, quantitative (not at [facility])     Status: Abnormal   Collection Time: 09/22/19  9:06 PM  Result Value Ref Range   D-Dimer, Quant 1.10 (H) 0.00 - 0.50 ug/mL-FEU   Imaging Studies: CT Angio Chest PE W/Cm &/Or Wo Cm  Result Date: 09/22/2019 CLINICAL DATA:  Cesarean section 09/19/2019, sudden onset shortness of breath and interscapular chest pain EXAM: CT ANGIOGRAPHY CHEST WITH CONTRAST TECHNIQUE: Multidetector CT imaging of the chest was performed using the standard protocol during bolus administration of intravenous contrast. Multiplanar CT image  reconstructions and MIPs were obtained to evaluate the vascular anatomy. CONTRAST:  100mL OMNIPAQUE IOHEXOL 350 MG/ML SOLN COMPARISON:  02/21/2016 FINDINGS: Cardiovascular: This is a technically adequate evaluation of the pulmonary vasculature. There are no filling defects or pulmonary emboli. The heart is unremarkable without pericardial effusion. Thoracic aorta is normal in caliber with no aneurysm or dissection. Mediastinum/Nodes: No enlarged mediastinal, hilar, or axillary lymph nodes. Thyroid gland, trachea, and esophagus demonstrate no significant findings. Lungs/Pleura: There are trace bilateral pleural effusions. No airspace disease or pneumothorax. Central airways are patent. Upper Abdomen: No acute abnormality. Musculoskeletal: No acute or destructive bony lesions. Reconstructed images demonstrate no additional findings. Review of the MIP images confirms the above findings. IMPRESSION: 1. No evidence of pulmonary embolus. 2. Unremarkable thoracic aorta. 3. Trace bilateral pleural effusions. Electronically Signed   By: Sharlet SalinaMichael  Brown M.D.   On: 09/22/2019 22:21    ED COURSE and MDM  Nursing notes, initial and subsequent vitals signs, including pulse oximetry, reviewed and interpreted by myself.  Vitals:   09/22/19 2041 09/22/19 2042 09/22/19 2240  BP: (!) 135/87  125/78  Pulse: 76  90  Resp: 20    Temp: 98.2 F (36.8 C)    TempSrc: Oral    SpO2: 100%  100%  Weight:  84.8 kg   Height:  5\' 6"  (1.676 m)    Medications  albuterol (VENTOLIN HFA) 108 (90 Base) MCG/ACT inhaler 2 puff (has no administration in time range)  iohexol (OMNIPAQUE) 350 MG/ML injection 100 mL (100 mLs Intravenous Contrast Given 09/22/19 2210)   The patient's presentation was concerning for postpartum pulmonary embolism but her CT angio is negative.  Elevated D-dimer likely due to recent C-section.  Her shortness of breath could be related to bronchospasm and we will provide an albuterol inhaler.   PROCEDURES   Procedures   ED DIAGNOSES  ICD-10-CM   1. Shortness of breath  R06.02   2. Pleuritic chest pain  R07.81      Paula Libra, MD 09/22/19 2311

## 2019-10-02 NOTE — Anesthesia Postprocedure Evaluation (Signed)
Anesthesia Post Note  Patient: Erin Goodwin  Procedure(s) Performed: Repeat CESAREAN SECTION (N/A )     Patient location during evaluation: PACU Anesthesia Type: Spinal Level of consciousness: awake and alert Pain management: pain level controlled Vital Signs Assessment: post-procedure vital signs reviewed and stable Respiratory status: spontaneous breathing and respiratory function stable Cardiovascular status: blood pressure returned to baseline and stable Postop Assessment: spinal receding Anesthetic complications: no   No complications documented.  Last Vitals:  Vitals:   09/20/19 2051 09/21/19 0548  BP: 112/68 109/67  Pulse: 66 (!) 59  Resp: 15 18  Temp: 37.1 C 37 C  SpO2: 99% 99%    Last Pain:  Vitals:   09/21/19 0915  TempSrc:   PainSc: 0-No pain                 Lewie Loron

## 2019-10-30 DIAGNOSIS — H9203 Otalgia, bilateral: Secondary | ICD-10-CM | POA: Diagnosis not present

## 2019-10-30 DIAGNOSIS — J029 Acute pharyngitis, unspecified: Secondary | ICD-10-CM | POA: Diagnosis not present

## 2019-10-30 DIAGNOSIS — R05 Cough: Secondary | ICD-10-CM | POA: Diagnosis not present

## 2019-10-30 DIAGNOSIS — R0981 Nasal congestion: Secondary | ICD-10-CM | POA: Diagnosis not present

## 2019-11-01 ENCOUNTER — Other Ambulatory Visit (HOSPITAL_BASED_OUTPATIENT_CLINIC_OR_DEPARTMENT_OTHER): Payer: Self-pay | Admitting: Obstetrics and Gynecology

## 2019-11-01 DIAGNOSIS — Z23 Encounter for immunization: Secondary | ICD-10-CM | POA: Diagnosis not present

## 2019-11-01 MED FILL — NORETHINDRONE 0.35 MG TAB: 0.35 | 84 days supply | Qty: 84 | Fill #0

## 2019-11-16 DIAGNOSIS — H5213 Myopia, bilateral: Secondary | ICD-10-CM | POA: Diagnosis not present

## 2019-11-16 DIAGNOSIS — H52223 Regular astigmatism, bilateral: Secondary | ICD-10-CM | POA: Diagnosis not present

## 2019-11-29 DIAGNOSIS — Z23 Encounter for immunization: Secondary | ICD-10-CM | POA: Diagnosis not present

## 2020-01-26 MED FILL — NORETHINDRONE 0.35 MG TAB: 0.35 | 84 days supply | Qty: 84 | Fill #1

## 2020-04-11 ENCOUNTER — Other Ambulatory Visit (HOSPITAL_BASED_OUTPATIENT_CLINIC_OR_DEPARTMENT_OTHER): Payer: Self-pay | Admitting: Obstetrics and Gynecology

## 2020-04-11 MED FILL — BLISOVI FE 1/20 1-20 MG-MCG: 1-20 | 84 days supply | Qty: 84 | Fill #0

## 2020-04-24 ENCOUNTER — Other Ambulatory Visit: Payer: Self-pay

## 2020-04-24 ENCOUNTER — Ambulatory Visit: Payer: 59 | Admitting: Family Medicine

## 2020-04-24 ENCOUNTER — Encounter: Payer: Self-pay | Admitting: Family Medicine

## 2020-04-24 VITALS — BP 104/70 | HR 76 | Temp 98.2°F | Ht 66.0 in | Wt 176.8 lb

## 2020-04-24 DIAGNOSIS — Z0000 Encounter for general adult medical examination without abnormal findings: Secondary | ICD-10-CM

## 2020-04-24 DIAGNOSIS — Z0001 Encounter for general adult medical examination with abnormal findings: Secondary | ICD-10-CM | POA: Diagnosis not present

## 2020-04-24 DIAGNOSIS — Z8349 Family history of other endocrine, nutritional and metabolic diseases: Secondary | ICD-10-CM

## 2020-04-24 DIAGNOSIS — N926 Irregular menstruation, unspecified: Secondary | ICD-10-CM | POA: Diagnosis not present

## 2020-04-24 DIAGNOSIS — D500 Iron deficiency anemia secondary to blood loss (chronic): Secondary | ICD-10-CM | POA: Diagnosis not present

## 2020-04-24 NOTE — Progress Notes (Signed)
New Patient Office Visit  Assessment & Plan:  1. Well adult exam Preventive health education provided.  Record release signed for pap smear. - CBC with Differential/Platelet - CMP14+EGFR - Thyroid Panel With TSH  2. Abnormal menstrual cycle OB/GYN just changed oral birth control.  3. Family history of Hashimoto thyroiditis Labs to assess. - Thyroid Panel With TSH  4. Blood loss anemia Labs to assess. - CBC with Differential/Platelet   Follow-up: Return in about 1 year (around 04/24/2021) for annual physical.   Hendricks Limes, MSN, APRN, FNP-C [facility]  Subjective:  Patient ID: Erin Goodwin, female    DOB: [birth year]  Age: 25 y.o. MRN: [id number]  Patient Care Team: Delorse Limber as PCP - General (Family Medicine) Brien Few, MD as Consulting Physician (Obstetrics and Gynecology)  CC:  Chief Complaint  Patient presents with  . New Patient (Initial Visit)    [place]  . Establish Care    HPI Erin Goodwin presents to establish care.   She does report she is on her fourth period in the last 8 weeks but her OB/GYN just changed her birth control.  She has a family history of Hashimoto's in her mother and would like her thyroid checked with her blood work.   Review of Systems  Constitutional: Negative for chills, fever, malaise/fatigue and weight loss.  HENT: Negative for congestion, ear discharge, ear pain, nosebleeds, sinus pain, sore throat and tinnitus.   Eyes: Negative for blurred vision, double vision, pain, discharge and redness.  Respiratory: Negative for cough, shortness of breath and wheezing.   Cardiovascular: Negative for chest pain, palpitations and leg swelling.  Gastrointestinal: Positive for abdominal pain. Negative for constipation, diarrhea, heartburn, nausea and vomiting.  Genitourinary: Negative for dysuria, frequency and urgency.  Musculoskeletal: Negative for myalgias.  Skin:  Negative for rash.  Neurological: Negative for dizziness, seizures, weakness and headaches.  Psychiatric/Behavioral: Negative for depression, substance abuse and suicidal ideas. The patient is not nervous/anxious.     Current Outpatient Medications:  .  BLISOVI FE 1/20 1-20 MG-MCG tablet, Take 1 tablet by mouth daily., Disp: , Rfl:  .  calcium carbonate (TUMS - DOSED IN MG ELEMENTAL CALCIUM) 500 MG chewable tablet, Chew 1 tablet by mouth daily as needed for indigestion or heartburn., Disp: , Rfl:  .  esomeprazole (NEXIUM) 20 MG capsule, Take 20 mg by mouth daily as needed (acid reflux)., Disp: , Rfl:   No Known Allergies  Past Medical History:  Diagnosis Date  . Chronic constipation   . Chronic posterior anal fissure   . External hemorrhoids   . Family history of early CAD   . Family history of headaches   . GERD (gastroesophageal reflux disease)   . Hirsutism   . History of chicken pox   . Hypotension   . Migraines   . Palpitations   . PCOS (polycystic ovarian syndrome)     Past Surgical History:  Procedure Laterality Date  . CESAREAN SECTION N/A 06/01/2017   Procedure: CESAREAN SECTION;  Surgeon: Brien Few, MD;  Location: [place];  Service: Obstetrics;  Laterality: N/A;  . CESAREAN SECTION N/A 09/19/2019   Procedure: Repeat CESAREAN SECTION;  Surgeon: Brien Few, MD;  Location: [place] [facility];  Service: Obstetrics;  Laterality: N/A;  EDD: 09/26/19 Tracey RNFA  . TONSILLECTOMY AND ADENOIDECTOMY    . WISDOM TOOTH EXTRACTION      Family History  Problem Relation Age of Onset  . Hyperlipidemia Mother   .  Diabetes Mother   . Cancer Mother 20       Uterus,Skin,Thyroid  . Heart attack Mother 49  . Irritable bowel syndrome Mother   . COPD Mother   . Heart attack Father   . Cancer Maternal Aunt 30       Breast  . Diabetes Maternal Grandfather   . Breast cancer Maternal Grandmother   . ADD / ADHD Brother     Social History   Socioeconomic History  .  Marital status: Married    Spouse name: Not on file  . Number of children: Not on file  . Years of education: Not on file  . Highest education level: Not on file  Occupational History  . Not on file  Tobacco Use  . Smoking status: Never Smoker  . Smokeless tobacco: Never Used  Vaping Use  . Vaping Use: Never used  Substance and Sexual Activity  . Alcohol use: Yes    Alcohol/week: 0.0 standard drinks    Comment: occasional  . Drug use: No  . Sexual activity: Yes    Partners: Male    Birth control/protection: Pill    Comment: pill previously  Other Topics Concern  . Not on file  Social History Narrative   single, no children   [facility] [place]    had a photography business in the past   One caffeinated beverage daily   05/28/2016   Social Determinants of Health   Financial Resource Strain: Not on file  Food Insecurity: Not on file  Transportation Needs: Not on file  Physical Activity: Not on file  Stress: Not on file  Social Connections: Not on file  Intimate Partner Violence: Not on file    Objective:   Today's Vitals: BP 104/70   Pulse 76   Temp 98.2 F (36.8 C) (Temporal)   Ht _0  (1.676 m)   Wt 176 lb 12.8 oz (80.2 kg)   LMP 04/24/2020 (Approximate)   SpO2 97%   BMI 28.54 kg/m   Physical Exam Vitals reviewed.  Constitutional:      General: She is not in acute distress.    Appearance: Normal appearance. She is not ill-appearing, toxic-appearing or diaphoretic.  HENT:     Head: Normocephalic and atraumatic.     Right Ear: Tympanic membrane, ear canal and external ear normal. There is no impacted cerumen.     Left Ear: Tympanic membrane, ear canal and external ear normal. There is no impacted cerumen.     Nose: Nose normal. No congestion or rhinorrhea.     Mouth/Throat:     Mouth: Mucous membranes are moist.     Pharynx: Oropharynx is clear. No oropharyngeal exudate or posterior oropharyngeal erythema.  Eyes:     General: No scleral icterus.        Right eye: No discharge.        Left eye: No discharge.     Conjunctiva/sclera: Conjunctivae normal.     Pupils: Pupils are equal, round, and reactive to light.  Cardiovascular:     Rate and Rhythm: Normal rate and regular rhythm.     Heart sounds: Normal heart sounds. No murmur heard. No friction rub. No gallop.   Pulmonary:     Effort: Pulmonary effort is normal. No respiratory distress.     Breath sounds: Normal breath sounds. No stridor. No wheezing, rhonchi or rales.  Abdominal:     General: Abdomen is flat. Bowel sounds are normal. There is no distension.  Palpations: Abdomen is soft. There is no mass.     Tenderness: There is abdominal tenderness in the right lower quadrant and left lower quadrant. There is no guarding or rebound.     Hernia: No hernia is present.  Musculoskeletal:        General: Normal range of motion.     Cervical back: Normal range of motion and neck supple. No rigidity. No muscular tenderness.  Lymphadenopathy:     Cervical: No cervical adenopathy.  Skin:    General: Skin is warm and dry.     Capillary Refill: Capillary refill takes less than 2 seconds.  Neurological:     General: No focal deficit present.     Mental Status: She is alert and oriented to person, place, and time. Mental status is at baseline.  Psychiatric:        Mood and Affect: Mood normal.        Behavior: Behavior normal.        Thought Content: Thought content normal.        Judgment: Judgment normal.

## 2020-04-24 NOTE — Patient Instructions (Signed)
Preventive Care 21-27 Years Old, Female Preventive care refers to lifestyle choices and visits with your health care provider that can promote health and wellness. This includes:  A yearly physical exam. This is also called an annual wellness visit.  Regular dental and eye exams.  Immunizations.  Screening for certain conditions.  Healthy lifestyle choices, such as: ? Eating a healthy diet. ? Getting regular exercise. ? Not using drugs or products that contain nicotine and tobacco. ? Limiting alcohol use. What can I expect for my preventive care visit? Physical exam Your health care provider may check your:  Height and weight. These may be used to calculate your BMI (body mass index). BMI is a measurement that tells if you are at a healthy weight.  Heart rate and blood pressure.  Body temperature.  Skin for abnormal spots. Counseling Your health care provider may ask you questions about your:  Past medical problems.  Family's medical history.  Alcohol, tobacco, and drug use.  Emotional well-being.  Home life and relationship well-being.  Sexual activity.  Diet, exercise, and sleep habits.  Work and work environment.  Access to firearms.  Method of birth control.  Menstrual cycle.  Pregnancy history. What immunizations do I need? Vaccines are usually given at various ages, according to a schedule. Your health care provider will recommend vaccines for you based on your age, medical history, and lifestyle or other factors, such as travel or where you work.   What tests do I need? Blood tests  Lipid and cholesterol levels. These may be checked every 5 years starting at age 20.  Hepatitis C test.  Hepatitis B test. Screening  Diabetes screening. This is done by checking your blood sugar (glucose) after you have not eaten for a while (fasting).  STD (sexually transmitted disease) testing, if you are at risk.  BRCA-related cancer screening. This may be  done if you have a family history of breast, ovarian, tubal, or peritoneal cancers.  Pelvic exam and Pap test. This may be done every 3 years starting at age 21. Starting at age 30, this may be done every 5 years if you have a Pap test in combination with an HPV test. Talk with your health care provider about your test results, treatment options, and if necessary, the need for more tests.   Follow these instructions at home: Eating and drinking  Eat a healthy diet that includes fresh fruits and vegetables, whole grains, lean protein, and low-fat dairy products.  Take vitamin and mineral supplements as recommended by your health care provider.  Do not drink alcohol if: ? Your health care provider tells you not to drink. ? You are pregnant, may be pregnant, or are planning to become pregnant.  If you drink alcohol: ? Limit how much you have to 0-1 drink a day. ? Be aware of how much alcohol is in your drink. In the [place] one drink equals one 12 oz bottle of beer (355 mL), one 5 oz glass of wine (148 mL), or one 1 oz glass of hard liquor (44 mL).   Lifestyle  Take daily care of your teeth and gums. Brush your teeth every morning and night with fluoride toothpaste. Floss one time each day.  Stay active. Exercise for at least 30 minutes 5 or more days each week.  Do not use any products that contain nicotine or tobacco, such as cigarettes, e-cigarettes, and chewing tobacco. If you need help quitting, ask your health care provider.  Do not   use drugs.  If you are sexually active, practice safe sex. Use a condom or other form of protection to prevent STIs (sexually transmitted infections).  If you do not wish to become pregnant, use a form of birth control. If you plan to become pregnant, see your health care provider for a prepregnancy visit.  Find healthy ways to cope with stress, such as: ? Meditation, yoga, or listening to music. ? Journaling. ? Talking to a trusted  person. ? Spending time with friends and family. Safety  Always wear your seat belt while driving or riding in a vehicle.  Do not drive: ? If you have been drinking alcohol. Do not ride with someone who has been drinking. ? When you are tired or distracted. ? While texting.  Wear a helmet and other protective equipment during sports activities.  If you have firearms in your house, make sure you follow all gun safety procedures.  Seek help if you have been physically or sexually abused. What's next?  Go to your health care provider once a year for an annual wellness visit.  Ask your health care provider how often you should have your eyes and teeth checked.  Stay up to date on all vaccines. This information is not intended to replace advice given to you by your health care provider. Make sure you discuss any questions you have with your health care provider. Document Revised: 10/15/2019 Document Reviewed: 10/28/2017 Elsevier Patient Education  2021 [employer]  

## 2020-04-25 LAB — CBC WITH DIFFERENTIAL/PLATELET
Basophils Absolute: 0.1 x10E3/uL (ref 0.0–0.2)
Basos: 1 %
EOS (ABSOLUTE): 0 x10E3/uL (ref 0.0–0.4)
Eos: 0 %
Hematocrit: 38.3 % (ref 34.0–46.6)
Hemoglobin: 13 g/dL (ref 11.1–15.9)
Immature Grans (Abs): 0 x10E3/uL (ref 0.0–0.1)
Immature Granulocytes: 1 %
Lymphocytes Absolute: 3.5 x10E3/uL — ABNORMAL HIGH (ref 0.7–3.1)
Lymphs: 43 %
MCH: 31.9 pg (ref 26.6–33.0)
MCHC: 33.9 g/dL (ref 31.5–35.7)
MCV: 94 fL (ref 79–97)
Monocytes Absolute: 0.5 x10E3/uL (ref 0.1–0.9)
Monocytes: 6 %
Neutrophils Absolute: 4 x10E3/uL (ref 1.4–7.0)
Neutrophils: 49 %
Platelets: 393 x10E3/uL (ref 150–450)
RBC: 4.07 x10E6/uL (ref 3.77–5.28)
RDW: 12.2 % (ref 11.7–15.4)
WBC: 8.1 x10E3/uL (ref 3.4–10.8)

## 2020-04-25 LAB — CMP14+EGFR
ALT: 16 IU/L (ref 0–32)
AST: 17 IU/L (ref 0–40)
Albumin/Globulin Ratio: 1.7 (ref 1.2–2.2)
Albumin: 4.5 g/dL (ref 3.9–5.0)
Alkaline Phosphatase: 71 IU/L (ref 44–121)
BUN/Creatinine Ratio: 12 (ref 9–23)
BUN: 9 mg/dL (ref 6–20)
Bilirubin Total: <0.2 mg/dL (ref 0.0–1.2)
CO2: 23 mmol/L (ref 20–29)
Calcium: 9.4 mg/dL (ref 8.7–10.2)
Chloride: 102 mmol/L (ref 96–106)
Creatinine, Ser: 0.75 mg/dL (ref 0.57–1.00)
GFR calc Af Amer: 127 mL/min/{1.73_m2} (ref >59)
GFR calc non Af Amer: 110 mL/min/{1.73_m2} (ref >59)
Globulin, Total: 2.6 g/dL (ref 1.5–4.5)
Glucose: 84 mg/dL (ref 65–99)
Potassium: 4.4 mmol/L (ref 3.5–5.2)
Sodium: 139 mmol/L (ref 134–144)
Total Protein: 7.1 g/dL (ref 6.0–8.5)

## 2020-04-25 LAB — THYROID PANEL WITH TSH
Free Thyroxine Index: 2.2 (ref 1.2–4.9)
T3 Uptake Ratio: 23 % — ABNORMAL LOW (ref 24–39)
T4, Total: 9.5 ug/dL (ref 4.5–12.0)
TSH: 0.473 u[IU]/mL (ref 0.450–4.500)

## 2020-07-04 ENCOUNTER — Other Ambulatory Visit (HOSPITAL_BASED_OUTPATIENT_CLINIC_OR_DEPARTMENT_OTHER): Payer: Self-pay

## 2020-07-04 MED FILL — Norethindrone Ace & Ethinyl Estradiol-FE Tab 1 MG-20 MCG: ORAL | 84 days supply | Qty: 84 | Fill #0 | Status: AC

## 2020-07-30 DIAGNOSIS — M9901 Segmental and somatic dysfunction of cervical region: Secondary | ICD-10-CM | POA: Diagnosis not present

## 2020-07-30 DIAGNOSIS — M9902 Segmental and somatic dysfunction of thoracic region: Secondary | ICD-10-CM | POA: Diagnosis not present

## 2020-07-30 DIAGNOSIS — M9903 Segmental and somatic dysfunction of lumbar region: Secondary | ICD-10-CM | POA: Diagnosis not present

## 2020-07-30 DIAGNOSIS — M62830 Muscle spasm of back: Secondary | ICD-10-CM | POA: Diagnosis not present

## 2020-07-31 DIAGNOSIS — M9901 Segmental and somatic dysfunction of cervical region: Secondary | ICD-10-CM | POA: Diagnosis not present

## 2020-07-31 DIAGNOSIS — M62830 Muscle spasm of back: Secondary | ICD-10-CM | POA: Diagnosis not present

## 2020-07-31 DIAGNOSIS — M9902 Segmental and somatic dysfunction of thoracic region: Secondary | ICD-10-CM | POA: Diagnosis not present

## 2020-07-31 DIAGNOSIS — M9903 Segmental and somatic dysfunction of lumbar region: Secondary | ICD-10-CM | POA: Diagnosis not present

## 2020-08-01 DIAGNOSIS — M9902 Segmental and somatic dysfunction of thoracic region: Secondary | ICD-10-CM | POA: Diagnosis not present

## 2020-08-01 DIAGNOSIS — M62830 Muscle spasm of back: Secondary | ICD-10-CM | POA: Diagnosis not present

## 2020-08-01 DIAGNOSIS — M9901 Segmental and somatic dysfunction of cervical region: Secondary | ICD-10-CM | POA: Diagnosis not present

## 2020-08-01 DIAGNOSIS — M9903 Segmental and somatic dysfunction of lumbar region: Secondary | ICD-10-CM | POA: Diagnosis not present

## 2020-08-07 DIAGNOSIS — M62830 Muscle spasm of back: Secondary | ICD-10-CM | POA: Diagnosis not present

## 2020-08-07 DIAGNOSIS — M9902 Segmental and somatic dysfunction of thoracic region: Secondary | ICD-10-CM | POA: Diagnosis not present

## 2020-08-07 DIAGNOSIS — M9901 Segmental and somatic dysfunction of cervical region: Secondary | ICD-10-CM | POA: Diagnosis not present

## 2020-08-07 DIAGNOSIS — M9903 Segmental and somatic dysfunction of lumbar region: Secondary | ICD-10-CM | POA: Diagnosis not present

## 2020-08-08 DIAGNOSIS — M62830 Muscle spasm of back: Secondary | ICD-10-CM | POA: Diagnosis not present

## 2020-08-08 DIAGNOSIS — M9901 Segmental and somatic dysfunction of cervical region: Secondary | ICD-10-CM | POA: Diagnosis not present

## 2020-08-08 DIAGNOSIS — M9902 Segmental and somatic dysfunction of thoracic region: Secondary | ICD-10-CM | POA: Diagnosis not present

## 2020-08-08 DIAGNOSIS — M9903 Segmental and somatic dysfunction of lumbar region: Secondary | ICD-10-CM | POA: Diagnosis not present

## 2020-08-15 DIAGNOSIS — M9902 Segmental and somatic dysfunction of thoracic region: Secondary | ICD-10-CM | POA: Diagnosis not present

## 2020-08-15 DIAGNOSIS — M9901 Segmental and somatic dysfunction of cervical region: Secondary | ICD-10-CM | POA: Diagnosis not present

## 2020-08-15 DIAGNOSIS — M62830 Muscle spasm of back: Secondary | ICD-10-CM | POA: Diagnosis not present

## 2020-08-15 DIAGNOSIS — M9903 Segmental and somatic dysfunction of lumbar region: Secondary | ICD-10-CM | POA: Diagnosis not present

## 2020-08-22 DIAGNOSIS — M9903 Segmental and somatic dysfunction of lumbar region: Secondary | ICD-10-CM | POA: Diagnosis not present

## 2020-08-22 DIAGNOSIS — M9902 Segmental and somatic dysfunction of thoracic region: Secondary | ICD-10-CM | POA: Diagnosis not present

## 2020-08-22 DIAGNOSIS — M9901 Segmental and somatic dysfunction of cervical region: Secondary | ICD-10-CM | POA: Diagnosis not present

## 2020-08-22 DIAGNOSIS — M62830 Muscle spasm of back: Secondary | ICD-10-CM | POA: Diagnosis not present

## 2020-11-08 ENCOUNTER — Ambulatory Visit: Payer: 59 | Admitting: Family Medicine

## 2020-11-08 ENCOUNTER — Other Ambulatory Visit: Payer: Self-pay

## 2020-11-08 ENCOUNTER — Encounter: Payer: Self-pay | Admitting: Family Medicine

## 2020-11-08 VITALS — BP 112/78 | HR 82 | Temp 98.1°F | Ht 66.0 in | Wt 166.0 lb

## 2020-11-08 DIAGNOSIS — G90A Postural orthostatic tachycardia syndrome (POTS): Secondary | ICD-10-CM

## 2020-11-08 DIAGNOSIS — I498 Other specified cardiac arrhythmias: Secondary | ICD-10-CM | POA: Diagnosis not present

## 2020-11-08 NOTE — Patient Instructions (Signed)
Reminder: please return after mid-September for your flu shot. If you receive this elsewhere, such as your pharmacy, please let us know so we can get this documented in your chart. We have flu clinic days scheduled during the month of October, if you would like to go ahead and schedule for this.  

## 2020-11-08 NOTE — Progress Notes (Signed)
Assessment & Plan:  1. POTS (postural orthostatic tachycardia syndrome) - Ambulatory referral to Cardiology   Return if symptoms worsen or fail to improve.  Deliah Boston, MSN, APRN, FNP-C [facility]  Subjective:    Patient ID: Erin Goodwin, female    DOB: 05/04/1995, 25 y.o.   MRN: [id number]  Patient Care Team: Gwenlyn Fudge, FNP as PCP - General (Family Medicine) Olivia Mackie, MD as Consulting Physician (Obstetrics and Gynecology)   Chief Complaint:  Chief Complaint  Patient presents with   Referral    Cardiology- Patient is going to be a surrogate and she needs to be cleared by cardiology since she has a history of POTS & tachycardia.     HPI: Erin Goodwin is a 25 y.o. female presenting on 11/08/2020 for Referral (Cardiology- Patient is going to be a surrogate and she needs to be cleared by cardiology since she has a history of POTS & tachycardia. )  Patient is requesting a referral to cardiology to get cleared to be a surrogate mother. She reports a history of POTS.  New complaints: None   Social history:  Relevant past medical, surgical, family and social history reviewed and updated as indicated. Interim medical history since our last visit reviewed.  Allergies and medications reviewed and updated.  DATA REVIEWED: CHART IN EPIC  ROS: Negative unless specifically indicated above in HPI.    Current Outpatient Medications:    calcium carbonate (TUMS - DOSED IN MG ELEMENTAL CALCIUM) 500 MG chewable tablet, Chew 1 tablet by mouth daily as needed for indigestion or heartburn., Disp: , Rfl:    No Known Allergies Past Medical History:  Diagnosis Date   Chronic constipation    Chronic posterior anal fissure    External hemorrhoids    Family history of early CAD    Family history of headaches    GERD (gastroesophageal reflux disease)    Hirsutism    History of chicken pox    Hypotension    Migraines     Palpitations    PCOS (polycystic ovarian syndrome)     Past Surgical History:  Procedure Laterality Date   CESAREAN SECTION N/A 06/01/2017   Procedure: CESAREAN SECTION;  Surgeon: Olivia Mackie, MD;  [facility]: [facility];  Service: Obstetrics;  Laterality: N/A;   CESAREAN SECTION N/A 09/19/2019   Procedure: Repeat CESAREAN SECTION;  Surgeon: Olivia Mackie, MD;  Location: [facility];  Service: Obstetrics;  Laterality: N/A;  EDD: 09/26/19 Tracey RNFA   TONSILLECTOMY AND ADENOIDECTOMY     WISDOM TOOTH EXTRACTION      Social History   Socioeconomic History   Marital status: Married    Spouse name: Not on file   Number of children: Not on file   Years of education: Not on file   Highest education level: Not on file  Occupational History   Not on file  Tobacco Use   Smoking status: Never   Smokeless tobacco: Never  Vaping Use   Vaping Use: Never used  Substance and Sexual Activity   Alcohol use: Yes    Alcohol/week: 0.0 standard drinks    Comment: occasional   Drug use: No   Sexual activity: Yes    Partners: Male    Birth control/protection: Pill    Comment: pill previously  Other Topics Concern   Not on file  Social History Narrative   single, no children   [facility] [place]    had a photography  business in the past   One caffeinated beverage daily   05/28/2016   Social Determinants of Health   Financial Resource Strain: Not on file  Food Insecurity: Not on file  Transportation Needs: Not on file  Physical Activity: Not on file  Stress: Not on file  Social Connections: Not on file  Intimate Partner Violence: Not on file        Objective:    BP 112/78   Pulse 82   Temp 98.1 F (36.7 C) (Temporal)   Ht 5\' 6"  (1.676 m)   Wt 166 lb (75.3 kg)   SpO2 99%   BMI 26.79 kg/m   Wt Readings from Last 3 Encounters:  11/08/20 166 lb (75.3 kg)  04/24/20 176 lb 12.8 oz (80.2 kg)  09/22/19 187 lb (84.8 kg)    Physical Exam Vitals reviewed.   Constitutional:      General: She is not in acute distress.    Appearance: Normal appearance. She is overweight. She is not ill-appearing, toxic-appearing or diaphoretic.  HENT:     Head: Normocephalic and atraumatic.  Eyes:     General: No scleral icterus.       Right eye: No discharge.        Left eye: No discharge.     Conjunctiva/sclera: Conjunctivae normal.  Cardiovascular:     Rate and Rhythm: Normal rate.  Pulmonary:     Effort: Pulmonary effort is normal. No respiratory distress.  Musculoskeletal:        General: Normal range of motion.     Cervical back: Normal range of motion.  Skin:    General: Skin is warm and dry.     Capillary Refill: Capillary refill takes less than 2 seconds.  Neurological:     General: No focal deficit present.     Mental Status: She is alert and oriented to person, place, and time. Mental status is at baseline.  Psychiatric:        Mood and Affect: Mood normal.        Behavior: Behavior normal.        Thought Content: Thought content normal.        Judgment: Judgment normal.    Lab Results  Component Value Date   TSH 0.473 04/24/2020   Lab Results  Component Value Date   WBC 8.1 04/24/2020   HGB 13.0 04/24/2020   HCT 38.3 04/24/2020   MCV 94 04/24/2020   PLT 393 04/24/2020   Lab Results  Component Value Date   NA 139 04/24/2020   K 4.4 04/24/2020   CO2 23 04/24/2020   GLUCOSE 84 04/24/2020   BUN 9 04/24/2020   CREATININE 0.75 04/24/2020   BILITOT <0.2 04/24/2020   ALKPHOS 71 04/24/2020   AST 17 04/24/2020   ALT 16 04/24/2020   PROT 7.1 04/24/2020   ALBUMIN 4.5 04/24/2020   CALCIUM 9.4 04/24/2020   ANIONGAP 10 09/22/2019   GFR 98.87 08/31/2018   Lab Results  Component Value Date   CHOL 160 08/31/2018   Lab Results  Component Value Date   HDL 41.90 08/31/2018   Lab Results  Component Value Date   LDLCALC 96 08/31/2018   Lab Results  Component Value Date   TRIG 109.0 08/31/2018   Lab Results  Component  Value Date   CHOLHDL 4 08/31/2018   Lab Results  Component Value Date   HGBA1C 5.2 08/31/2018

## 2020-11-10 ENCOUNTER — Encounter: Payer: Self-pay | Admitting: Family Medicine

## 2020-12-31 ENCOUNTER — Other Ambulatory Visit: Payer: Self-pay

## 2020-12-31 ENCOUNTER — Encounter: Payer: Self-pay | Admitting: Cardiology

## 2020-12-31 ENCOUNTER — Encounter: Payer: Self-pay | Admitting: Family Medicine

## 2020-12-31 ENCOUNTER — Ambulatory Visit (INDEPENDENT_AMBULATORY_CARE_PROVIDER_SITE_OTHER): Payer: 59 | Admitting: Cardiology

## 2020-12-31 VITALS — BP 112/64 | HR 67 | Ht 66.0 in | Wt 168.4 lb

## 2020-12-31 DIAGNOSIS — G90A Postural orthostatic tachycardia syndrome (POTS): Secondary | ICD-10-CM | POA: Diagnosis not present

## 2020-12-31 NOTE — Progress Notes (Signed)
Electrophysiology Office Note   Date:  12/31/2020   ID:  Erin, Goodwin 12-14-1995, MRN [id number]  PCP:  Gwenlyn Fudge, FNP  Cardiologist:   Primary Electrophysiologist:  Tesha Archambeau Jorja Loa, MD    Chief Complaint: POTS   History of Present Illness: Erin Goodwin is a 25 y.o. female who is being seen today for the evaluation of POTS at the request of Gwenlyn Fudge, FNP. Presenting today for electrophysiology evaluation.  She has a history significant for tachycardia, POTS.  She was last seen in 2018 with palpitations.  She got palpitations and doing minimal activities.  She was started on metoprolol at that time.  Since being seen in clinic, she has had 2 children.  When she got pregnant with her first child, she had no symptoms of POTS during or after her pregnancy.  She does have fast heart rates, but this only happens when she is exercising.  This does not occur at rest.  She does not have symptoms similar to her POTS.  She is presented to clinic today without symptoms.  She is presenting as she wants to be a surrogate mother.  She needs sign off from cardiology.  Today, she denies symptoms of palpitations, chest pain, shortness of breath, orthopnea, PND, lower extremity edema, claudication, dizziness, presyncope, syncope, bleeding, or neurologic sequela. The patient is tolerating medications without difficulties.    Past Medical History:  Diagnosis Date   Chronic constipation    Chronic posterior anal fissure    External hemorrhoids    Family history of early CAD    Family history of headaches    GERD (gastroesophageal reflux disease)    Hirsutism    History of chicken pox    Hypotension    Migraines    Palpitations    PCOS (polycystic ovarian syndrome)    Past Surgical History:  Procedure Laterality Date   CESAREAN SECTION N/A 06/01/2017   Procedure: CESAREAN SECTION;  Surgeon: Olivia Mackie, MD;  [facility]: [facility];   Service: Obstetrics;  Laterality: N/A;   CESAREAN SECTION N/A 09/19/2019   Procedure: Repeat CESAREAN SECTION;  Surgeon: Olivia Mackie, MD;  Location: [facility];  Service: Obstetrics;  Laterality: N/A;  EDD: 09/26/19 Tracey RNFA   TONSILLECTOMY AND ADENOIDECTOMY     WISDOM TOOTH EXTRACTION       Current Outpatient Medications  Medication Sig Dispense Refill   calcium carbonate (TUMS - DOSED IN MG ELEMENTAL CALCIUM) 500 MG chewable tablet Chew 1 tablet by mouth daily as needed for indigestion or heartburn.     No current facility-administered medications for this visit.    Allergies:   Patient has no known allergies.   Social History:  The patient  reports that she has never smoked. She has never used smokeless tobacco. She reports current alcohol use. She reports that she does not use drugs.   Family History:  The patient's family history includes ADD / ADHD in her brother; Breast cancer in her maternal grandmother; COPD in her mother; Cancer (age of onset: 72) in her maternal aunt and mother; Diabetes in her maternal grandfather and mother; Heart attack in her father; Heart attack (age of onset: 24) in her mother; Hyperlipidemia in her mother; Irritable bowel syndrome in her mother.    ROS:  Please see the history of present illness.   Otherwise, review of systems is positive for none.   All other systems are reviewed and negative.    PHYSICAL EXAM: VS:  BP 112/64   Pulse 67   Ht 5\' 6"  (1.676 m)   Wt 168 lb 6.4 oz (76.4 kg)   SpO2 98%   BMI 27.18 kg/m  , BMI Body mass index is 27.18 kg/m. GEN: Well nourished, well developed, in no acute distress  HEENT: normal  Neck: no JVD, carotid bruits, or masses Cardiac: RRR; no murmurs, rubs, or gallops,no edema  Respiratory:  clear to auscultation bilaterally, normal work of breathing GI: soft, nontender, nondistended, + BS MS: no deformity or atrophy  Skin: warm and dry Neuro:  Strength and sensation are intact Psych: euthymic  mood, full affect  EKG:  EKG is ordered today. Personal review of the ekg ordered shows sinus rhythm  Recent Labs: 04/24/2020: ALT 16; BUN 9; Creatinine, Ser 0.75; Hemoglobin 13.0; Platelets 393; Potassium 4.4; Sodium 139; TSH 0.473    Lipid Panel     Component Value Date/Time   CHOL 160 08/31/2018 0851   TRIG 109.0 08/31/2018 0851   HDL 41.90 08/31/2018 0851   CHOLHDL 4 08/31/2018 0851   VLDL 21.8 08/31/2018 0851   LDLCALC 96 08/31/2018 0851     Wt Readings from Last 3 Encounters:  12/31/20 168 lb 6.4 oz (76.4 kg)  11/08/20 166 lb (75.3 kg)  04/24/20 176 lb 12.8 oz (80.2 kg)      Other studies Reviewed: Additional studies/ records that were reviewed today include: TTE 2018  Review of the above records today demonstrates:  - Left ventricle: The cavity size was normal. Wall thickness was    normal. Systolic function was normal. The estimated ejection    fraction was in the range of 60% to 65%. Wall motion was normal;    there were no regional wall motion abnormalities. Left    ventricular diastolic function parameters were normal.    ASSESSMENT AND PLAN:  1.  POTS: She has done well over the last 4 years.  She has had no further POTS symptoms since having her first child.  She is not on any medications.  She is overall comfortable with the way that she has been feeling.  She does wish to become a surrogate parent.  She had an echo in 2018 which showed no major abnormalities.  As she has had no further symptoms from her POTS in the last few years, I do not see any reason that the surrogate parenthood would be contraindicated.  She should be fine to move forward with her plans.  This Irineo Gaulin be discussed with her case discussed with surrogate company  Current medicines are reviewed at length with the patient today.   The patient does not have concerns regarding her medicines.  The following changes were made today:  none  Labs/ tests ordered today include:  Orders Placed This  Encounter  Procedures   EKG 12-Lead     Disposition:   FU with Rosealynn Mateus as needed  Signed, Alexei Ey 2019, MD  12/31/2020 12:08 PM     [facility] [address] [place] [phone] (office) [phone] (fax)

## 2021-01-02 ENCOUNTER — Telehealth: Payer: Self-pay | Admitting: Cardiology

## 2021-01-02 NOTE — Telephone Encounter (Signed)
Patient is requesting to have office visit notes from 12/31/20 appointment with Dr. Elberta Fortis faxed to the [facility] at fax#, [phone] (ATTN: Lucky Cowboy). She states this was discussed during her appointment.

## 2021-01-02 NOTE — Telephone Encounter (Signed)
Pt aware note faxed waiting on confirmation sheet ./cy

## 2021-03-01 IMAGING — US US THYROID
1 series · 14 of 25 positions shown · non-contrast
Comparison: None.

CLINICAL DATA: Palpable abnormality. Thyromegaly on physical
examination.

EXAM:
THYROID ULTRASOUND
TECHNIQUE: Ultrasound examination of the thyroid gland and adjacent soft
tissues was performed.

[Series 1: us thyroid · 0.04mm/px · 14 of 46 slices shown]
[im 1/46]
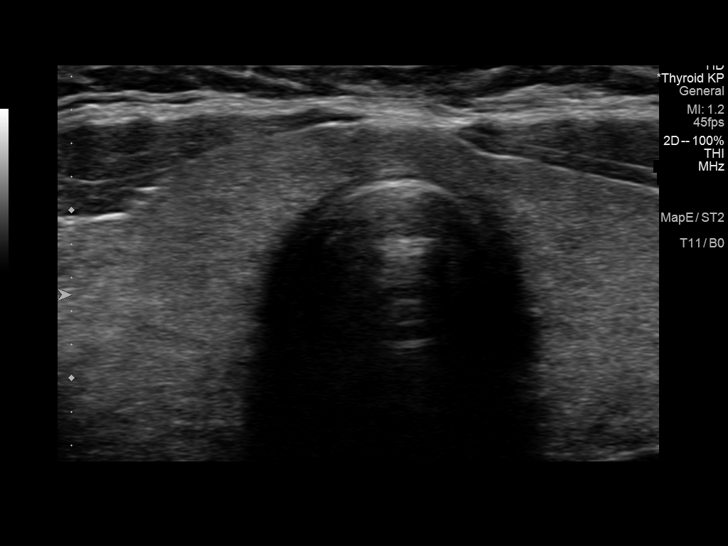
[im 4/46]
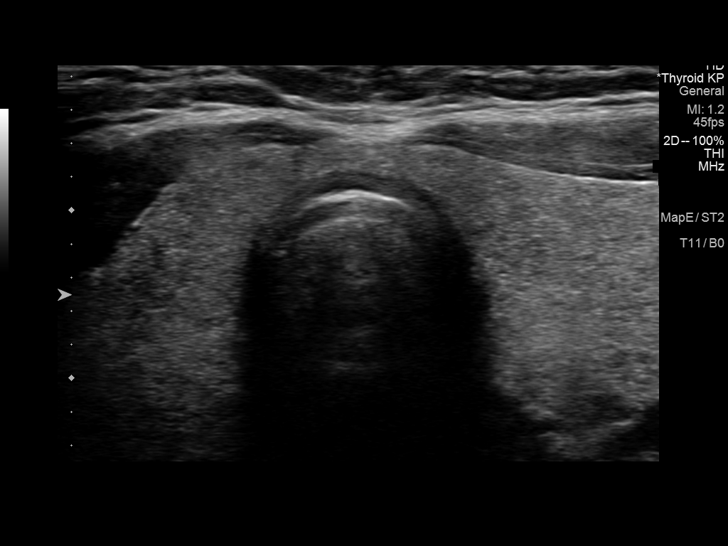
[im 8/46]
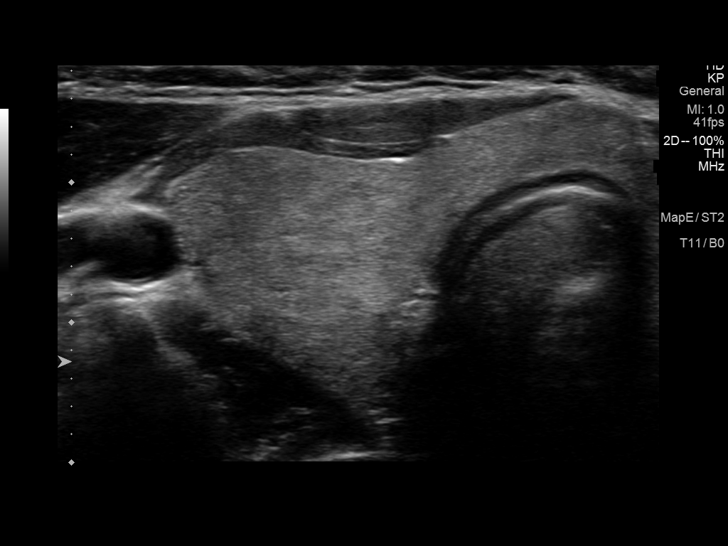
[im 12/46]
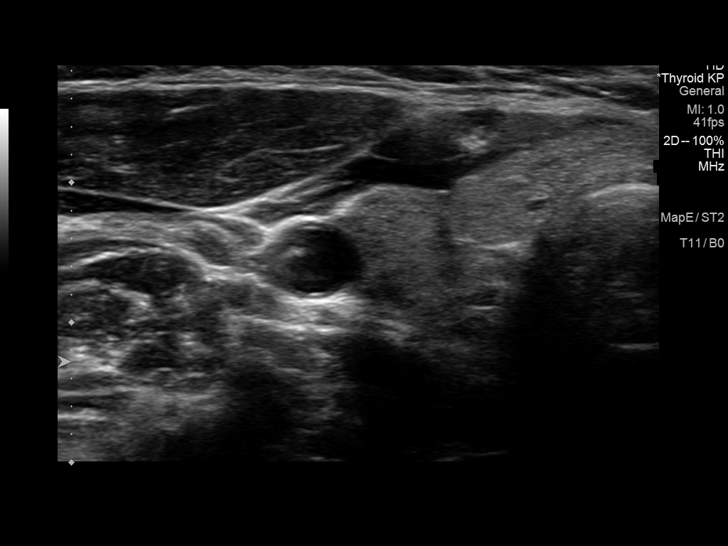
[im 16/46]
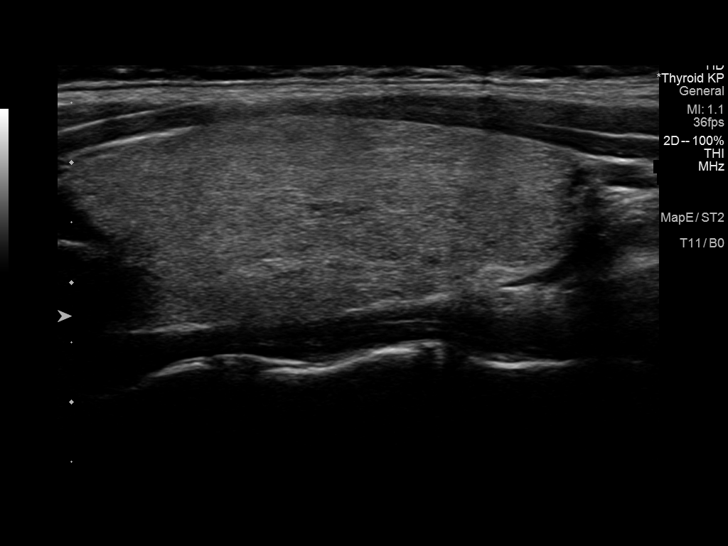
[im 17/46]
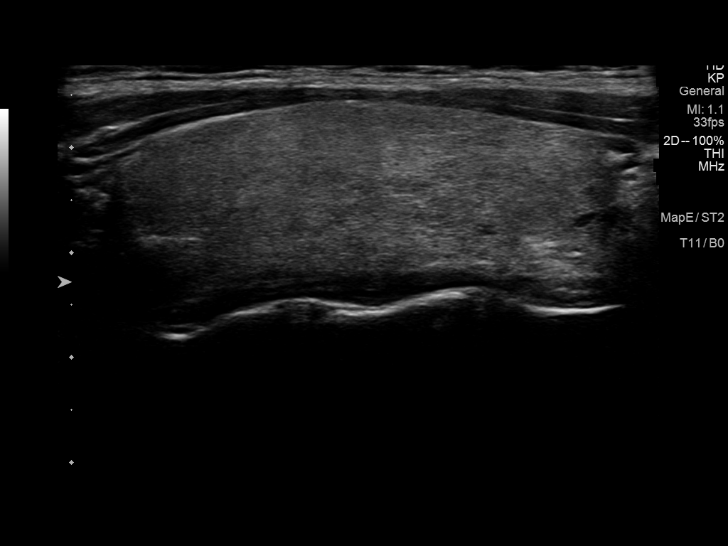
[im 21/46]
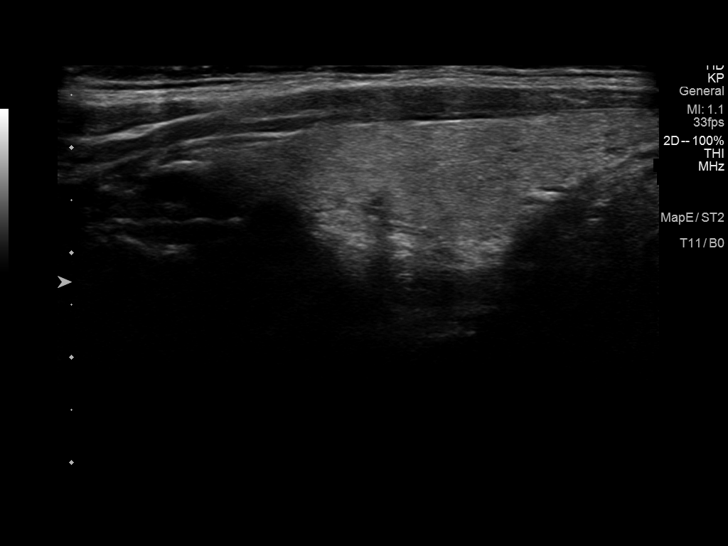
[im 25/46]
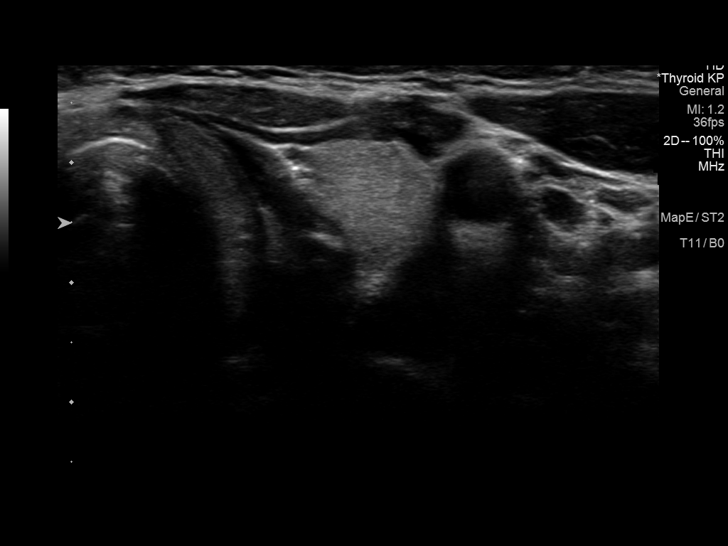
[im 29/46]
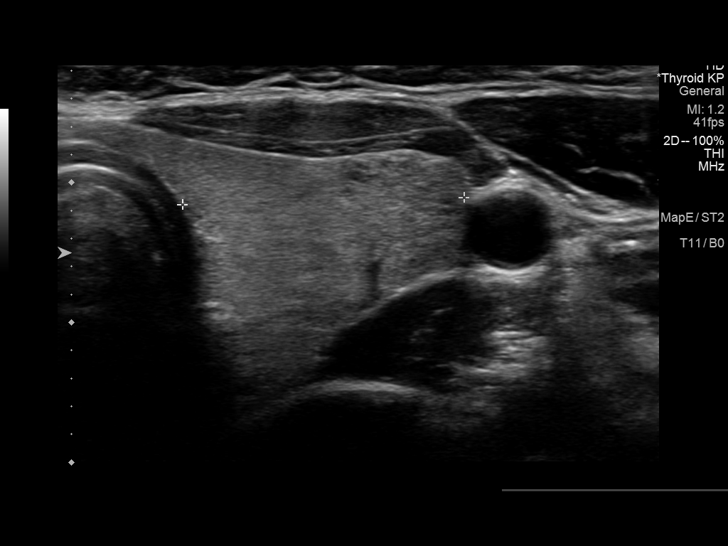
[im 31/46]
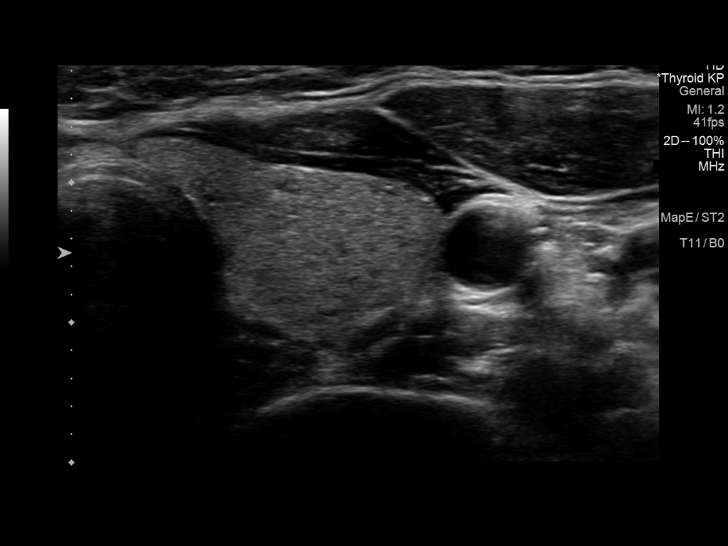
[im 34/46]
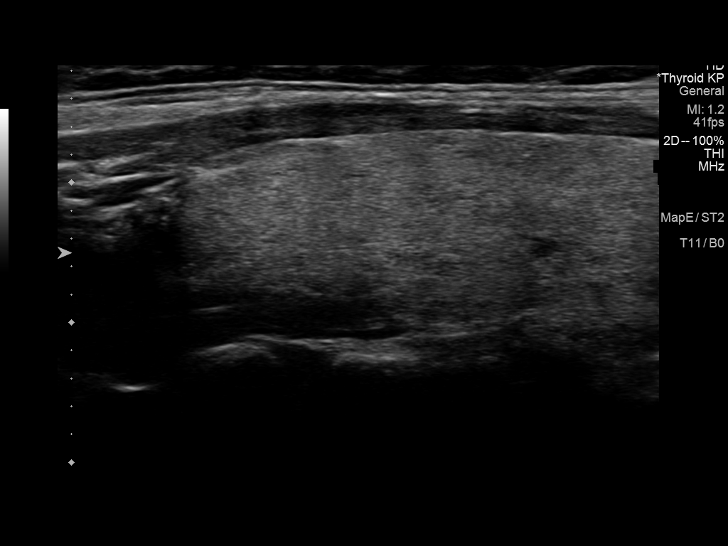
[im 38/46]
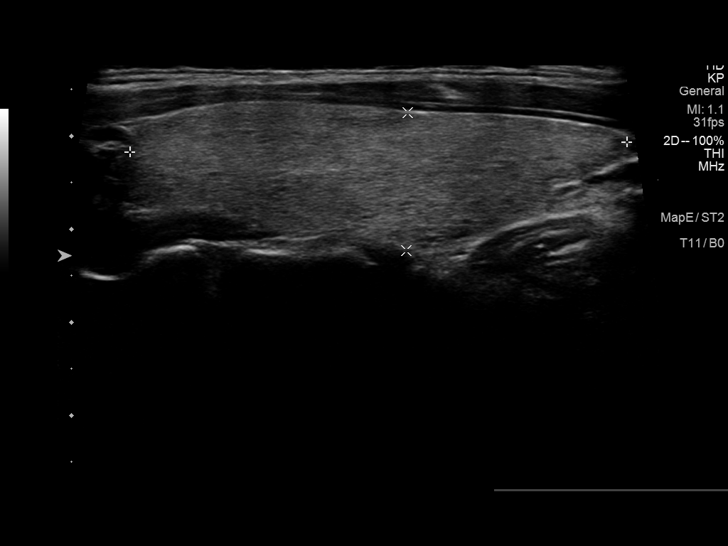
[im 42/46]
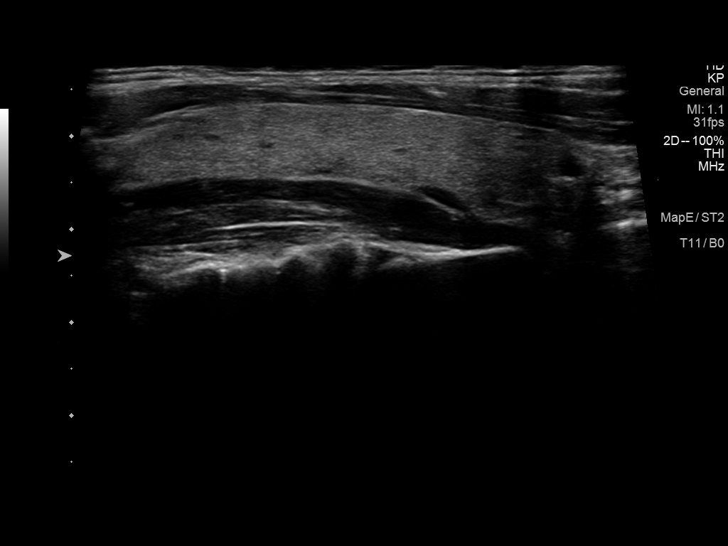
[im 46/46]
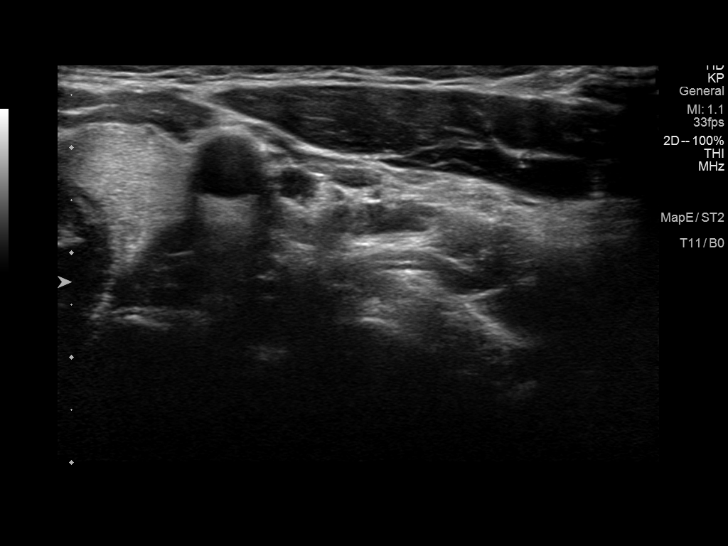

[14 of 25 positions shown; findings below may reference images not displayed]

FINDINGS: Parenchymal Echotexture: Moderately heterogenous - no definitive
glandular hyperemia.

Isthmus: Normal in size measures 0.3 cm in diameter

Right lobe: Borderline enlarged measuring 4.8 x 1.6 x 2.0 cm

Left lobe: Borderline enlarged measuring 5.3 x 1.5 x 2.0 cm

_________________________________________________________

Estimated total number of nodules >/= 1 cm: 0

Number of spongiform nodules >/=  2 cm not described below (TR1): 0

Number of mixed cystic and solid nodules >/= 1.5 cm not described
below (TR2): 0

_________________________________________________________

No discrete nodules are seen within the thyroid gland.
IMPRESSION: Borderline enlarged and moderately heterogeneous appearing thyroid
gland without discrete nodule or mass. Findings are nonspecific
though could be seen in the setting of a thyroiditis. Clinical
correlation is advised.

## 2021-04-15 ENCOUNTER — Encounter: Payer: Self-pay | Admitting: Family Medicine

## 2021-04-15 ENCOUNTER — Ambulatory Visit: Payer: 59 | Admitting: Family Medicine

## 2021-04-15 ENCOUNTER — Other Ambulatory Visit (HOSPITAL_COMMUNITY)
Admission: RE | Admit: 2021-04-15 | Discharge: 2021-04-15 | Disposition: A | Discharge status: Home / Self Care | Payer: 59 | Source: Ambulatory Visit | Attending: Family Medicine | Admitting: Family Medicine

## 2021-04-15 VITALS — BP 110/75 | HR 93 | Temp 98.6°F | Ht 66.0 in | Wt 178.8 lb

## 2021-04-15 DIAGNOSIS — B3731 Acute candidiasis of vulva and vagina: Secondary | ICD-10-CM | POA: Diagnosis not present

## 2021-04-15 DIAGNOSIS — N898 Other specified noninflammatory disorders of vagina: Secondary | ICD-10-CM

## 2021-04-15 DIAGNOSIS — R8290 Unspecified abnormal findings in urine: Secondary | ICD-10-CM | POA: Diagnosis not present

## 2021-04-15 LAB — WET PREP FOR TRICH, YEAST, CLUE
Clue Cell Exam: NEGATIVE
Trichomonas Exam: NEGATIVE
Yeast Exam: POSITIVE — AB

## 2021-04-15 LAB — URINALYSIS, ROUTINE W REFLEX MICROSCOPIC
Bilirubin, UA: NEGATIVE
Glucose, UA: NEGATIVE
Ketones, UA: NEGATIVE
Leukocytes,UA: NEGATIVE
Nitrite, UA: NEGATIVE
Protein,UA: NEGATIVE
RBC, UA: NEGATIVE
Specific Gravity, UA: <1.005 — ABNORMAL LOW (ref 1.005–1.030)
Urobilinogen, Ur: 0.2 mg/dL (ref 0.2–1.0)
pH, UA: 5 (ref 5.0–7.5)

## 2021-04-15 MED ORDER — CLOTRIMAZOLE 1 % VA CREA: 1.0000 | TOPICAL_CREAM | Freq: Every day | VAGINAL | 0 refills | Status: AC

## 2021-04-15 NOTE — Progress Notes (Signed)
Assessment & Plan:  1. Foul smelling urine - Urinalysis, Routine w reflex microscopic (negative)  2. Vaginal discharge Wet prep positive for yeast which is being treated. Also testing for gonorrhea and chlamydia due to many WBC and bacteria. Advised she call her OBGYN and make them aware of signs of infection on her wet prep. - WET PREP FOR TRICH, YEAST, CLUE - GC/Chlamydia probe amp ()not at [facility]  3. Vaginal yeast infection - clotrimazole (GYNE-LOTRIMIN) 1 % vaginal cream; Place 1 Applicatorful vaginally at bedtime for 7 days.  Dispense: 45 g; Refill: 0   Follow up plan: Return if symptoms worsen or fail to improve.  Deliah Boston, MSN, APRN, FNP-C [facility]  Subjective:   Patient ID: Erin Goodwin, female    DOB: 07-05-1995, 26 y.o.   MRN: [id number]  HPI: Erin Goodwin is a 26 y.o. female presenting on 04/15/2021 for Dysuria (When urinating you smell gas think it might be a possible fistula./Having this smell for a couple weeks)  Patient reports for the past couple of weeks every time she urinates she smells flatulence. No visualization of stool. It is really yellow when she wipes. She does have discharge when she wipes. She is currently [redacted] weeks pregnant. She is worried about a fistula.    ROS: Negative unless specifically indicated above in HPI.   Relevant past medical history reviewed and updated as indicated.   Allergies and medications reviewed and updated.   Current Outpatient Medications:    aspirin EC 81 MG tablet, Take 81 mg by mouth daily. Swallow whole., Disp: , Rfl:    calcium carbonate (TUMS - DOSED IN MG ELEMENTAL CALCIUM) 500 MG chewable tablet, Chew 1 tablet by mouth daily as needed for indigestion or heartburn., Disp: , Rfl:    estradiol (ESTRACE) 2 MG tablet, Take 2 mg by mouth 2 (two) times daily., Disp: , Rfl:    estradiol (VIVELLE-DOT) 0.1 MG/24HR patch, 1 patch every 3 (three) days., Disp: ,  Rfl:    OVER THE COUNTER MEDICATION, Vit D, Disp: , Rfl:    OVER THE COUNTER MEDICATION, Vit E, Disp: , Rfl:    prenatal vitamin w/FE, FA (PRENATAL 1 + 1) 27-1 MG TABS tablet, Take 1 tablet by mouth daily at 12 noon., Disp: , Rfl:   No Known Allergies  Objective:   BP 110/75    Pulse 93    Temp 98.6 F (37 C) (Temporal)    Ht 5\' 6"  (1.676 m)    Wt 178 lb 12.8 oz (81.1 kg)    SpO2 97%    BMI 28.86 kg/m    Physical Exam Vitals reviewed. Exam conducted with a chaperone present.  Constitutional:      General: She is not in acute distress.    Appearance: Normal appearance. She is not ill-appearing, toxic-appearing or diaphoretic.  HENT:     Head: Normocephalic and atraumatic.  Eyes:     General: No scleral icterus.       Right eye: No discharge.        Left eye: No discharge.     Conjunctiva/sclera: Conjunctivae normal.  Cardiovascular:     Rate and Rhythm: Normal rate.  Pulmonary:     Effort: Pulmonary effort is normal. No respiratory distress.  Genitourinary:    Pubic Area: No rash or pubic lice.      Labia:        Right: No rash, tenderness, lesion or injury.  Left: No rash, tenderness, lesion or injury.      Vagina: No signs of injury and foreign body. Vaginal discharge (thin white) present. No erythema, tenderness, bleeding or lesions.  Musculoskeletal:        General: Normal range of motion.     Cervical back: Normal range of motion.  Skin:    General: Skin is warm and dry.     Capillary Refill: Capillary refill takes less than 2 seconds.  Neurological:     General: No focal deficit present.     Mental Status: She is alert and oriented to person, place, and time. Mental status is at baseline.  Psychiatric:        Mood and Affect: Mood normal.        Behavior: Behavior normal.        Thought Content: Thought content normal.        Judgment: Judgment normal.

## 2021-04-17 LAB — GC/CHLAMYDIA PROBE AMP (~~LOC~~) NOT AT ARMC
Chlamydia: NEGATIVE
Comment: NEGATIVE
Comment: NORMAL
Neisseria Gonorrhea: NEGATIVE

## 2021-05-20 LAB — OB RESULTS CONSOLE HEPATITIS B SURFACE ANTIGEN: Hepatitis B Surface Ag: NEGATIVE

## 2021-05-20 LAB — OB RESULTS CONSOLE RUBELLA ANTIBODY, IGM: Rubella: IMMUNE

## 2021-05-20 LAB — OB RESULTS CONSOLE HIV ANTIBODY (ROUTINE TESTING): HIV: NONREACTIVE

## 2021-05-20 LAB — OB RESULTS CONSOLE RPR: RPR: NONREACTIVE

## 2021-06-10 LAB — OB RESULTS CONSOLE GC/CHLAMYDIA
Chlamydia: NEGATIVE
Neisseria Gonorrhea: NEGATIVE
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
## 2021-11-11 DIAGNOSIS — Z3A36 36 weeks gestation of pregnancy: Secondary | ICD-10-CM | POA: Diagnosis not present

## 2021-11-11 DIAGNOSIS — O26843 Uterine size-date discrepancy, third trimester: Secondary | ICD-10-CM | POA: Diagnosis not present

## 2021-11-11 DIAGNOSIS — Z333 Pregnant state, gestational carrier: Secondary | ICD-10-CM | POA: Diagnosis not present

## 2021-11-11 DIAGNOSIS — Z3480 Encounter for supervision of other normal pregnancy, unspecified trimester: Secondary | ICD-10-CM | POA: Diagnosis not present

## 2021-11-11 LAB — OB RESULTS CONSOLE GBS: GBS: NEGATIVE

## 2021-11-13 ENCOUNTER — Other Ambulatory Visit: Payer: Self-pay | Admitting: Obstetrics and Gynecology

## 2021-11-18 DIAGNOSIS — Z333 Pregnant state, gestational carrier: Secondary | ICD-10-CM | POA: Diagnosis not present

## 2021-11-18 DIAGNOSIS — O26843 Uterine size-date discrepancy, third trimester: Secondary | ICD-10-CM | POA: Diagnosis not present

## 2021-11-18 DIAGNOSIS — Z3A36 36 weeks gestation of pregnancy: Secondary | ICD-10-CM | POA: Diagnosis not present

## 2021-11-26 DIAGNOSIS — Z333 Pregnant state, gestational carrier: Secondary | ICD-10-CM | POA: Diagnosis not present

## 2021-11-26 DIAGNOSIS — Z3A37 37 weeks gestation of pregnancy: Secondary | ICD-10-CM | POA: Diagnosis not present

## 2021-11-26 DIAGNOSIS — N898 Other specified noninflammatory disorders of vagina: Secondary | ICD-10-CM | POA: Diagnosis not present

## 2021-11-27 ENCOUNTER — Encounter (HOSPITAL_COMMUNITY): Payer: Self-pay | Admitting: *Deleted

## 2021-11-27 ENCOUNTER — Encounter (HOSPITAL_COMMUNITY): Payer: Self-pay

## 2021-11-27 NOTE — Patient Instructions (Signed)
Erin Goodwin  11/27/2021   Your procedure is scheduled on:  12/08/2021  Arrive at 59 at Entrance C on [address] at [facility]  and [employer]. You are invited to use the FREE valet parking or use the Visitor's parking deck.  Pick up the phone at the desk and dial [phone].  Call this number if you have problems the morning of surgery: [phone]  Remember:   Do not eat food:(After Midnight) Desps de medianoche.  Do not drink clear liquids: (After Midnight) Desps de medianoche.  Take these medicines the morning of surgery with A SIP OF WATER:  none   Do not wear jewelry, make-up or nail polish.  Do not wear lotions, powders, or perfumes. Do not wear deodorant.  Do not shave 48 hours prior to surgery.  Do not bring valuables to the hospital.  [facility] is not   responsible for any belongings or valuables brought to the hospital.  Contacts, dentures or bridgework may not be worn into surgery.  Leave suitcase in the car. After surgery it may be brought to your room.  For patients admitted to the hospital, checkout time is 11:00 AM the day of              discharge.      Please read over the following fact sheets that you were given:     Preparing for Surgery

## 2021-12-01 DIAGNOSIS — Z3483 Encounter for supervision of other normal pregnancy, third trimester: Secondary | ICD-10-CM | POA: Diagnosis not present

## 2021-12-01 DIAGNOSIS — Z3A38 38 weeks gestation of pregnancy: Secondary | ICD-10-CM | POA: Diagnosis not present

## 2021-12-01 DIAGNOSIS — Z333 Pregnant state, gestational carrier: Secondary | ICD-10-CM | POA: Diagnosis not present

## 2021-12-01 DIAGNOSIS — O09523 Supervision of elderly multigravida, third trimester: Secondary | ICD-10-CM | POA: Diagnosis not present

## 2021-12-04 NOTE — Patient Instructions (Signed)
Erin Goodwin  12/04/2021   Your procedure is scheduled on:  12/08/2021  Arrive at 67 at Entrance C on [address] at [facility]  and [employer]. You are invited to use the FREE valet parking or use the Visitor's parking deck.  Pick up the phone at the desk and dial [phone].  Call this number if you have problems the morning of surgery: [phone]  Remember:   Do not eat food:(After Midnight) Desps de medianoche.  Do not drink clear liquids: (4 Hours before arrival) 4 horas ante llegada.  Take these medicines the morning of surgery with A SIP OF WATER:  none   Do not wear jewelry, make-up or nail polish.  Do not wear lotions, powders, or perfumes. Do not wear deodorant.  Do not shave 48 hours prior to surgery.  Do not bring valuables to the hospital.  [facility] is not   responsible for any belongings or valuables brought to the hospital.  Contacts, dentures or bridgework may not be worn into surgery.  Leave suitcase in the car. After surgery it may be brought to your room.  For patients admitted to the hospital, checkout time is 11:00 AM the day of              discharge.      Please read over the following fact sheets that you were given:     Preparing for Surgery

## 2021-12-05 ENCOUNTER — Encounter (HOSPITAL_COMMUNITY)
Admission: RE | Admit: 2021-12-05 | Discharge: 2021-12-05 | Disposition: A | Discharge status: Home / Self Care | Payer: 59 | Source: Ambulatory Visit | Attending: Obstetrics and Gynecology | Admitting: Obstetrics and Gynecology

## 2021-12-05 DIAGNOSIS — Z01812 Encounter for preprocedural laboratory examination: Secondary | ICD-10-CM | POA: Insufficient documentation

## 2021-12-05 LAB — TYPE AND SCREEN
ABO/RH(D): A POS
Antibody Screen: NEGATIVE

## 2021-12-05 LAB — CBC
HCT: 35.4 % — ABNORMAL LOW (ref 36.0–46.0)
Hemoglobin: 11 g/dL — ABNORMAL LOW (ref 12.0–15.0)
MCH: 27.7 pg (ref 26.0–34.0)
MCHC: 31.1 g/dL (ref 30.0–36.0)
MCV: 89.2 fL (ref 80.0–100.0)
Platelets: 481 10*3/uL — ABNORMAL HIGH (ref 150–400)
RBC: 3.97 MIL/uL (ref 3.87–5.11)
RDW: 14 % (ref 11.5–15.5)
WBC: 14.5 10*3/uL — ABNORMAL HIGH (ref 4.0–10.5)
nRBC: 0.1 % (ref 0.0–0.2)

## 2021-12-05 LAB — RPR: RPR Ser Ql: NONREACTIVE

## 2021-12-07 ENCOUNTER — Encounter (HOSPITAL_COMMUNITY): Payer: Self-pay | Admitting: Obstetrics and Gynecology

## 2021-12-07 NOTE — H&P (Signed)
Erin Goodwin is a 26 y.o. female presenting for rpt csection. OB History     Gravida  3   Para  2   Term  2   Preterm      AB      Living  2      SAB      IAB      Ectopic      Multiple  0   Live Births  2          Past Medical History:  Diagnosis Date   Chronic constipation    Chronic posterior anal fissure    External hemorrhoids    Family history of early CAD    Family history of headaches    GERD (gastroesophageal reflux disease)    Hirsutism    History of chicken pox    Hypotension    Migraines    Palpitations    PCOS (polycystic ovarian syndrome)    Past Surgical History:  Procedure Laterality Date   CESAREAN SECTION N/A 06/01/2017   Procedure: CESAREAN SECTION;  Surgeon: Brien Few, MD;  Location: [facility];  Service: Obstetrics;  Laterality: N/A;   CESAREAN SECTION N/A 09/19/2019   Procedure: Repeat CESAREAN SECTION;  Surgeon: Brien Few, MD;  Location: [facility];  Service: Obstetrics;  Laterality: N/A;  EDD: 09/26/19 Tracey RNFA   TONSILLECTOMY AND ADENOIDECTOMY     WISDOM TOOTH EXTRACTION     Family History: family history includes ADD / ADHD in her brother; Breast cancer in her maternal grandmother; COPD in her mother; Cancer (age of onset: 75) in her maternal aunt and mother; Diabetes in her maternal grandfather and mother; Heart attack in her father; Heart attack (age of onset: 18) in her mother; Hyperlipidemia in her mother; Irritable bowel syndrome in her mother. Social History:  reports that she has never smoked. She has never used smokeless tobacco. She reports current alcohol use. She reports that she does not use drugs.     Maternal Diabetes: No Genetic Screening: Normal Maternal Ultrasounds/Referrals: Normal Fetal Ultrasounds or other Referrals:  None Maternal Substance Abuse:  No Significant Maternal Medications:  None Significant Maternal Lab Results:  Group B Strep negative Number of Prenatal  Visits:greater than 3 verified prenatal visits Other Comments:  None  Review of Systems  Constitutional: Negative.   All other systems reviewed and are negative.  Maternal Medical History:  Reason for admission: Contractions.   Contractions: Frequency: rare.   Perceived severity is mild.   Fetal activity: Perceived fetal activity is normal.   Prenatal complications: no prenatal complications Prenatal Complications - Diabetes: none.     unknown if currently breastfeeding. Maternal Exam:  Uterine Assessment: Contraction strength is mild.  Contraction frequency is rare.  Abdomen: Patient reports no abdominal tenderness. Surgical scars: low transverse.   Fetal presentation: vertex Introitus: Normal vulva. Normal vagina.  Ferning test: not done.  Nitrazine test: not done. Pelvis: questionable for delivery.   Cervix: Cervix evaluated by digital exam.     Physical Exam Constitutional:      Appearance: Normal appearance. She is normal weight.  HENT:     Head: Normocephalic and atraumatic.  Cardiovascular:     Rate and Rhythm: Normal rate and regular rhythm.     Pulses: Normal pulses.     Heart sounds: Normal heart sounds.  Pulmonary:     Effort: Pulmonary effort is normal.     Breath sounds: Normal breath sounds.  Abdominal:  General: Bowel sounds are normal.     Palpations: Abdomen is soft.  Genitourinary:    General: Normal vulva.  Musculoskeletal:        General: Normal range of motion.     Cervical back: Normal range of motion and neck supple.  Skin:    General: Skin is warm and dry.  Neurological:     General: No focal deficit present.     Mental Status: She is alert and oriented to person, place, and time.  Psychiatric:        Mood and Affect: Mood normal.        Behavior: Behavior normal.     Prenatal labs: ABO, Rh: --/--/A POS (10/06 1054) Antibody: NEG (10/06 1054) Rubella: Immune (03/21 0000) RPR: NON REACTIVE (10/06 1053)  HBsAg: Negative (03/21  0000)  HIV: Non-reactive (03/21 0000)  GBS: Negative/-- (09/12 0000)   Assessment/Plan: 39 wk IUP Previous csection x 2. Rpt csection.  Surgical risks discussed. Consent done.   Joan Avetisyan J 12/07/2021, 9:31 AM

## 2021-12-08 ENCOUNTER — Other Ambulatory Visit: Payer: Self-pay

## 2021-12-08 ENCOUNTER — Inpatient Hospital Stay (HOSPITAL_COMMUNITY)
Admission: AD | Admit: 2021-12-08 | Discharge: 2021-12-10 | DRG: 788 | Disposition: A | Discharge status: Home / Self Care | Payer: 59 | Attending: Obstetrics and Gynecology | Admitting: Obstetrics and Gynecology

## 2021-12-08 ENCOUNTER — Inpatient Hospital Stay (HOSPITAL_COMMUNITY): Payer: 59 | Admitting: Anesthesiology

## 2021-12-08 ENCOUNTER — Encounter (HOSPITAL_COMMUNITY)
Admission: AD | Disposition: A | Discharge status: Home / Self Care | Payer: Self-pay | Source: Home / Self Care | Attending: Obstetrics and Gynecology

## 2021-12-08 ENCOUNTER — Encounter (HOSPITAL_COMMUNITY): Payer: Self-pay | Admitting: Obstetrics and Gynecology

## 2021-12-08 DIAGNOSIS — Z333 Pregnant state, gestational carrier: Secondary | ICD-10-CM

## 2021-12-08 DIAGNOSIS — Z3A39 39 weeks gestation of pregnancy: Secondary | ICD-10-CM

## 2021-12-08 DIAGNOSIS — O9902 Anemia complicating childbirth: Secondary | ICD-10-CM | POA: Diagnosis present

## 2021-12-08 DIAGNOSIS — D509 Iron deficiency anemia, unspecified: Secondary | ICD-10-CM | POA: Diagnosis not present

## 2021-12-08 DIAGNOSIS — O34211 Maternal care for low transverse scar from previous cesarean delivery: Secondary | ICD-10-CM | POA: Diagnosis not present

## 2021-12-08 DIAGNOSIS — O34219 Maternal care for unspecified type scar from previous cesarean delivery: Secondary | ICD-10-CM | POA: Diagnosis present

## 2021-12-08 DIAGNOSIS — Z98891 History of uterine scar from previous surgery: Secondary | ICD-10-CM

## 2021-12-08 DIAGNOSIS — O99214 Obesity complicating childbirth: Secondary | ICD-10-CM | POA: Diagnosis present

## 2021-12-08 SURGERY — Surgical Case
Anesthesia: Spinal

## 2021-12-08 MED ORDER — PHENYLEPHRINE HCL-NACL 20-0.9 MG/250ML-% IV SOLN
INTRAVENOUS | Status: DC | PRN
  Administered 2021-12-08: 60 ug/min via INTRAVENOUS

## 2021-12-08 MED ORDER — DIPHENHYDRAMINE HCL 25 MG PO CAPS: 25.0000 mg | ORAL_CAPSULE | Freq: Four times a day (QID) | ORAL | Status: DC | PRN

## 2021-12-08 MED ORDER — SCOPOLAMINE 1 MG/3DAYS TD PT72
MEDICATED_PATCH | TRANSDERMAL | Status: AC
  Filled 2021-12-08: qty 1

## 2021-12-08 MED ORDER — LACTATED RINGERS IV SOLN: INTRAVENOUS | Status: DC

## 2021-12-08 MED ORDER — BUPIVACAINE IN DEXTROSE 0.75-8.25 % IT SOLN
INTRATHECAL | Status: DC | PRN
  Administered 2021-12-08: 1.6 mL via INTRATHECAL

## 2021-12-08 MED ORDER — POVIDONE-IODINE 10 % EX SWAB
2.0000 | Freq: Once | CUTANEOUS | Status: AC
  Administered 2021-12-08: 2 via TOPICAL

## 2021-12-08 MED ORDER — OXYCODONE HCL 5 MG PO TABS
5.0000 mg | ORAL_TABLET | ORAL | Status: DC | PRN
  Administered 2021-12-09: 5 mg via ORAL
  Administered 2021-12-09 – 2021-12-10 (×2): 10 mg via ORAL
  Filled 2021-12-08: qty 1
  Filled 2021-12-08 (×2): qty 2

## 2021-12-08 MED ORDER — DIBUCAINE (PERIANAL) 1 % EX OINT: 1.0000 | TOPICAL_OINTMENT | CUTANEOUS | Status: DC | PRN

## 2021-12-08 MED ORDER — METHYLERGONOVINE MALEATE 0.2 MG PO TABS: 0.2000 mg | ORAL_TABLET | ORAL | Status: DC | PRN

## 2021-12-08 MED ORDER — KETOROLAC TROMETHAMINE 30 MG/ML IJ SOLN
INTRAMUSCULAR | Status: AC
  Filled 2021-12-08: qty 1

## 2021-12-08 MED ORDER — SIMETHICONE 80 MG PO CHEW
80.0000 mg | CHEWABLE_TABLET | ORAL | Status: DC | PRN
  Administered 2021-12-08 – 2021-12-10 (×2): 80 mg via ORAL

## 2021-12-08 MED ORDER — IBUPROFEN 600 MG PO TABS
600.0000 mg | ORAL_TABLET | Freq: Four times a day (QID) | ORAL | Status: DC
  Administered 2021-12-09 – 2021-12-10 (×3): 600 mg via ORAL
  Filled 2021-12-08 (×3): qty 1

## 2021-12-08 MED ORDER — ONDANSETRON HCL 4 MG/2ML IJ SOLN
INTRAMUSCULAR | Status: DC | PRN
  Administered 2021-12-08: 4 mg via INTRAVENOUS

## 2021-12-08 MED ORDER — FENTANYL CITRATE (PF) 100 MCG/2ML IJ SOLN
INTRAMUSCULAR | Status: AC
  Filled 2021-12-08: qty 2

## 2021-12-08 MED ORDER — STERILE WATER FOR IRRIGATION IR SOLN
Status: DC | PRN
  Administered 2021-12-08: 1

## 2021-12-08 MED ORDER — MEPERIDINE HCL 25 MG/ML IJ SOLN: 6.2500 mg | INTRAMUSCULAR | Status: DC | PRN

## 2021-12-08 MED ORDER — NALOXONE HCL 4 MG/10ML IJ SOLN: 1.0000 ug/kg/h | INTRAVENOUS | Status: DC | PRN

## 2021-12-08 MED ORDER — FENTANYL CITRATE (PF) 100 MCG/2ML IJ SOLN
INTRAMUSCULAR | Status: DC | PRN
  Administered 2021-12-08: 15 ug via INTRATHECAL

## 2021-12-08 MED ORDER — NALOXONE HCL 0.4 MG/ML IJ SOLN: 0.4000 mg | INTRAMUSCULAR | Status: DC | PRN

## 2021-12-08 MED ORDER — ACETAMINOPHEN 500 MG PO TABS
1000.0000 mg | ORAL_TABLET | Freq: Four times a day (QID) | ORAL | Status: DC
  Administered 2021-12-08 – 2021-12-10 (×6): 1000 mg via ORAL
  Filled 2021-12-08 (×6): qty 2

## 2021-12-08 MED ORDER — COCONUT OIL OIL: 1.0000 | TOPICAL_OIL | Status: DC | PRN

## 2021-12-08 MED ORDER — DEXAMETHASONE SODIUM PHOSPHATE 10 MG/ML IJ SOLN
INTRAMUSCULAR | Status: DC | PRN
  Administered 2021-12-08: 8 mg via INTRAVENOUS

## 2021-12-08 MED ORDER — OXYTOCIN-SODIUM CHLORIDE 30-0.9 UT/500ML-% IV SOLN: 2.5000 [IU]/h | INTRAVENOUS | Status: AC

## 2021-12-08 MED ORDER — SIMETHICONE 80 MG PO CHEW
80.0000 mg | CHEWABLE_TABLET | Freq: Three times a day (TID) | ORAL | Status: DC
  Administered 2021-12-09 (×2): 80 mg via ORAL
  Filled 2021-12-08 (×4): qty 1

## 2021-12-08 MED ORDER — OXYTOCIN-SODIUM CHLORIDE 30-0.9 UT/500ML-% IV SOLN
INTRAVENOUS | Status: DC | PRN
  Administered 2021-12-08: 300 mL via INTRAVENOUS

## 2021-12-08 MED ORDER — OXYCODONE HCL 5 MG/5ML PO SOLN: 5.0000 mg | Freq: Once | ORAL | Status: DC | PRN

## 2021-12-08 MED ORDER — MORPHINE SULFATE (PF) 0.5 MG/ML IJ SOLN
INTRAMUSCULAR | Status: AC
  Filled 2021-12-08: qty 10

## 2021-12-08 MED ORDER — PRENATAL MULTIVITAMIN CH
1.0000 | ORAL_TABLET | Freq: Every day | ORAL | Status: DC
  Administered 2021-12-09 – 2021-12-10 (×2): 1 via ORAL
  Filled 2021-12-08 (×2): qty 1

## 2021-12-08 MED ORDER — HYDROMORPHONE HCL 1 MG/ML IJ SOLN: 0.2500 mg | INTRAMUSCULAR | Status: DC | PRN

## 2021-12-08 MED ORDER — DEXAMETHASONE SODIUM PHOSPHATE 4 MG/ML IJ SOLN
INTRAMUSCULAR | Status: AC
  Filled 2021-12-08: qty 2

## 2021-12-08 MED ORDER — SODIUM CHLORIDE 0.9 % IR SOLN
Status: DC | PRN
  Administered 2021-12-08: 1

## 2021-12-08 MED ORDER — PROMETHAZINE HCL 25 MG/ML IJ SOLN: 6.2500 mg | INTRAMUSCULAR | Status: DC | PRN

## 2021-12-08 MED ORDER — BUPIVACAINE HCL (PF) 0.25 % IJ SOLN
INTRAMUSCULAR | Status: AC
  Filled 2021-12-08: qty 20

## 2021-12-08 MED ORDER — CEFAZOLIN SODIUM-DEXTROSE 2-4 GM/100ML-% IV SOLN
INTRAVENOUS | Status: AC
  Filled 2021-12-08: qty 100

## 2021-12-08 MED ORDER — KETOROLAC TROMETHAMINE 30 MG/ML IJ SOLN
30.0000 mg | Freq: Once | INTRAMUSCULAR | Status: AC | PRN
  Administered 2021-12-08: 30 mg via INTRAVENOUS

## 2021-12-08 MED ORDER — TETANUS-DIPHTH-ACELL PERTUSSIS 5-2.5-18.5 LF-MCG/0.5 IM SUSY: 0.5000 mL | PREFILLED_SYRINGE | Freq: Once | INTRAMUSCULAR | Status: DC

## 2021-12-08 MED ORDER — METHYLERGONOVINE MALEATE 0.2 MG/ML IJ SOLN: 0.2000 mg | INTRAMUSCULAR | Status: DC | PRN

## 2021-12-08 MED ORDER — TRANEXAMIC ACID-NACL 1000-0.7 MG/100ML-% IV SOLN
INTRAVENOUS | Status: AC
  Filled 2021-12-08: qty 100

## 2021-12-08 MED ORDER — ONDANSETRON HCL 4 MG/2ML IJ SOLN
INTRAMUSCULAR | Status: AC
  Filled 2021-12-08: qty 2

## 2021-12-08 MED ORDER — TRANEXAMIC ACID-NACL 1000-0.7 MG/100ML-% IV SOLN
INTRAVENOUS | Status: DC | PRN
  Administered 2021-12-08: 1000 mg via INTRAVENOUS

## 2021-12-08 MED ORDER — SODIUM CHLORIDE 0.9% FLUSH: 3.0000 mL | INTRAVENOUS | Status: DC | PRN

## 2021-12-08 MED ORDER — DIPHENHYDRAMINE HCL 50 MG/ML IJ SOLN: 12.5000 mg | INTRAMUSCULAR | Status: DC | PRN

## 2021-12-08 MED ORDER — MENTHOL 3 MG MT LOZG: 1.0000 | LOZENGE | OROMUCOSAL | Status: DC | PRN

## 2021-12-08 MED ORDER — KETOROLAC TROMETHAMINE 30 MG/ML IJ SOLN
30.0000 mg | Freq: Four times a day (QID) | INTRAMUSCULAR | Status: AC
  Administered 2021-12-08 – 2021-12-09 (×2): 30 mg via INTRAVENOUS
  Filled 2021-12-08 (×3): qty 1

## 2021-12-08 MED ORDER — BUPIVACAINE HCL (PF) 0.25 % IJ SOLN
INTRAMUSCULAR | Status: DC | PRN
  Administered 2021-12-08: 20 mL

## 2021-12-08 MED ORDER — CEFAZOLIN SODIUM-DEXTROSE 2-4 GM/100ML-% IV SOLN
2.0000 g | INTRAVENOUS | Status: AC
  Administered 2021-12-08: 2 g via INTRAVENOUS

## 2021-12-08 MED ORDER — WITCH HAZEL-GLYCERIN EX PADS: 1.0000 | MEDICATED_PAD | CUTANEOUS | Status: DC | PRN

## 2021-12-08 MED ORDER — SENNOSIDES-DOCUSATE SODIUM 8.6-50 MG PO TABS
2.0000 | ORAL_TABLET | Freq: Every day | ORAL | Status: DC
  Administered 2021-12-09 – 2021-12-10 (×2): 2 via ORAL
  Filled 2021-12-08 (×2): qty 2

## 2021-12-08 MED ORDER — OXYCODONE HCL 5 MG PO TABS: 5.0000 mg | ORAL_TABLET | Freq: Once | ORAL | Status: DC | PRN

## 2021-12-08 MED ORDER — ZOLPIDEM TARTRATE 5 MG PO TABS: 5.0000 mg | ORAL_TABLET | Freq: Every evening | ORAL | Status: DC | PRN

## 2021-12-08 MED ORDER — DIPHENHYDRAMINE HCL 25 MG PO CAPS: 25.0000 mg | ORAL_CAPSULE | ORAL | Status: DC | PRN

## 2021-12-08 MED ORDER — MORPHINE SULFATE (PF) 0.5 MG/ML IJ SOLN
INTRAMUSCULAR | Status: DC | PRN
  Administered 2021-12-08: 150 ug via INTRATHECAL

## 2021-12-08 SURGICAL SUPPLY — 36 items
BENZOIN TINCTURE PRP APPL 2/3 (GAUZE/BANDAGES/DRESSINGS) IMPLANT
CHLORAPREP W/TINT 26ML (MISCELLANEOUS) ×2 IMPLANT
CLAMP UMBILICAL CORD (MISCELLANEOUS) ×1 IMPLANT
CLOTH BEACON ORANGE TIMEOUT ST (SAFETY) ×1 IMPLANT
DRSG OPSITE POSTOP 4X10 (GAUZE/BANDAGES/DRESSINGS) ×1 IMPLANT
ELECT REM PT RETURN 9FT ADLT (ELECTROSURGICAL) ×1
ELECTRODE REM PT RTRN 9FT ADLT (ELECTROSURGICAL) ×1 IMPLANT
EXTRACTOR VACUUM KIWI (MISCELLANEOUS) IMPLANT
GAUZE SPONGE 4X4 12PLY STRL LF (GAUZE/BANDAGES/DRESSINGS) IMPLANT
GLOVE BIOGEL PI IND STRL 7.0 (GLOVE) ×1 IMPLANT
GLOVE SURG LTX SZ8 (GLOVE) ×1 IMPLANT
GOWN STRL REUS W/TWL LRG LVL3 (GOWN DISPOSABLE) ×2 IMPLANT
KIT ABG SYR 3ML LUER SLIP (SYRINGE) IMPLANT
NDL HYPO 25X5/8 SAFETYGLIDE (NEEDLE) IMPLANT
NDL SPNL 20GX3.5 QUINCKE YW (NEEDLE) IMPLANT
NEEDLE HYPO 22GX1.5 SAFETY (NEEDLE) ×1 IMPLANT
NEEDLE HYPO 25X5/8 SAFETYGLIDE (NEEDLE) IMPLANT
NEEDLE SPNL 20GX3.5 QUINCKE YW (NEEDLE) IMPLANT
NS IRRIG 1000ML POUR BTL (IV SOLUTION) ×1 IMPLANT
PACK C SECTION WH (CUSTOM PROCEDURE TRAY) ×1 IMPLANT
PAD ABD 7.5X8 STRL (GAUZE/BANDAGES/DRESSINGS) IMPLANT
STRIP CLOSURE SKIN 1/2X4 (GAUZE/BANDAGES/DRESSINGS) IMPLANT
SUT MNCRL 0 VIOLET CTX 36 (SUTURE) ×2 IMPLANT
SUT MNCRL AB 3-0 PS2 27 (SUTURE) IMPLANT
SUT MON AB 2-0 CT1 27 (SUTURE) ×1 IMPLANT
SUT MON AB-0 CT1 36 (SUTURE) ×2 IMPLANT
SUT MONOCRYL 0 CTX 36 (SUTURE) ×2
SUT PLAIN 0 NONE (SUTURE) IMPLANT
SUT PLAIN 2 0 (SUTURE)
SUT PLAIN 2 0 XLH (SUTURE) IMPLANT
SUT PLAIN ABS 2-0 CT1 27XMFL (SUTURE) IMPLANT
SYR 20CC LL (SYRINGE) IMPLANT
SYR CONTROL 10ML LL (SYRINGE) ×1 IMPLANT
TOWEL OR 17X24 6PK STRL BLUE (TOWEL DISPOSABLE) ×1 IMPLANT
TRAY FOLEY W/BAG SLVR 14FR LF (SET/KITS/TRAYS/PACK) ×1 IMPLANT
WATER STERILE IRR 1000ML POUR (IV SOLUTION) ×1 IMPLANT

## 2021-12-08 NOTE — Anesthesia Preprocedure Evaluation (Signed)
Anesthesia Evaluation  Patient identified by MRN, date of birth, ID band Patient awake    Reviewed: Allergy & Precautions, H&P , NPO status , Patient's Chart, lab work & pertinent test results  Airway Mallampati: II  TM Distance: >3 FB Neck ROM: full    Dental no notable dental hx. (+) Teeth Intact, Dental Advisory Given   Pulmonary    Pulmonary exam normal breath sounds clear to auscultation       Cardiovascular negative cardio ROS Normal cardiovascular exam Rhythm:regular Rate:Normal     Neuro/Psych  Headaches,    GI/Hepatic Neg liver ROS, GERD  Medicated and Controlled,  Endo/Other  Obesity  Renal/GU negative Renal ROS  negative genitourinary   Musculoskeletal negative musculoskeletal ROS (+)   Abdominal (+) + obese,   Peds  Hematology negative hematology ROS (+)   Anesthesia Other Findings       Reproductive/Obstetrics (+) Pregnancy                             Anesthesia Physical  Anesthesia Plan  ASA: II  Anesthesia Plan: Spinal   Post-op Pain Management:    Induction:   PONV Risk Score and Plan: 2 and Treatment may vary due to age or medical condition and Ondansetron  Airway Management Planned: Natural Airway  Additional Equipment: None  Intra-op Plan:   Post-operative Plan:   Informed Consent: I have reviewed the patients History and Physical, chart, labs and discussed the procedure including the risks, benefits and alternatives for the proposed anesthesia with the patient or authorized representative who has indicated his/her understanding and acceptance.     Dental Advisory Given and Dental advisory given  Plan Discussed with: CRNA  Anesthesia Plan Comments:         Anesthesia Quick Evaluation

## 2021-12-08 NOTE — Transfer of Care (Signed)
Immediate Anesthesia Transfer of Care Note  Patient: Erin Goodwin  Procedure(s) Performed: Repeat CESAREAN SECTION  Patient Location: PACU  Anesthesia Type:Spinal  Level of Consciousness: awake, alert , and oriented  Airway & Oxygen Therapy: Patient Spontanous Breathing  Post-op Assessment: Report given to RN and Post -op Vital signs reviewed and stable  Post vital signs: Reviewed and stable  Last Vitals:  Vitals Value Taken Time  BP    Temp    Pulse 94 12/08/21 1541  Resp    SpO2 99 % 12/08/21 1541  Vitals shown include unvalidated device data.  Last Pain:  Vitals:   12/08/21 1236  TempSrc: Oral         Complications: No notable events documented.

## 2021-12-08 NOTE — Anesthesia Postprocedure Evaluation (Signed)
Anesthesia Post Note  Patient: Erin Goodwin  Procedure(s) Performed: Repeat CESAREAN SECTION     Patient location during evaluation: PACU Anesthesia Type: Spinal Level of consciousness: awake and alert Pain management: pain level controlled Vital Signs Assessment: post-procedure vital signs reviewed and stable Respiratory status: spontaneous breathing, nonlabored ventilation and respiratory function stable Cardiovascular status: blood pressure returned to baseline and stable Postop Assessment: no apparent nausea or vomiting Anesthetic complications: no   No notable events documented.  Last Vitals:  Vitals:   12/08/21 1645 12/08/21 1653  BP: 105/69 (!) 101/59  Pulse: 74   Resp: 14 18  Temp:  (!) 36.1 C  SpO2: 99% 100%    Last Pain:  Vitals:   12/08/21 1645  TempSrc:   PainSc: 1    Pain Goal:                   Lynda Rainwater

## 2021-12-08 NOTE — Anesthesia Procedure Notes (Signed)
Spinal  Patient location during procedure: OB Start time: 12/08/2021 2:41 PM End time: 12/08/2021 2:46 PM Reason for block: surgical anesthesia Staffing Performed: anesthesiologist  Anesthesiologist: Lynda Rainwater, MD Performed by: Lynda Rainwater, MD Authorized by: Lynda Rainwater, MD   Preanesthetic Checklist Completed: patient identified, IV checked, risks and benefits discussed, surgical consent, monitors and equipment checked, pre-op evaluation and timeout performed Spinal Block Patient position: sitting Prep: DuraPrep and site prepped and draped Patient monitoring: heart rate, cardiac monitor, continuous pulse ox and blood pressure Approach: midline Location: L3-4 Injection technique: single-shot Needle Needle type: Pencan  Needle gauge: 24 G Needle length: 10 cm Assessment Sensory level: T4 Events: CSF return

## 2021-12-08 NOTE — Op Note (Signed)
Cesarean Section Procedure Note  Indications: previous uterine incision kerr x2  Pre-operative Diagnosis: 39 week 2 day pregnancy.  Post-operative Diagnosis: same  Surgeon: Lovenia Kim   Assistants: Ronnald Ramp, cnm  Anesthesia: Local anesthesia 0.25.% bupivacaine and Spinal anesthesia  ASA Class: 2  Procedure Details  The patient was seen in the Holding Room. The risks, benefits, complications, treatment options, and expected outcomes were discussed with the patient.  The patient concurred with the proposed plan, giving informed consent. The risks of anesthesia, infection, bleeding and possible injury to other organs discussed. Injury to bowel, bladder, or ureter with possible need for repair discussed. Possible need for transfusion with secondary risks of hepatitis or HIV acquisition discussed. Post operative complications to include but not limited to DVT, PE and Pneumonia noted. The site of surgery properly noted/marked. The patient was taken to Operating Room # A, identified as Erin Goodwin and the procedure verified as C-Section Delivery. A Time Out was held and the above information confirmed.  After induction of anesthesia, the patient was draped and prepped in the usual sterile manner. A Pfannenstiel incision was made and carried down through the subcutaneous tissue to the fascia. Fascial incision was made and extended transversely using Mayo scissors. The fascia was separated from the underlying rectus tissue superiorly and inferiorly. The peritoneum was identified and entered. Peritoneal incision was extended longitudinally. The utero-vesical peritoneal reflection was incised transversely and the bladder flap was bluntly freed from the lower uterine segment. A low transverse uterine incision(Kerr hysterotomy) was made. Delivered from OA presentation was a  female3 with Apgar scores of 9 at one minute and 9 at five minutes. Bulb suctioning gently performed. Neonatal team in  attendance.After the umbilical cord was clamped and cut cord blood was obtained for evaluation. The placenta was removed intact and appeared normal. The uterus was curetted with a dry lap pack. Good hemostasis was noted.The uterine outline, tubes and ovaries appeared normal. The uterine incision was closed with running locked sutures of 0 Monocryl x 2 layers. Hemostasis was observed. The fascia was then reapproximated with running sutures of 0 Monocryl. The skin was reapproximated with 3-0 monocryl after Sumner closure with 2-0 plain  Instrument, sponge, and needle counts were correct prior the abdominal closure and at the conclusion of the case.   Findings: FTLF, post placenta. Nl adnexa.   Estimated Blood Loss:  300 mL         Drains: foley                 Specimens: placenta                 Complications:  None; patient tolerated the procedure well.         Disposition: PACU - hemodynamically stable.         Condition: stable  Attending Attestation: I performed the procedure.

## 2021-12-09 ENCOUNTER — Encounter (HOSPITAL_COMMUNITY): Payer: Self-pay | Admitting: Obstetrics and Gynecology

## 2021-12-09 DIAGNOSIS — D509 Iron deficiency anemia, unspecified: Secondary | ICD-10-CM | POA: Diagnosis present

## 2021-12-09 LAB — CBC
HCT: 23.2 % — ABNORMAL LOW (ref 36.0–46.0)
Hemoglobin: 7.6 g/dL — ABNORMAL LOW (ref 12.0–15.0)
MCH: 27.7 pg (ref 26.0–34.0)
MCHC: 32.8 g/dL (ref 30.0–36.0)
MCV: 84.7 fL (ref 80.0–100.0)
Platelets: 361 10*3/uL (ref 150–400)
RBC: 2.74 MIL/uL — ABNORMAL LOW (ref 3.87–5.11)
RDW: 14 % (ref 11.5–15.5)
WBC: 15.9 10*3/uL — ABNORMAL HIGH (ref 4.0–10.5)
nRBC: 0 % (ref 0.0–0.2)

## 2021-12-09 MED ORDER — MAGNESIUM OXIDE -MG SUPPLEMENT 400 (240 MG) MG PO TABS
400.0000 mg | ORAL_TABLET | Freq: Every day | ORAL | Status: DC
  Administered 2021-12-10: 400 mg via ORAL
  Filled 2021-12-09: qty 1

## 2021-12-09 MED ORDER — POLYSACCHARIDE IRON COMPLEX 150 MG PO CAPS
150.0000 mg | ORAL_CAPSULE | Freq: Every day | ORAL | Status: DC
  Administered 2021-12-10: 150 mg via ORAL
  Filled 2021-12-09: qty 1

## 2021-12-09 MED ORDER — SODIUM CHLORIDE 0.9 % IV SOLN
500.0000 mg | Freq: Once | INTRAVENOUS | Status: AC
  Administered 2021-12-09: 500 mg via INTRAVENOUS
  Filled 2021-12-09: qty 500

## 2021-12-09 NOTE — Progress Notes (Signed)
   Subjective: POD# 1 Live born female  Birth Weight: 8 lb 7.8 oz (3850 g) APGAR: 8, 8  Newborn Delivery   Birth date/time: 12/08/2021 15:06:00 Delivery type: C-Section, Low Transverse Trial of labor: No C-section categorization: Repeat     Baby name: Huel Cote (rooming in with bio parents)  Delivering provider: Brien Few   Feeding: pumping for baby  Pain control at delivery: Spinal   Reports feeling well with no complaints. Denies dizziness.   Patient reports tolerating PO.   Pain controlled with acetaminophen and prescription NSAID's including ketorolac (Toradol) Denies HA/SOB/C/P/N/V/dizziness. She reports vaginal bleeding as normal, without clots. She is ambulating and urinating without difficulty.     Objective:  Vitals:   12/08/21 2000 12/08/21 2100 12/09/21 0130 12/09/21 0505  BP: 106/64 112/60 (!) 103/54 (!) 90/53  Pulse: 83 72 60 68  Resp: 16 18 16 18   Temp: 98 F (36.7 C)  98.2 F (36.8 C)   TempSrc: Oral     SpO2: 100%   100%  Weight:      Height:        Intake/Output Summary (Last 24 hours) at 12/09/2021 0847 Last data filed at 12/09/2021 0130 Gross per 24 hour  Intake 1100 ml  Output 2865 ml  Net -1765 ml      Recent Labs    12/09/21 0603  WBC 15.9*  HGB 7.6*  HCT 23.2*  PLT 361    Blood type: --/--/A POS (10/06 1054)  Rubella: Immune (03/21 0000)   Physical Exam:  General: alert and cooperative CV: Regular rate and rhythm Resp: clear Abdomen: soft, nontender, normal bowel sounds Incision: clean, dry, and intact Uterine Fundus: firm, below umbilicus, nontender Lochia: minimal Ext: extremities normal, atraumatic, no cyanosis or edema  Assessment/Plan: 26 y.o.   POD# 1. [id number]                  Principal Problem:   Postpartum care following cesarean delivery 10/9  Encourage to ambulate Routine post-op care Active Problems:   Status post repeat low transverse cesarean section   Surrogate pregnancy  Baby not in room, rooming  in with bio parents   Previous cesarean delivery affecting pregnancy   Iron deficiency anemia of pregnancy  Hgb 7.6 this AM, down from 11  Asymptomatic  Will give IV Venofer today  Start PO Niferex and mag oxide tomorrow  Anticipate discharge tomorrow.   Suzan Nailer, CNM, MSN 12/09/2021, 8:47 AM

## 2021-12-10 LAB — BIRTH TISSUE RECOVERY COLLECTION (PLACENTA DONATION)

## 2021-12-10 MED ORDER — POLYSACCHARIDE IRON COMPLEX 150 MG PO CAPS: 150.0000 mg | ORAL_CAPSULE | Freq: Every day | ORAL | Status: DC

## 2021-12-10 MED ORDER — ACETAMINOPHEN 500 MG PO TABS: 1000.0000 mg | ORAL_TABLET | Freq: Four times a day (QID) | ORAL | 0 refills | Status: AC

## 2021-12-10 MED ORDER — SIMETHICONE 80 MG PO CHEW: 80.0000 mg | CHEWABLE_TABLET | ORAL | 0 refills | Status: DC | PRN

## 2021-12-10 MED ORDER — MAGNESIUM OXIDE -MG SUPPLEMENT 400 (240 MG) MG PO TABS: 400.0000 mg | ORAL_TABLET | Freq: Every day | ORAL | Status: DC

## 2021-12-10 MED ORDER — SENNOSIDES-DOCUSATE SODIUM 8.6-50 MG PO TABS: 2.0000 | ORAL_TABLET | Freq: Every day | ORAL | Status: DC

## 2021-12-10 MED ORDER — IBUPROFEN 600 MG PO TABS: 600.0000 mg | ORAL_TABLET | Freq: Four times a day (QID) | ORAL | 0 refills | Status: DC

## 2021-12-10 MED ORDER — COCONUT OIL OIL: 1.0000 | TOPICAL_OIL | 0 refills | Status: DC | PRN

## 2021-12-10 MED ORDER — OXYCODONE HCL 5 MG PO TABS: 5.0000 mg | ORAL_TABLET | Freq: Four times a day (QID) | ORAL | 0 refills | Status: AC | PRN

## 2021-12-10 NOTE — Discharge Summary (Signed)
OB Discharge Summary  Patient Name: Erin Goodwin DOB: Apr 29, 1995 MRN: [id number]  Date of admission: 12/08/2021 Delivering provider: Olivia Mackie   Admitting diagnosis: Previous cesarean section [Z98.891] Previous cesarean delivery affecting pregnancy [O34.219] Intrauterine pregnancy: [redacted]w[redacted]d     Secondary diagnosis: Patient Active Problem List   Diagnosis Date Noted   Iron deficiency anemia of pregnancy 12/09/2021   Status post repeat low transverse cesarean section 12/08/2021   Postpartum care following cesarean delivery 10/9 12/08/2021   Surrogate pregnancy 12/08/2021   Previous cesarean delivery affecting pregnancy 12/08/2021   Additional problems:none   Date of discharge: 12/10/2021   Discharge diagnosis: Principal Problem:   Postpartum care following cesarean delivery 10/9 Active Problems:   Status post repeat low transverse cesarean section   Surrogate pregnancy   Previous cesarean delivery affecting pregnancy   Iron deficiency anemia of pregnancy                                                              Post partum procedures: none  Augmentation: N/A Pain control: Spinal  Laceration:None  Episiotomy:None  Complications: None  Hospital course:  Sceduled C/S   26 y.o. yo G3P3003 at [redacted]w[redacted]d was admitted to the hospital 12/08/2021 for scheduled cesarean section with the following indication:Elective Repeat.Delivery details are as follows:  Membrane Rupture Time/Date: 3:06 PM ,12/08/2021   Delivery Method:C-Section, Low Transverse  Details of operation can be found in separate operative note.  Patient had an uncomplicated postpartum course.  She is ambulating, tolerating a regular diet, passing flatus, and urinating well. Patient is discharged home in stable condition on  12/10/21        Newborn Data: Birth date:12/08/2021  Birth time:3:06 PM  Gender:Female  Living status:Living  Apgars:8 ,8  Weight:3850 g     Subjective: Feels well today,  desires discharge. Baby has been discharged with biologic parents 12/09/2021. Patient is pumping colostrum for them.  Physical exam  Vitals:   12/09/21 0130 12/09/21 0505 12/09/21 1140 12/10/21 0510  BP: (!) 103/54 (!) 90/53 100/65 98/61  Pulse: 60 68  73  Resp: 16 18 16 20   Temp: 98.2 F (36.8 C)  98.2 F (36.8 C) 98.6 F (37 C)  TempSrc:   Oral Oral  SpO2:  100% 100% 99%  Weight:      Height:       General: alert, cooperative, and no distress Lochia: appropriate Uterine Fundus: firm Incision: Healing well with no significant drainage, No significant erythema, Dressing is clean, dry, and intact DVT Evaluation: No cords or calf tenderness. No significant calf/ankle edema. Labs: Lab Results  Component Value Date   WBC 15.9 (H) 12/09/2021   HGB 7.6 (L) 12/09/2021   HCT 23.2 (L) 12/09/2021   MCV 84.7 12/09/2021   PLT 361 12/09/2021      Latest Ref Rng & Units 04/24/2020    1:36 PM  CMP  Glucose 65 - 99 mg/dL 84   BUN 6 - 20 mg/dL 9   Creatinine 04/26/2020 - 2.11 mg/dL 9.41   Sodium 7.40 - 814 mmol/L 139   Potassium 3.5 - 5.2 mmol/L 4.4   Chloride 96 - 106 mmol/L 102   CO2 20 - 29 mmol/L 23   Calcium 8.7 - 10.2 mg/dL 9.4   Total Protein  6.0 - 8.5 g/dL 7.1   Total Bilirubin 0.0 - 1.2 mg/dL <4.0   Alkaline Phos 44 - 121 IU/L 71   AST 0 - 40 IU/L 17   ALT 0 - 32 IU/L 16       12/08/2021    5:15 PM 09/19/2019    4:12 PM 06/01/2017    9:45 PM  Edinburgh Postnatal Depression Scale Screening Tool  I have been able to laugh and see the funny side of things. 0 0   I have looked forward with enjoyment to things. 0 0   I have blamed myself unnecessarily when things went wrong. 1 1   I have been anxious or worried for no good reason. 0 1   I have felt scared or panicky for no good reason. 0 0   Things have been getting on top of me. 0 1   I have been so unhappy that I have had difficulty sleeping. 0 0   I have felt sad or miserable. 0 0   I have been so unhappy that I have been  crying. 0 0   The thought of harming myself has occurred to me. 0 0   Edinburgh Postnatal Depression Scale Total 1 3      Information is confidential and restricted. Go to Review Flowsheets to unlock data.   Vaccines: TDaP         UTD         Flu declined Discharge instruction:  per After Visit Summary,  [facility] booklet and  "[facility]" hospital booklet After Visit Meds:  Allergies as of 12/10/2021   No Known Allergies      Medication List     TAKE these medications    acetaminophen 500 MG tablet Commonly known as: TYLENOL Take 2 tablets (1,000 mg total) by mouth every 6 (six) hours. What changed:  medication strength how much to take when to take this reasons to take this   calcium carbonate 500 MG chewable tablet Commonly known as: TUMS - dosed in mg elemental calcium Chew 1-2 tablets by mouth daily as needed for indigestion or heartburn.   coconut oil Oil Apply 1 Application topically as needed.   esomeprazole 20 MG capsule Commonly known as: NEXIUM Take 20 mg by mouth daily as needed (acid reflux).   ibuprofen 600 MG tablet Commonly known as: ADVIL Take 1 tablet (600 mg total) by mouth every 6 (six) hours.   iron polysaccharides 150 MG capsule Commonly known as: Ferrex 150 Take 1 capsule (150 mg total) by mouth daily.   magnesium oxide 400 (240 Mg) MG tablet Commonly known as: MAG-OX Take 1 tablet (400 mg total) by mouth daily. For prevention of constipation.   oxyCODONE 5 MG immediate release tablet Commonly known as: Oxy IR/ROXICODONE Take 1 tablet (5 mg total) by mouth every 6 (six) hours as needed for up to 5 days for moderate pain.   PRENATAL PO Take 1 tablet by mouth daily.   senna-docusate 8.6-50 MG tablet Commonly known as: Senokot-S Take 2 tablets by mouth daily.   simethicone 80 MG chewable tablet Commonly known as: MYLICON Chew 1 tablet (80 mg total) by mouth as needed for flatulence.       Diet: iron  rich diet Activity: Advance as tolerated. Pelvic rest for 6 weeks.  Postpartum contraception: TBA in office Newborn Data: Live born female  Birth Weight: 8 lb 7.8 oz (3850 g) APGAR: 8, 8  Newborn Delivery  Birth date/time: 12/08/2021 15:06:00 Delivery type: C-Section, Low Transverse Trial of labor: No C-section categorization: Repeat     Delivery Report: Review the Delivery Report for details.   Follow up:  Follow-up Information     Brien Few, MD. Schedule an appointment as soon as possible for a visit.   Specialty: Obstetrics and Gynecology Why: For Postpartum follow-up Contact information: [address] [place] [address] [phone]                Signed: Juliene Pina, CNM, MSN 12/10/2021, 10:51 AM

## 2021-12-10 NOTE — Discharge Instructions (Signed)
Lactation outpatient support - home visit ° ° °Jessica Bowers, IBCLC (lactation consultant)  & Birth Doula ° °Phone (text or call): [phone] °Email: [email or web address] °[email or web address] ° ° °Linda Coppola °RN, MHA, IBCLC °at [facility]: Lactation Consultant ° °[email or web address] °Mail: [email or web address] °Tel: [phone] ° ° °Additional breastfeeding resources: ° °[facility] °https://ibconline.ca/information-sheets/ ° °[employer] ° °www.lllofnc.org ° ° °Other Resources: ° °Chiropractic specialist  ° °Dr. Leanna Hastings °[email or web address] ° ° °Craniosacral therapy for baby ° °Erin Balkind  °[email or web address]://[email or web address] ° °

## 2021-12-16 DIAGNOSIS — T888XXA Other specified complications of surgical and medical care, not elsewhere classified, initial encounter: Secondary | ICD-10-CM | POA: Diagnosis not present

## 2021-12-17 DIAGNOSIS — R238 Other skin changes: Secondary | ICD-10-CM | POA: Diagnosis not present

## 2021-12-17 DIAGNOSIS — T888XXA Other specified complications of surgical and medical care, not elsewhere classified, initial encounter: Secondary | ICD-10-CM | POA: Diagnosis not present

## 2021-12-18 ENCOUNTER — Telehealth (HOSPITAL_COMMUNITY): Payer: Self-pay | Admitting: *Deleted

## 2021-12-18 NOTE — Telephone Encounter (Signed)
Attempted Hospital Discharge Follow-Up Call.  Left voice mail requesting that patient return RN's phone call if patient has any concerns or questions regarding herself or her baby.  

## 2021-12-22 DIAGNOSIS — T888XXA Other specified complications of surgical and medical care, not elsewhere classified, initial encounter: Secondary | ICD-10-CM | POA: Diagnosis not present

## 2022-01-13 DIAGNOSIS — Z124 Encounter for screening for malignant neoplasm of cervix: Secondary | ICD-10-CM | POA: Diagnosis not present

## 2022-03-04 IMAGING — CT CT ANGIO CHEST
2 of 8 series · 19 of 36 positions shown · IV contrast (Omnipaque)
Comparison: 02/21/2016

CLINICAL DATA: Cesarean section 09/19/2019, sudden onset shortness
of breath and interscapular chest pain

EXAM:
CT ANGIOGRAPHY CHEST WITH CONTRAST
TECHNIQUE: Multidetector CT imaging of the chest was performed using the
standard protocol during bolus administration of intravenous
contrast. Multiplanar CT image reconstructions and MIPs were
obtained to evaluate the vascular anatomy.
CONTRAST:  100mL OMNIPAQUE IOHEXOL 350 MG/ML SOLN

[Series 6: pe thins · axial · 0.73mm/px · z∈[-164,+70]mm · 18 of 262 slices shown]
[im 14/262  lung]
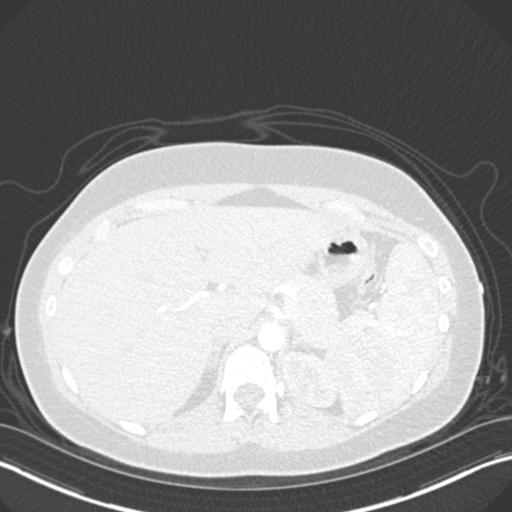
[im 28/262  mediastinal]
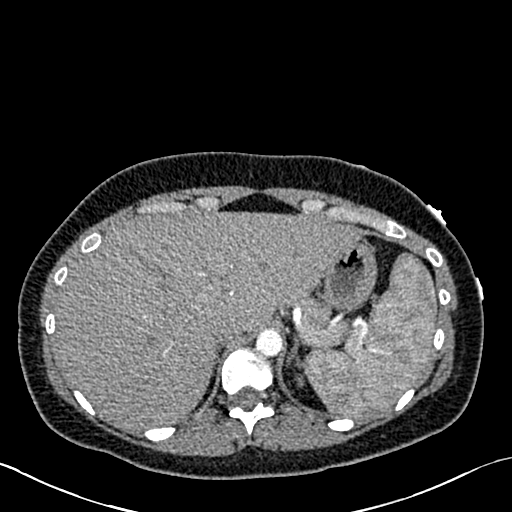
[im 42/262  lung]
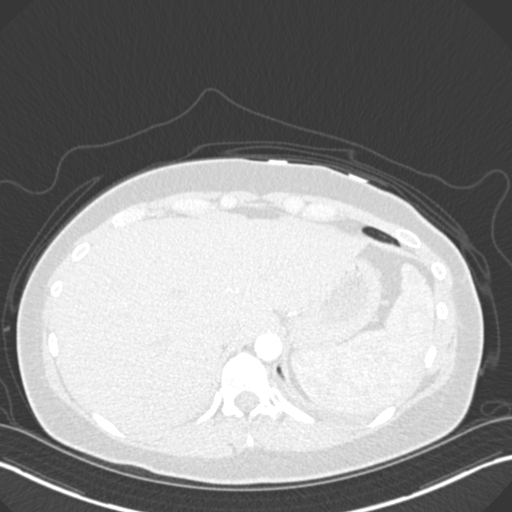
[im 55/262  mediastinal]
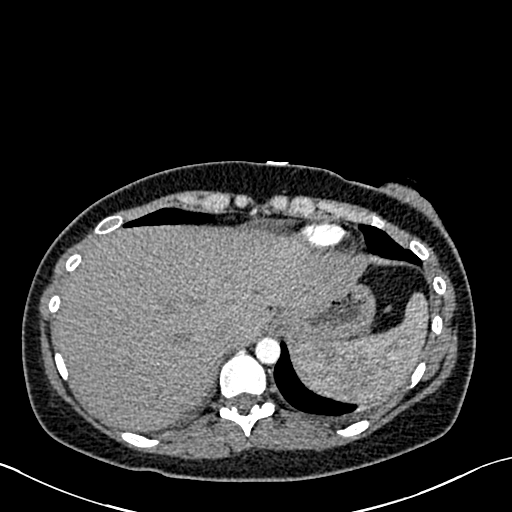
[im 69/262  lung]
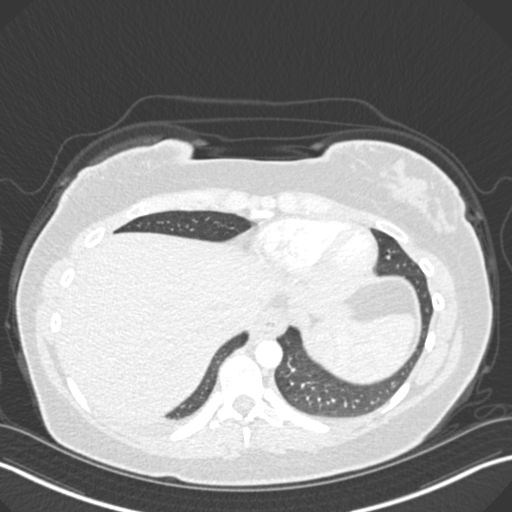
[im 83/262  mediastinal]
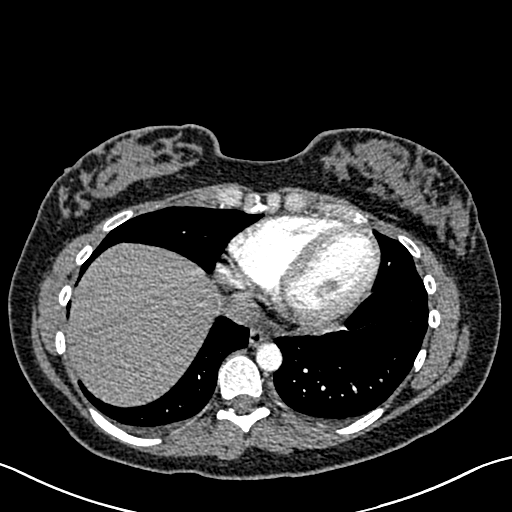
[im 97/262  lung]
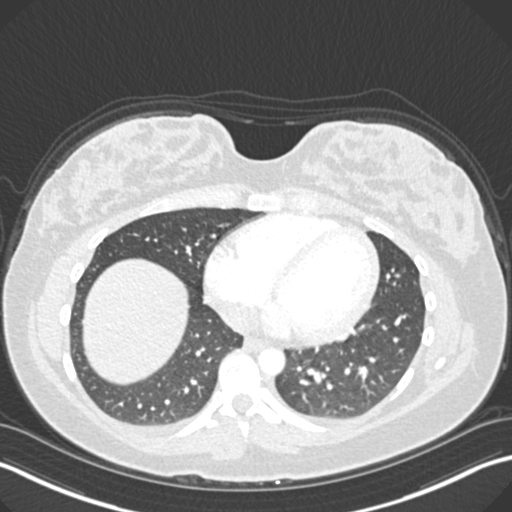
[im 110/262  mediastinal]
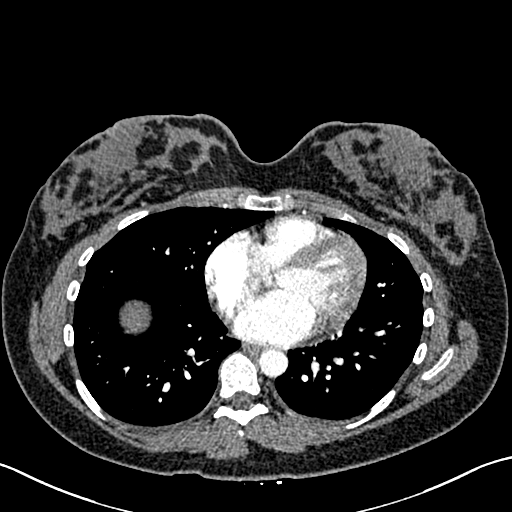
[im 124/262  lung]
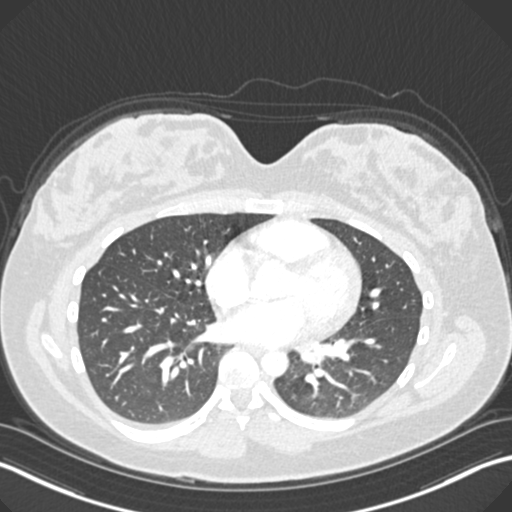
[im 138/262  mediastinal]
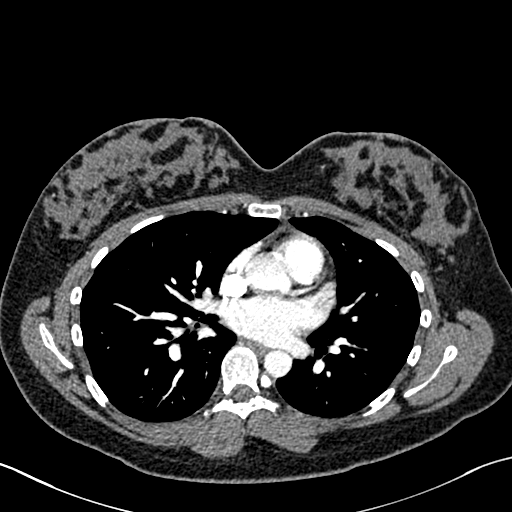
[im 152/262  lung]
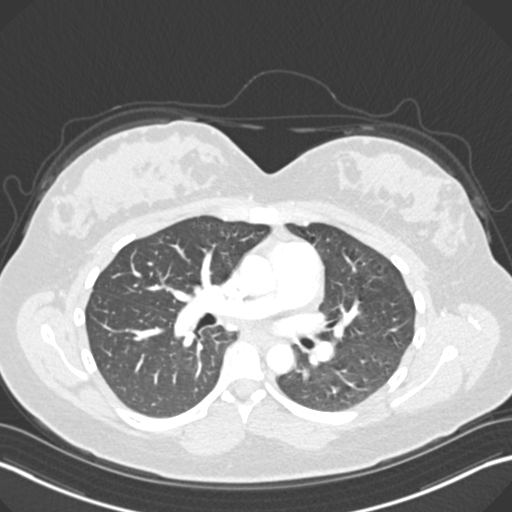
[im 165/262  mediastinal]
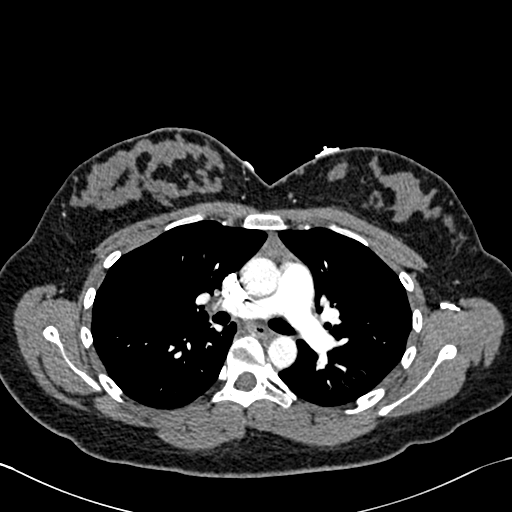
[im 179/262  lung]
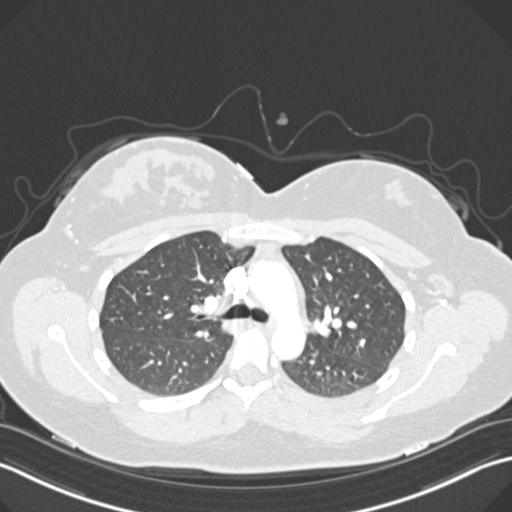
[im 193/262  mediastinal]
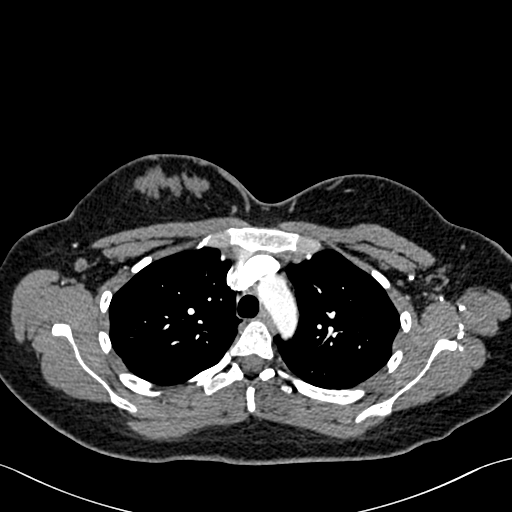
[im 207/262  lung]
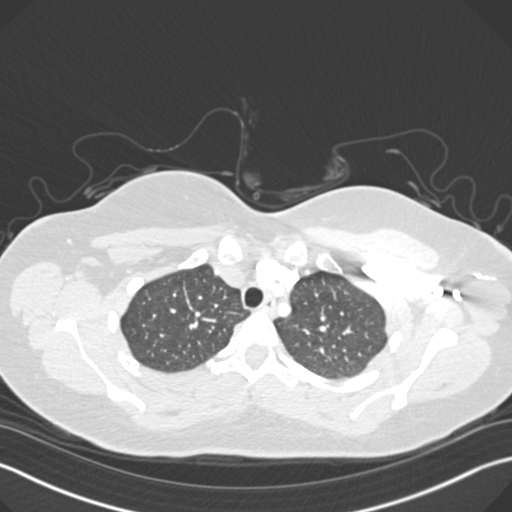
[im 220/262  mediastinal]
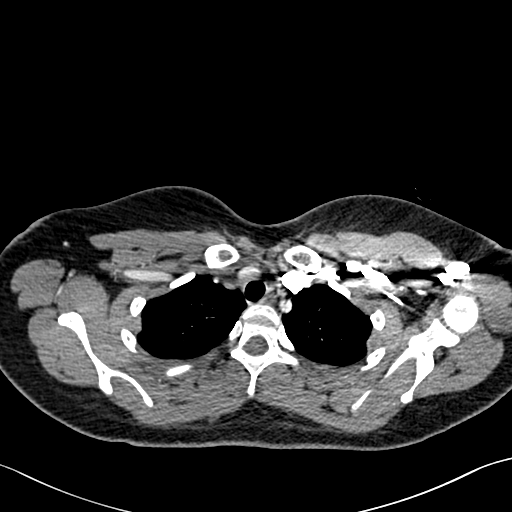
[im 234/262  lung]
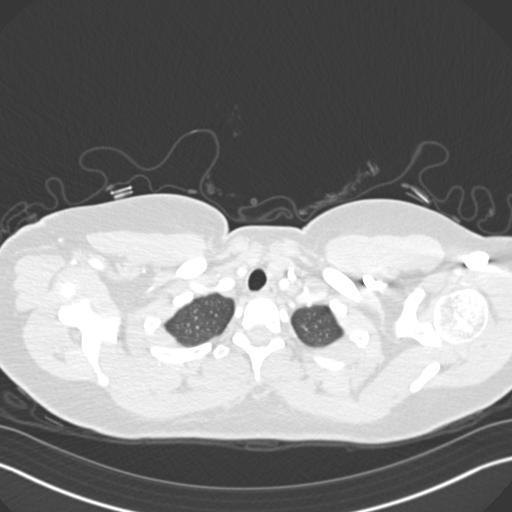
[im 248/262  mediastinal]
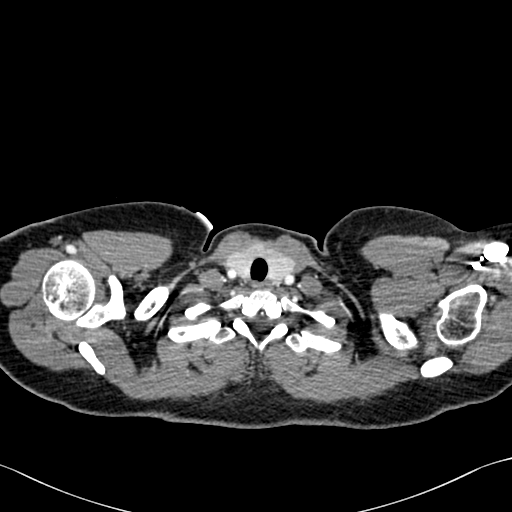

[Series 7: pe coronal mpr · coronal · 0.55mm/px · 1 of 115 slices shown]
[im 58/115  mediastinal]
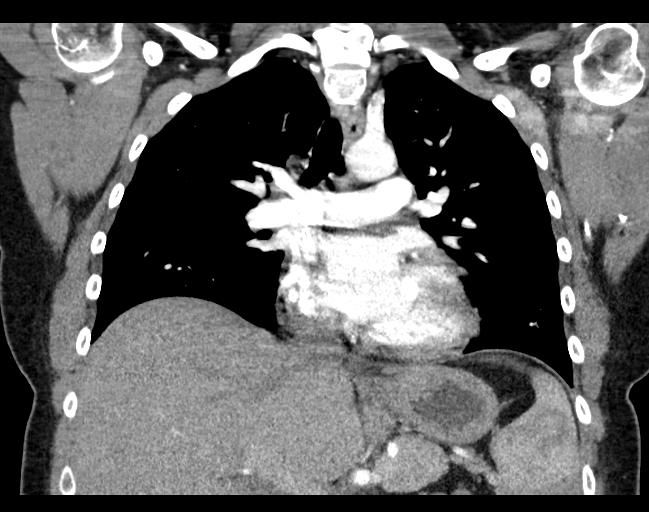

[19 of 36 positions shown; findings below may reference images not displayed]

FINDINGS: Cardiovascular: This is a technically adequate evaluation of the
pulmonary vasculature. There are no filling defects or pulmonary
emboli.

The heart is unremarkable without pericardial effusion. Thoracic
aorta is normal in caliber with no aneurysm or dissection.

Mediastinum/Nodes: No enlarged mediastinal, hilar, or axillary lymph
nodes. Thyroid gland, trachea, and esophagus demonstrate no
significant findings.

Lungs/Pleura: There are trace bilateral pleural effusions. No
airspace disease or pneumothorax. Central airways are patent.

Upper Abdomen: No acute abnormality.

Musculoskeletal: No acute or destructive bony lesions. Reconstructed
images demonstrate no additional findings.

Review of the MIP images confirms the above findings.
IMPRESSION: 1. No evidence of pulmonary embolus.
2. Unremarkable thoracic aorta.
3. Trace bilateral pleural effusions.

## 2022-03-21 ENCOUNTER — Encounter: Payer: Self-pay | Admitting: Family Medicine

## 2022-03-23 NOTE — Telephone Encounter (Signed)
LMOVM informing Pt immunization record emailed to email on file, and to call back to schedule appt with new PCP

## 2022-04-30 ENCOUNTER — Ambulatory Visit (INDEPENDENT_AMBULATORY_CARE_PROVIDER_SITE_OTHER): Payer: 59 | Admitting: Family Medicine

## 2022-04-30 ENCOUNTER — Encounter: Payer: Self-pay | Admitting: Family Medicine

## 2022-04-30 VITALS — BP 115/76 | HR 88 | Temp 97.9°F | Ht 65.0 in | Wt 175.6 lb

## 2022-04-30 DIAGNOSIS — R300 Dysuria: Secondary | ICD-10-CM | POA: Diagnosis not present

## 2022-04-30 DIAGNOSIS — N3000 Acute cystitis without hematuria: Secondary | ICD-10-CM

## 2022-04-30 LAB — MICROSCOPIC EXAMINATION
RBC, Urine: NONE SEEN /hpf (ref 0–2)
Renal Epithel, UA: NONE SEEN /hpf

## 2022-04-30 LAB — URINALYSIS, ROUTINE W REFLEX MICROSCOPIC
Bilirubin, UA: NEGATIVE
Glucose, UA: NEGATIVE
Ketones, UA: NEGATIVE
Nitrite, UA: NEGATIVE
Protein,UA: NEGATIVE
RBC, UA: NEGATIVE
Specific Gravity, UA: 1.015 (ref 1.005–1.030)
Urobilinogen, Ur: 0.2 mg/dL (ref 0.2–1.0)
pH, UA: 6.5 (ref 5.0–7.5)

## 2022-04-30 LAB — PREGNANCY, URINE: Preg Test, Ur: NEGATIVE

## 2022-04-30 MED ORDER — NITROFURANTOIN MONOHYD MACRO 100 MG PO CAPS: 100.0000 mg | ORAL_CAPSULE | Freq: Two times a day (BID) | ORAL | 0 refills | Status: AC

## 2022-04-30 NOTE — Progress Notes (Signed)
Subjective:  Patient ID: Erin Goodwin, female    DOB: 1995-04-19, 27 y.o.   MRN: [id number]  Patient Care Team: Brien Few, MD as Consulting Physician (Obstetrics and Gynecology)   Chief Complaint:  Dysuria (X 2 days )   HPI: Erin Goodwin is a 27 y.o. female presenting on 04/30/2022 for Dysuria (X 2 days )   Dysuria  This is a new problem. Episode onset: Tuesday. The problem occurs every urination. The quality of the pain is described as burning and aching. The pain is moderate. There has been no fever. She is Sexually active. There is No history of pyelonephritis. Associated symptoms include urgency. Pertinent negatives include no chills, discharge, flank pain, frequency, hematuria, hesitancy, nausea, possible pregnancy, sweats or vomiting. She has tried increased fluids and home medications for the symptoms. The treatment provided no relief. Her past medical history is significant for catheterization.    Relevant past medical, surgical, family, and social history reviewed and updated as indicated.  Allergies and medications reviewed and updated. Data reviewed: Chart in Epic.   Past Medical History:  Diagnosis Date   Chronic constipation    Chronic posterior anal fissure    External hemorrhoids    Family history of early CAD    Family history of headaches    GERD (gastroesophageal reflux disease)    Hirsutism    History of chicken pox    Hypotension    Migraines    Palpitations    PCOS (polycystic ovarian syndrome)     Past Surgical History:  Procedure Laterality Date   CESAREAN SECTION N/A 06/01/2017   Procedure: CESAREAN SECTION;  Surgeon: Brien Few, MD;  Location: [facility];  Service: Obstetrics;  Laterality: N/A;   CESAREAN SECTION N/A 09/19/2019   Procedure: Repeat CESAREAN SECTION;  Surgeon: Brien Few, MD;  Location: [place];  Service: Obstetrics;  Laterality: N/A;  EDD: 09/26/19 [place] RNFA   CESAREAN SECTION  N/A 12/08/2021   Procedure: Repeat CESAREAN SECTION;  Surgeon: Brien Few, MD;  Location: [facility];  Service: Obstetrics;  Laterality: N/A;  EDD: 12/13/21   TONSILLECTOMY AND ADENOIDECTOMY     WISDOM TOOTH EXTRACTION      Social History   Socioeconomic History   Marital status: Married    Spouse name: Not on file   Number of children: Not on file   Years of education: Not on file   Highest education level: Not on file  Occupational History   Not on file  Tobacco Use   Smoking status: Never   Smokeless tobacco: Never  Vaping Use   Vaping Use: Never used  Substance and Sexual Activity   Alcohol use: Yes    Alcohol/week: 0.0 standard drinks of alcohol    Comment: occasional   Drug use: No   Sexual activity: Yes    Partners: Male    Birth control/protection: Pill    Comment: pill previously  Other Topics Concern   Not on file  Social History Narrative   single, no children   [facility] [place]    had a photography business in the past   One caffeinated beverage daily   05/28/2016   Social Determinants of Health   Financial Resource Strain: Not on file  Food Insecurity: No Food Insecurity (12/08/2021)   Hunger Vital Sign    Worried About Running Out of Food in the Last Year: Never true    Ran Out of Food in the Last  Year: Never true  Transportation Needs: No Transportation Needs (12/08/2021)   PRAPARE - Hydrologist (Medical): No    Lack of Transportation (Non-Medical): No  Physical Activity: Not on file  Stress: Not on file  Social Connections: Not on file  Intimate Partner Violence: Not At Risk (12/08/2021)   Humiliation, Afraid, Rape, and Kick questionnaire    Fear of Current or Ex-Partner: No    Emotionally Abused: No    Physically Abused: No    Sexually Abused: No    Outpatient Encounter Medications as of 04/30/2022  Medication Sig   acetaminophen (TYLENOL) 500 MG tablet Take 2 tablets (1,000 mg total) by mouth every 6  (six) hours.   calcium carbonate (TUMS - DOSED IN MG ELEMENTAL CALCIUM) 500 MG chewable tablet Chew 1-2 tablets by mouth daily as needed for indigestion or heartburn.   coconut oil OIL Apply 1 Application topically as needed.   esomeprazole (NEXIUM) 20 MG capsule Take 20 mg by mouth daily as needed (acid reflux).   ibuprofen (ADVIL) 600 MG tablet Take 1 tablet (600 mg total) by mouth every 6 (six) hours.   nitrofurantoin, macrocrystal-monohydrate, (MACROBID) 100 MG capsule Take 1 capsule (100 mg total) by mouth 2 (two) times daily for 5 days. 1 po BId   iron polysaccharides (FERREX 150) 150 MG capsule Take 1 capsule (150 mg total) by mouth daily. (Patient not taking: Reported on 04/30/2022)   magnesium oxide (MAG-OX) 400 (240 Mg) MG tablet Take 1 tablet (400 mg total) by mouth daily. For prevention of constipation. (Patient not taking: Reported on 04/30/2022)   senna-docusate (SENOKOT-S) 8.6-50 MG tablet Take 2 tablets by mouth daily. (Patient not taking: Reported on 04/30/2022)   simethicone (MYLICON) 80 MG chewable tablet Chew 1 tablet (80 mg total) by mouth as needed for flatulence. (Patient not taking: Reported on 04/30/2022)   [DISCONTINUED] omeprazole (PRILOSEC) 20 MG capsule Take 1 capsule (20 mg total) by mouth daily. (Patient not taking: Reported on 09/05/2019)   [DISCONTINUED] Prenatal Vit-Fe Fumarate-FA (PRENATAL PO) Take 1 tablet by mouth daily.   No facility-administered encounter medications on file as of 04/30/2022.    No Known Allergies  Review of Systems  Constitutional:  Negative for activity change, appetite change, chills, diaphoresis, fatigue, fever and unexpected weight change.  HENT: Negative.    Eyes: Negative.  Negative for visual disturbance.  Respiratory:  Negative for cough, chest tightness and shortness of breath.   Cardiovascular:  Negative for chest pain, palpitations and leg swelling.  Gastrointestinal:  Negative for abdominal pain, blood in stool, constipation,  diarrhea, nausea and vomiting.  Endocrine: Negative.   Genitourinary:  Positive for dysuria and urgency. Negative for decreased urine volume, difficulty urinating, dyspareunia, enuresis, flank pain, frequency, genital sores, hematuria, hesitancy, menstrual problem, pelvic pain, vaginal bleeding, vaginal discharge and vaginal pain.  Musculoskeletal:  Negative for arthralgias and myalgias.  Skin: Negative.   Allergic/Immunologic: Negative.   Neurological:  Negative for dizziness and headaches.  Hematological: Negative.   Psychiatric/Behavioral:  Negative for confusion, hallucinations, sleep disturbance and suicidal ideas.   All other systems reviewed and are negative.       Objective:  BP 115/76   Pulse 88   Temp 97.9 F (36.6 C) (Temporal)   Ht '5\' 5"'$  (1.651 m)   Wt 175 lb 9.6 oz (79.7 kg)   SpO2 100%   BMI 29.22 kg/m    Wt Readings from Last 3 Encounters:  04/30/22 175 lb 9.6 oz (79.7  kg)  12/08/21 195 lb (88.5 kg)  11/27/21 195 lb (88.5 kg)    Physical Exam Vitals and nursing note reviewed.  Constitutional:      General: She is not in acute distress.    Appearance: Normal appearance. She is well-developed and well-groomed. She is not ill-appearing, toxic-appearing or diaphoretic.  HENT:     Head: Normocephalic and atraumatic.     Jaw: There is normal jaw occlusion.     Right Ear: Hearing normal.     Left Ear: Hearing normal.     Nose: Nose normal.     Mouth/Throat:     Lips: Pink.     Mouth: Mucous membranes are moist.     Pharynx: Uvula midline.  Eyes:     General: Lids are normal.     Pupils: Pupils are equal, round, and reactive to light.  Neck:     Thyroid: No thyroid mass, thyromegaly or thyroid tenderness.     Vascular: No carotid bruit or JVD.     Trachea: Trachea and phonation normal.  Cardiovascular:     Rate and Rhythm: Normal rate and regular rhythm.     Chest Wall: PMI is not displaced.     Pulses: Normal pulses.     Heart sounds: Normal heart  sounds.  Pulmonary:     Effort: Pulmonary effort is normal.     Breath sounds: Normal breath sounds.  Abdominal:     General: Bowel sounds are normal. There is no distension or abdominal bruit.     Palpations: Abdomen is soft. There is no hepatomegaly or splenomegaly.     Tenderness: There is no abdominal tenderness. There is no right CVA tenderness or left CVA tenderness.     Hernia: No hernia is present.  Musculoskeletal:        General: Normal range of motion.     Cervical back: Normal range of motion and neck supple.     Right lower leg: No edema.     Left lower leg: No edema.  Lymphadenopathy:     Cervical: No cervical adenopathy.  Skin:    General: Skin is warm and dry.     Capillary Refill: Capillary refill takes less than 2 seconds.     Coloration: Skin is not cyanotic, jaundiced or pale.     Findings: No rash.  Neurological:     General: No focal deficit present.     Mental Status: She is alert and oriented to person, place, and time.     Sensory: Sensation is intact.     Motor: Motor function is intact.     Coordination: Coordination is intact.     Gait: Gait is intact.     Deep Tendon Reflexes: Reflexes are normal and symmetric.  Psychiatric:        Attention and Perception: Attention and perception normal.        Mood and Affect: Mood and affect normal.        Speech: Speech normal.        Behavior: Behavior normal. Behavior is cooperative.        Thought Content: Thought content normal.        Cognition and Memory: Cognition and memory normal.        Judgment: Judgment normal.     Results for orders placed or performed during the hospital encounter of 12/08/21  CBC  Result Value Ref Range   WBC 15.9 (H) 4.0 - 10.5 K/uL   RBC 2.74 (L) 3.87 -  5.11 MIL/uL   Hemoglobin 7.6 (L) 12.0 - 15.0 g/dL   HCT 23.2 (L) 36.0 - 46.0 %   MCV 84.7 80.0 - 100.0 fL   MCH 27.7 26.0 - 34.0 pg   MCHC 32.8 30.0 - 36.0 g/dL   RDW 14.0 11.5 - 15.5 %   Platelets 361 150 - 400 K/uL    nRBC 0.0 0.0 - 0.2 %  Collect bld for placenta donatation  Result Value Ref Range   Placenta donation bld collect Collected by Laboratory        Pertinent labs & imaging results that were available during my care of the patient were reviewed by me and considered in my medical decision making.  Assessment & Plan:  Mariatheresa was seen today for dysuria.  Diagnoses and all orders for this visit:  Dysuria Urine pregnancy negative. Urinalysis with 2+ leukocytes and few bacteria.  -     Urinalysis, Routine w reflex microscopic -     Pregnancy, urine -     Urine Culture  Acute cystitis without hematuria Culture added. Will initiate below and change if warranted. Symptomatic care discussed in detail. No red flags concerning for acute pyelonephritis. Pt aware to report new, worsening, or persistent symptoms.   -     nitrofurantoin, macrocrystal-monohydrate, (MACROBID) 100 MG capsule; Take 1 capsule (100 mg total) by mouth 2 (two) times daily for 5 days. 1 po BId     Continue all other maintenance medications.  Follow up plan: Return if symptoms worsen or fail to improve, for schedule est care visit with GM.   Continue healthy lifestyle choices, including diet (rich in fruits, vegetables, and lean proteins, and low in salt and simple carbohydrates) and exercise (at least 30 minutes of moderate physical activity daily).  Educational handout given for UTI  The above assessment and management plan was discussed with the patient. The patient verbalized understanding of and has agreed to the management plan. Patient is aware to call the clinic if they develop any new symptoms or if symptoms persist or worsen. Patient is aware when to return to the clinic for a follow-up visit. Patient educated on when it is appropriate to go to the emergency department.   Monia Pouch, FNP-C [facility] [phone]

## 2022-05-05 LAB — URINE CULTURE

## 2022-06-26 DIAGNOSIS — Z3491 Encounter for supervision of normal pregnancy, unspecified, first trimester: Secondary | ICD-10-CM | POA: Diagnosis not present

## 2022-06-26 DIAGNOSIS — Z3689 Encounter for other specified antenatal screening: Secondary | ICD-10-CM | POA: Diagnosis not present

## 2022-06-30 DIAGNOSIS — Z3491 Encounter for supervision of normal pregnancy, unspecified, first trimester: Secondary | ICD-10-CM | POA: Diagnosis not present

## 2022-07-14 DIAGNOSIS — Z3201 Encounter for pregnancy test, result positive: Secondary | ICD-10-CM | POA: Diagnosis not present

## 2022-07-29 DIAGNOSIS — Z3A08 8 weeks gestation of pregnancy: Secondary | ICD-10-CM | POA: Diagnosis not present

## 2022-07-29 DIAGNOSIS — O368910 Maternal care for other specified fetal problems, first trimester, not applicable or unspecified: Secondary | ICD-10-CM | POA: Diagnosis not present

## 2022-09-01 DIAGNOSIS — Z3A13 13 weeks gestation of pregnancy: Secondary | ICD-10-CM | POA: Diagnosis not present

## 2022-09-01 DIAGNOSIS — Z3483 Encounter for supervision of other normal pregnancy, third trimester: Secondary | ICD-10-CM | POA: Diagnosis not present

## 2022-09-01 DIAGNOSIS — Z3689 Encounter for other specified antenatal screening: Secondary | ICD-10-CM | POA: Diagnosis not present

## 2022-09-01 DIAGNOSIS — Z3482 Encounter for supervision of other normal pregnancy, second trimester: Secondary | ICD-10-CM | POA: Diagnosis not present

## 2022-09-01 DIAGNOSIS — Z1331 Encounter for screening for depression: Secondary | ICD-10-CM | POA: Diagnosis not present

## 2022-09-01 DIAGNOSIS — O3680X0 Pregnancy with inconclusive fetal viability, not applicable or unspecified: Secondary | ICD-10-CM | POA: Diagnosis not present

## 2022-09-01 LAB — OB RESULTS CONSOLE HEPATITIS B SURFACE ANTIGEN: Hepatitis B Surface Ag: NEGATIVE

## 2022-09-01 LAB — OB RESULTS CONSOLE RUBELLA ANTIBODY, IGM
Rubella: IMMUNE
Rubella: IMMUNE

## 2022-09-01 LAB — OB RESULTS CONSOLE HIV ANTIBODY (ROUTINE TESTING): HIV: NONREACTIVE

## 2022-09-01 LAB — OB RESULTS CONSOLE RPR: RPR: NONREACTIVE

## 2022-09-22 DIAGNOSIS — Z118 Encounter for screening for other infectious and parasitic diseases: Secondary | ICD-10-CM | POA: Diagnosis not present

## 2022-09-22 DIAGNOSIS — Z3689 Encounter for other specified antenatal screening: Secondary | ICD-10-CM | POA: Diagnosis not present

## 2022-09-22 DIAGNOSIS — Z361 Encounter for antenatal screening for raised alphafetoprotein level: Secondary | ICD-10-CM | POA: Diagnosis not present

## 2022-09-22 LAB — OB RESULTS CONSOLE GC/CHLAMYDIA
Chlamydia: NEGATIVE
Neisseria Gonorrhea: NEGATIVE

## 2022-10-12 DIAGNOSIS — Z363 Encounter for antenatal screening for malformations: Secondary | ICD-10-CM | POA: Diagnosis not present

## 2022-10-26 ENCOUNTER — Inpatient Hospital Stay (HOSPITAL_COMMUNITY)
Admission: AD | Admit: 2022-10-26 | Discharge: 2022-10-26 | Disposition: A | Discharge status: Home / Self Care | Payer: 59 | Attending: Obstetrics and Gynecology | Admitting: Obstetrics and Gynecology

## 2022-10-26 ENCOUNTER — Encounter (HOSPITAL_COMMUNITY): Payer: Self-pay | Admitting: *Deleted

## 2022-10-26 ENCOUNTER — Other Ambulatory Visit: Payer: Self-pay

## 2022-10-26 DIAGNOSIS — Z3A21 21 weeks gestation of pregnancy: Secondary | ICD-10-CM | POA: Diagnosis not present

## 2022-10-26 DIAGNOSIS — T1490XA Injury, unspecified, initial encounter: Secondary | ICD-10-CM | POA: Insufficient documentation

## 2022-10-26 DIAGNOSIS — O26892 Other specified pregnancy related conditions, second trimester: Secondary | ICD-10-CM | POA: Insufficient documentation

## 2022-10-26 NOTE — MAU Note (Signed)
FHT: 150 by doppler  Pt denies any VB.
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
## 2022-12-16 DIAGNOSIS — Z3689 Encounter for other specified antenatal screening: Secondary | ICD-10-CM | POA: Diagnosis not present

## 2022-12-28 DIAGNOSIS — Z3483 Encounter for supervision of other normal pregnancy, third trimester: Secondary | ICD-10-CM | POA: Diagnosis not present

## 2022-12-28 DIAGNOSIS — Z3482 Encounter for supervision of other normal pregnancy, second trimester: Secondary | ICD-10-CM | POA: Diagnosis not present

## 2023-01-07 DIAGNOSIS — N898 Other specified noninflammatory disorders of vagina: Secondary | ICD-10-CM | POA: Diagnosis not present

## 2023-01-07 DIAGNOSIS — O26893 Other specified pregnancy related conditions, third trimester: Secondary | ICD-10-CM | POA: Diagnosis not present

## 2023-01-07 DIAGNOSIS — Z3483 Encounter for supervision of other normal pregnancy, third trimester: Secondary | ICD-10-CM | POA: Diagnosis not present

## 2023-01-07 DIAGNOSIS — Z3A31 31 weeks gestation of pregnancy: Secondary | ICD-10-CM | POA: Diagnosis not present

## 2023-01-07 DIAGNOSIS — Z23 Encounter for immunization: Secondary | ICD-10-CM | POA: Diagnosis not present

## 2023-01-19 ENCOUNTER — Other Ambulatory Visit: Payer: Self-pay | Admitting: Obstetrics and Gynecology

## 2023-02-03 DIAGNOSIS — Z3685 Encounter for antenatal screening for Streptococcus B: Secondary | ICD-10-CM | POA: Diagnosis not present

## 2023-02-09 DIAGNOSIS — O26843 Uterine size-date discrepancy, third trimester: Secondary | ICD-10-CM | POA: Diagnosis not present

## 2023-02-09 DIAGNOSIS — Z3A36 36 weeks gestation of pregnancy: Secondary | ICD-10-CM | POA: Diagnosis not present

## 2023-02-09 NOTE — Patient Instructions (Signed)
Erin Goodwin  02/09/2023   Your procedure is scheduled on:  02/21/2023  Arrive at 0800 at Entrance C on [address] at [facility]  and [employer]. You are invited to use the FREE valet parking or use the Visitor's parking deck.  Pick up the phone at the desk and dial [phone].  Call this number if you have problems the morning of surgery: [phone]  Remember:   Do not eat food:(After Midnight) Desps de medianoche.  You may drink clear liquids until arrival at ___0800__.  Clear liquids means a liquid you can see thru.  It can have color such as Cola or Kool aid.  Tea is OK and coffee as long as no milk or creamer of any kind.  Take these medicines the morning of surgery with A SIP OF WATER:  none   Do not wear jewelry, make-up or nail polish.  Do not wear lotions, powders, or perfumes. Do not wear deodorant.  Do not shave 48 hours prior to surgery.  Do not bring valuables to the hospital.  [facility] is not   responsible for any belongings or valuables brought to the hospital.  Contacts, dentures or bridgework may not be worn into surgery.  Leave suitcase in the car. After surgery it may be brought to your room.  For patients admitted to the hospital, checkout time is 11:00 AM the day of              discharge.      Please read over the following fact sheets that you were given:     Preparing for Surgery

## 2023-02-10 ENCOUNTER — Telehealth (HOSPITAL_COMMUNITY): Payer: Self-pay | Admitting: *Deleted

## 2023-02-10 ENCOUNTER — Encounter (HOSPITAL_COMMUNITY): Payer: Self-pay

## 2023-02-10 NOTE — Telephone Encounter (Signed)
Preadmission screen  

## 2023-02-12 ENCOUNTER — Encounter (HOSPITAL_COMMUNITY): Payer: Self-pay

## 2023-02-19 ENCOUNTER — Encounter (HOSPITAL_COMMUNITY)
Admission: RE | Admit: 2023-02-19 | Discharge: 2023-02-19 | Disposition: A | Discharge status: Home / Self Care | Payer: 59 | Source: Ambulatory Visit | Attending: Obstetrics and Gynecology | Admitting: Obstetrics and Gynecology

## 2023-02-19 DIAGNOSIS — Z01812 Encounter for preprocedural laboratory examination: Secondary | ICD-10-CM | POA: Insufficient documentation

## 2023-02-19 LAB — CBC
HCT: 36.3 % (ref 36.0–46.0)
Hemoglobin: 11.9 g/dL — ABNORMAL LOW (ref 12.0–15.0)
MCH: 30.3 pg (ref 26.0–34.0)
MCHC: 32.8 g/dL (ref 30.0–36.0)
MCV: 92.4 fL (ref 80.0–100.0)
Platelets: 403 10*3/uL — ABNORMAL HIGH (ref 150–400)
RBC: 3.93 MIL/uL (ref 3.87–5.11)
RDW: 13.2 % (ref 11.5–15.5)
WBC: 9.8 10*3/uL (ref 4.0–10.5)
nRBC: 0 % (ref 0.0–0.2)

## 2023-02-19 LAB — TYPE AND SCREEN
ABO/RH(D): A POS
Antibody Screen: NEGATIVE

## 2023-02-20 LAB — RPR: RPR Ser Ql: NONREACTIVE

## 2023-02-21 ENCOUNTER — Encounter (HOSPITAL_COMMUNITY)
Admission: AD | Disposition: A | Discharge status: Home / Self Care | Payer: Self-pay | Source: Home / Self Care | Attending: Obstetrics and Gynecology

## 2023-02-21 ENCOUNTER — Inpatient Hospital Stay (HOSPITAL_COMMUNITY)
Admission: AD | Admit: 2023-02-21 | Discharge: 2023-02-23 | DRG: 787 | Disposition: A | Discharge status: Home / Self Care | Payer: 59 | Attending: Obstetrics and Gynecology | Admitting: Obstetrics and Gynecology

## 2023-02-21 ENCOUNTER — Encounter (HOSPITAL_COMMUNITY): Payer: Self-pay | Admitting: Obstetrics and Gynecology

## 2023-02-21 ENCOUNTER — Inpatient Hospital Stay (HOSPITAL_COMMUNITY): Payer: 59 | Admitting: Anesthesiology

## 2023-02-21 ENCOUNTER — Other Ambulatory Visit: Payer: Self-pay

## 2023-02-21 DIAGNOSIS — K219 Gastro-esophageal reflux disease without esophagitis: Secondary | ICD-10-CM | POA: Diagnosis not present

## 2023-02-21 DIAGNOSIS — O9962 Diseases of the digestive system complicating childbirth: Secondary | ICD-10-CM | POA: Diagnosis not present

## 2023-02-21 DIAGNOSIS — O34219 Maternal care for unspecified type scar from previous cesarean delivery: Secondary | ICD-10-CM

## 2023-02-21 DIAGNOSIS — Z833 Family history of diabetes mellitus: Secondary | ICD-10-CM

## 2023-02-21 DIAGNOSIS — Z8249 Family history of ischemic heart disease and other diseases of the circulatory system: Secondary | ICD-10-CM

## 2023-02-21 DIAGNOSIS — O34211 Maternal care for low transverse scar from previous cesarean delivery: Principal | ICD-10-CM | POA: Diagnosis present

## 2023-02-21 DIAGNOSIS — O9081 Anemia of the puerperium: Secondary | ICD-10-CM | POA: Diagnosis not present

## 2023-02-21 DIAGNOSIS — Z3A38 38 weeks gestation of pregnancy: Secondary | ICD-10-CM | POA: Diagnosis not present

## 2023-02-21 DIAGNOSIS — R634 Abnormal weight loss: Secondary | ICD-10-CM | POA: Diagnosis not present

## 2023-02-21 DIAGNOSIS — D62 Acute posthemorrhagic anemia: Secondary | ICD-10-CM | POA: Diagnosis not present

## 2023-02-21 DIAGNOSIS — Z98891 History of uterine scar from previous surgery: Secondary | ICD-10-CM

## 2023-02-21 DIAGNOSIS — Z2882 Immunization not carried out because of caregiver refusal: Secondary | ICD-10-CM | POA: Diagnosis not present

## 2023-02-21 DIAGNOSIS — Z302 Encounter for sterilization: Secondary | ICD-10-CM | POA: Diagnosis not present

## 2023-02-21 DIAGNOSIS — Z3A00 Weeks of gestation of pregnancy not specified: Secondary | ICD-10-CM | POA: Diagnosis not present

## 2023-02-21 DIAGNOSIS — Z640 Problems related to unwanted pregnancy: Secondary | ICD-10-CM | POA: Diagnosis not present

## 2023-02-21 HISTORY — PX: TUBAL LIGATION: SHX77

## 2023-02-21 SURGERY — Surgical Case
Anesthesia: Spinal | Laterality: Bilateral

## 2023-02-21 MED ORDER — OXYTOCIN-SODIUM CHLORIDE 30-0.9 UT/500ML-% IV SOLN
INTRAVENOUS | Status: DC | PRN
  Administered 2023-02-21: 30 [IU] via INTRAVENOUS

## 2023-02-21 MED ORDER — METHYLERGONOVINE MALEATE 0.2 MG/ML IJ SOLN: 0.2000 mg | INTRAMUSCULAR | Status: DC | PRN

## 2023-02-21 MED ORDER — OXYTOCIN-SODIUM CHLORIDE 30-0.9 UT/500ML-% IV SOLN
INTRAVENOUS | Status: AC
  Filled 2023-02-21: qty 500

## 2023-02-21 MED ORDER — SCOPOLAMINE 1 MG/3DAYS TD PT72
MEDICATED_PATCH | TRANSDERMAL | Status: AC
Start: 2023-02-21 — End: ?
  Filled 2023-02-21: qty 1

## 2023-02-21 MED ORDER — KETOROLAC TROMETHAMINE 30 MG/ML IJ SOLN: 30.0000 mg | Freq: Four times a day (QID) | INTRAMUSCULAR | Status: DC | PRN

## 2023-02-21 MED ORDER — MORPHINE SULFATE (PF) 0.5 MG/ML IJ SOLN
INTRAMUSCULAR | Status: DC | PRN
  Administered 2023-02-21: 150 ug via INTRATHECAL

## 2023-02-21 MED ORDER — KETOROLAC TROMETHAMINE 30 MG/ML IJ SOLN
INTRAMUSCULAR | Status: AC
  Filled 2023-02-21: qty 1

## 2023-02-21 MED ORDER — SIMETHICONE 80 MG PO CHEW
80.0000 mg | CHEWABLE_TABLET | Freq: Three times a day (TID) | ORAL | Status: DC
  Administered 2023-02-21 – 2023-02-23 (×5): 80 mg via ORAL
  Filled 2023-02-21 (×5): qty 1

## 2023-02-21 MED ORDER — FENTANYL CITRATE (PF) 100 MCG/2ML IJ SOLN
INTRAMUSCULAR | Status: DC | PRN
  Administered 2023-02-21: 15 ug via INTRATHECAL

## 2023-02-21 MED ORDER — BUPIVACAINE HCL (PF) 0.25 % IJ SOLN
INTRAMUSCULAR | Status: DC | PRN
  Administered 2023-02-21: 10 mL

## 2023-02-21 MED ORDER — ONDANSETRON HCL 4 MG/2ML IJ SOLN: 4.0000 mg | Freq: Three times a day (TID) | INTRAMUSCULAR | Status: DC | PRN

## 2023-02-21 MED ORDER — IBUPROFEN 600 MG PO TABS
600.0000 mg | ORAL_TABLET | Freq: Four times a day (QID) | ORAL | Status: DC
  Administered 2023-02-22 – 2023-02-23 (×3): 600 mg via ORAL
  Filled 2023-02-21 (×4): qty 1

## 2023-02-21 MED ORDER — NALOXONE HCL 0.4 MG/ML IJ SOLN: 0.4000 mg | INTRAMUSCULAR | Status: DC | PRN

## 2023-02-21 MED ORDER — BUPIVACAINE HCL (PF) 0.25 % IJ SOLN
INTRAMUSCULAR | Status: AC
  Filled 2023-02-21: qty 30

## 2023-02-21 MED ORDER — LACTATED RINGERS IV SOLN: INTRAVENOUS | Status: DC

## 2023-02-21 MED ORDER — ORAL CARE MOUTH RINSE: 15.0000 mL | Freq: Once | OROMUCOSAL | Status: DC

## 2023-02-21 MED ORDER — BUPIVACAINE LIPOSOME 1.3 % IJ SUSP
20.0000 mL | Freq: Once | INTRAMUSCULAR | Status: AC
  Administered 2023-02-21: 266 mg

## 2023-02-21 MED ORDER — SOD CITRATE-CITRIC ACID 500-334 MG/5ML PO SOLN
30.0000 mL | Freq: Once | ORAL | Status: AC
  Administered 2023-02-21: 30 mL via ORAL

## 2023-02-21 MED ORDER — ACETAMINOPHEN 10 MG/ML IV SOLN
INTRAVENOUS | Status: DC | PRN
  Administered 2023-02-21: 1000 mg via INTRAVENOUS

## 2023-02-21 MED ORDER — OXYCODONE HCL 5 MG PO TABS
5.0000 mg | ORAL_TABLET | ORAL | Status: DC | PRN
Start: 2023-02-21 — End: 2023-02-23
  Administered 2023-02-22 (×2): 5 mg via ORAL
  Administered 2023-02-22 – 2023-02-23 (×3): 10 mg via ORAL
  Filled 2023-02-21: qty 2
  Filled 2023-02-21: qty 1
  Filled 2023-02-21: qty 2
  Filled 2023-02-21: qty 1
  Filled 2023-02-21: qty 2

## 2023-02-21 MED ORDER — METHYLERGONOVINE MALEATE 0.2 MG PO TABS: 0.2000 mg | ORAL_TABLET | ORAL | Status: DC | PRN

## 2023-02-21 MED ORDER — MORPHINE SULFATE (PF) 0.5 MG/ML IJ SOLN
INTRAMUSCULAR | Status: AC
  Filled 2023-02-21: qty 10

## 2023-02-21 MED ORDER — STERILE WATER FOR IRRIGATION IR SOLN
Status: DC | PRN
  Administered 2023-02-21: 1000 mL

## 2023-02-21 MED ORDER — ACETAMINOPHEN 160 MG/5ML PO SOLN: 960.0000 mg | Freq: Once | ORAL | Status: DC

## 2023-02-21 MED ORDER — WITCH HAZEL-GLYCERIN EX PADS: 1.0000 | MEDICATED_PAD | CUTANEOUS | Status: DC | PRN

## 2023-02-21 MED ORDER — FENTANYL CITRATE (PF) 100 MCG/2ML IJ SOLN
INTRAMUSCULAR | Status: AC
  Filled 2023-02-21: qty 2

## 2023-02-21 MED ORDER — PHENYLEPHRINE HCL-NACL 20-0.9 MG/250ML-% IV SOLN
INTRAVENOUS | Status: AC
  Filled 2023-02-21: qty 250

## 2023-02-21 MED ORDER — ACETAMINOPHEN 500 MG PO TABS
1000.0000 mg | ORAL_TABLET | Freq: Once | ORAL | Status: DC
Start: 2023-02-21 — End: 2023-02-21

## 2023-02-21 MED ORDER — SCOPOLAMINE 1 MG/3DAYS TD PT72
1.0000 | MEDICATED_PATCH | Freq: Once | TRANSDERMAL | Status: DC
  Administered 2023-02-21: 1.5 mg via TRANSDERMAL

## 2023-02-21 MED ORDER — DIBUCAINE (PERIANAL) 1 % EX OINT: 1.0000 | TOPICAL_OINTMENT | CUTANEOUS | Status: DC | PRN

## 2023-02-21 MED ORDER — BUPIVACAINE IN DEXTROSE 0.75-8.25 % IT SOLN
INTRATHECAL | Status: DC | PRN
  Administered 2023-02-21: 1.6 mL via INTRATHECAL

## 2023-02-21 MED ORDER — DEXAMETHASONE SODIUM PHOSPHATE 10 MG/ML IJ SOLN
INTRAMUSCULAR | Status: DC | PRN
  Administered 2023-02-21: 4 mg via INTRAVENOUS

## 2023-02-21 MED ORDER — KETOROLAC TROMETHAMINE 30 MG/ML IJ SOLN
30.0000 mg | Freq: Four times a day (QID) | INTRAMUSCULAR | Status: AC
  Administered 2023-02-21 – 2023-02-22 (×4): 30 mg via INTRAVENOUS
  Filled 2023-02-21 (×5): qty 1

## 2023-02-21 MED ORDER — ONDANSETRON HCL 4 MG/2ML IJ SOLN
INTRAMUSCULAR | Status: AC
  Filled 2023-02-21: qty 2

## 2023-02-21 MED ORDER — BUPIVACAINE LIPOSOME 1.3 % IJ SUSP
INTRAMUSCULAR | Status: AC
  Filled 2023-02-21: qty 20

## 2023-02-21 MED ORDER — POVIDONE-IODINE 10 % EX SWAB
2.0000 | Freq: Once | CUTANEOUS | Status: AC
  Administered 2023-02-21: 2 via TOPICAL

## 2023-02-21 MED ORDER — FAMOTIDINE 20 MG PO TABS
ORAL_TABLET | ORAL | Status: AC
  Filled 2023-02-21: qty 1

## 2023-02-21 MED ORDER — BUPIVACAINE LIPOSOME 1.3 % IJ SUSP
INTRAMUSCULAR | Status: AC
Start: 2023-02-21 — End: ?
  Filled 2023-02-21: qty 20

## 2023-02-21 MED ORDER — MENTHOL 3 MG MT LOZG: 1.0000 | LOZENGE | OROMUCOSAL | Status: DC | PRN

## 2023-02-21 MED ORDER — SCOPOLAMINE 1 MG/3DAYS TD PT72
1.0000 | MEDICATED_PATCH | Freq: Once | TRANSDERMAL | Status: DC
Start: 2023-02-21 — End: 2023-02-23

## 2023-02-21 MED ORDER — DIPHENHYDRAMINE HCL 50 MG/ML IJ SOLN
12.5000 mg | INTRAMUSCULAR | Status: DC | PRN
Start: 2023-02-21 — End: 2023-02-21

## 2023-02-21 MED ORDER — SIMETHICONE 80 MG PO CHEW
80.0000 mg | CHEWABLE_TABLET | ORAL | Status: DC | PRN
Start: 2023-02-21 — End: 2023-02-23

## 2023-02-21 MED ORDER — KETOROLAC TROMETHAMINE 30 MG/ML IJ SOLN
30.0000 mg | Freq: Four times a day (QID) | INTRAMUSCULAR | Status: DC | PRN
  Administered 2023-02-21: 30 mg via INTRAVENOUS

## 2023-02-21 MED ORDER — ACETAMINOPHEN 500 MG PO TABS
1000.0000 mg | ORAL_TABLET | Freq: Four times a day (QID) | ORAL | Status: DC
  Administered 2023-02-21 – 2023-02-23 (×7): 1000 mg via ORAL
  Filled 2023-02-21 (×9): qty 2

## 2023-02-21 MED ORDER — CEFAZOLIN SODIUM-DEXTROSE 2-4 GM/100ML-% IV SOLN
INTRAVENOUS | Status: AC
  Filled 2023-02-21: qty 100

## 2023-02-21 MED ORDER — DEXAMETHASONE SODIUM PHOSPHATE 10 MG/ML IJ SOLN
INTRAMUSCULAR | Status: AC
  Filled 2023-02-21: qty 1

## 2023-02-21 MED ORDER — ONDANSETRON HCL 4 MG/2ML IJ SOLN
INTRAMUSCULAR | Status: DC | PRN
  Administered 2023-02-21: 4 mg via INTRAVENOUS

## 2023-02-21 MED ORDER — PRENATAL MULTIVITAMIN CH
1.0000 | ORAL_TABLET | Freq: Every day | ORAL | Status: DC
  Administered 2023-02-22 – 2023-02-23 (×2): 1 via ORAL
  Filled 2023-02-21 (×2): qty 1

## 2023-02-21 MED ORDER — DIPHENHYDRAMINE HCL 25 MG PO CAPS
25.0000 mg | ORAL_CAPSULE | ORAL | Status: DC | PRN
Start: 2023-02-21 — End: 2023-02-21

## 2023-02-21 MED ORDER — CEFAZOLIN SODIUM-DEXTROSE 2-4 GM/100ML-% IV SOLN
2.0000 g | INTRAVENOUS | Status: AC
  Administered 2023-02-21: 2 g via INTRAVENOUS

## 2023-02-21 MED ORDER — PHENYLEPHRINE HCL-NACL 20-0.9 MG/250ML-% IV SOLN
INTRAVENOUS | Status: DC | PRN
  Administered 2023-02-21: 60 ug/min via INTRAVENOUS

## 2023-02-21 MED ORDER — ZOLPIDEM TARTRATE 5 MG PO TABS: 5.0000 mg | ORAL_TABLET | Freq: Every evening | ORAL | Status: DC | PRN

## 2023-02-21 MED ORDER — SENNOSIDES-DOCUSATE SODIUM 8.6-50 MG PO TABS
2.0000 | ORAL_TABLET | Freq: Every day | ORAL | Status: DC
  Administered 2023-02-22 – 2023-02-23 (×2): 2 via ORAL
  Filled 2023-02-21 (×2): qty 2

## 2023-02-21 MED ORDER — SODIUM CHLORIDE 0.9% FLUSH: 3.0000 mL | INTRAVENOUS | Status: DC | PRN

## 2023-02-21 MED ORDER — SODIUM CHLORIDE 0.9 % IR SOLN
Status: DC | PRN
  Administered 2023-02-21: 1000 mL

## 2023-02-21 MED ORDER — OXYTOCIN-SODIUM CHLORIDE 30-0.9 UT/500ML-% IV SOLN: 2.5000 [IU]/h | INTRAVENOUS | Status: AC

## 2023-02-21 MED ORDER — FAMOTIDINE 20 MG PO TABS
20.0000 mg | ORAL_TABLET | Freq: Once | ORAL | Status: AC
  Administered 2023-02-21: 20 mg via ORAL

## 2023-02-21 MED ORDER — SOD CITRATE-CITRIC ACID 500-334 MG/5ML PO SOLN
ORAL | Status: AC
Start: 2023-02-21 — End: ?
  Filled 2023-02-21: qty 30

## 2023-02-21 MED ORDER — NALOXONE HCL 4 MG/10ML IJ SOLN: 1.0000 ug/kg/h | INTRAVENOUS | Status: DC | PRN

## 2023-02-21 MED ORDER — DEXAMETHASONE SODIUM PHOSPHATE 4 MG/ML IJ SOLN
INTRAMUSCULAR | Status: AC
  Filled 2023-02-21: qty 1

## 2023-02-21 MED ORDER — DIPHENHYDRAMINE HCL 25 MG PO CAPS: 25.0000 mg | ORAL_CAPSULE | Freq: Four times a day (QID) | ORAL | Status: DC | PRN

## 2023-02-21 MED ORDER — CHLORHEXIDINE GLUCONATE 0.12 % MT SOLN: 15.0000 mL | Freq: Once | OROMUCOSAL | Status: DC

## 2023-02-21 MED ORDER — COCONUT OIL OIL: 1.0000 | TOPICAL_OIL | Status: DC | PRN

## 2023-02-21 SURGICAL SUPPLY — 32 items
BENZOIN TINCTURE PRP APPL 2/3 (GAUZE/BANDAGES/DRESSINGS) ×1 IMPLANT
CHLORAPREP W/TINT 26 (MISCELLANEOUS) ×4 IMPLANT
CLAMP UMBILICAL CORD (MISCELLANEOUS) ×2 IMPLANT
CLOTH BEACON ORANGE TIMEOUT ST (SAFETY) ×2 IMPLANT
DRSG OPSITE POSTOP 4X10 (GAUZE/BANDAGES/DRESSINGS) ×2 IMPLANT
ELECT REM PT RETURN 9FT ADLT (ELECTROSURGICAL) ×2
ELECTRODE REM PT RTRN 9FT ADLT (ELECTROSURGICAL) ×2 IMPLANT
EXTRACTOR VACUUM KIWI (MISCELLANEOUS) IMPLANT
GLOVE BIOGEL PI IND STRL 7.0 (GLOVE) ×2 IMPLANT
GLOVE SURG LTX SZ8 (GLOVE) ×2 IMPLANT
GOWN STRL REUS W/TWL LRG LVL3 (GOWN DISPOSABLE) ×4 IMPLANT
KIT ABG SYR 3ML LUER SLIP (SYRINGE) IMPLANT
LIGASURE IMPACT 36 18CM CVD LR (INSTRUMENTS) ×1 IMPLANT
NDL HYPO 25X5/8 SAFETYGLIDE (NEEDLE) IMPLANT
NDL SPNL 20GX3.5 QUINCKE YW (NEEDLE) IMPLANT
NEEDLE HYPO 22GX1.5 SAFETY (NEEDLE) ×2 IMPLANT
NEEDLE HYPO 25X5/8 SAFETYGLIDE (NEEDLE) IMPLANT
NEEDLE SPNL 20GX3.5 QUINCKE YW (NEEDLE) IMPLANT
NS IRRIG 1000ML POUR BTL (IV SOLUTION) ×2 IMPLANT
PACK C SECTION WH (CUSTOM PROCEDURE TRAY) ×2 IMPLANT
STRIP CLOSURE SKIN 1/2X4 (GAUZE/BANDAGES/DRESSINGS) ×1 IMPLANT
SUT MNCRL 0 VIOLET CTX 36 (SUTURE) ×4 IMPLANT
SUT MNCRL AB 3-0 PS2 27 (SUTURE) IMPLANT
SUT MON AB 2-0 CT1 27 (SUTURE) ×2 IMPLANT
SUT MON AB-0 CT1 36 (SUTURE) ×4 IMPLANT
SUT PLAIN 0 NONE (SUTURE) IMPLANT
SUT PLAIN 2 0 XLH (SUTURE) IMPLANT
SUT PLAIN ABS 2-0 CT1 27XMFL (SUTURE) IMPLANT
SYR CONTROL 10ML LL (SYRINGE) ×2 IMPLANT
TOWEL OR 17X24 6PK STRL BLUE (TOWEL DISPOSABLE) ×2 IMPLANT
TRAY FOLEY W/BAG SLVR 14FR LF (SET/KITS/TRAYS/PACK) ×2 IMPLANT
WATER STERILE IRR 1000ML POUR (IV SOLUTION) ×2 IMPLANT

## 2023-02-21 NOTE — Anesthesia Procedure Notes (Signed)
Spinal  Patient location during procedure: OR Start time: 02/21/2023 10:07 AM End time: 02/21/2023 10:10 AM Reason for block: surgical anesthesia Staffing Performed: anesthesiologist  Anesthesiologist: Beryle Lathe, MD Performed by: Beryle Lathe, MD Authorized by: Beryle Lathe, MD   Preanesthetic Checklist Completed: patient identified, IV checked, risks and benefits discussed, surgical consent, monitors and equipment checked, pre-op evaluation and timeout performed Spinal Block Patient position: sitting Prep: DuraPrep Patient monitoring: heart rate, cardiac monitor, continuous pulse ox and blood pressure Approach: midline Location: L3-4 Injection technique: single-shot Needle Needle type: Pencan  Needle gauge: 24 G Additional Notes Consent was obtained prior to the procedure with all questions answered and concerns addressed. Risks including, but not limited to, bleeding, infection, nerve damage, paralysis, failed block, inadequate analgesia, allergic reaction, high spinal, itching, and headache were discussed and the patient wished to proceed. Functioning IV was confirmed and monitors were applied. Sterile prep and drape, including hand hygiene, mask, and sterile gloves were used. The patient was positioned and the spine was prepped. The skin was anesthetized with lidocaine. Free flow of clear CSF was obtained prior to injecting local anesthetic into the CSF. The spinal needle aspirated freely following injection. The needle was carefully withdrawn. The patient tolerated the procedure well.   Leslye Peer, MD

## 2023-02-21 NOTE — Lactation Note (Signed)
This note was copied from a baby's chart. Lactation Consultation Note  Patient Name: Erin Goodwin Date: 02/21/2023 Age:29 hours Reason for consult: Initial assessment;Early term 37-38.6wks;Infant < 6lbs;Maternal endocrine disorder  P4- MOB reports that infant is nursing well, just sleepy. MOB placed infant on the right breast in the cradle hold and infant latched immediately. Infant had a strong rhythmic suck. MOB requested a pump, so LC set one up with 21 mm flanges. LC encouraged MOB to call for further assistance. LC reviewed feeding infant on cue 8-12x in 24 hrs, not allowing infant to go over 3 hrs without a feeding, CDC milk storage guidelines and LC services handout. LC encouraged MOB to call for further assistance as needed.  Maternal Data Has patient been taught Hand Expression?: Yes Does the patient have breastfeeding experience prior to this delivery?: Yes How long did the patient breastfeed?: 6 months  Feeding Mother's Current Feeding Choice: Breast Milk  LATCH Score Latch: Grasps breast easily, tongue down, lips flanged, rhythmical sucking.  Audible Swallowing: Spontaneous and intermittent  Type of Nipple: Everted at rest and after stimulation  Comfort (Breast/Nipple): Soft / non-tender  Hold (Positioning): No assistance needed to correctly position infant at breast.  LATCH Score: 10   Lactation Tools Discussed/Used Tools: Pump;Flanges Flange Size: 21 Breast pump type: Double-Electric Breast Pump;Manual Pump Education: Setup, frequency, and cleaning;Milk Storage  Interventions Interventions: Breast feeding basics reviewed;DEBP;Education;LC Services brochure  Discharge Discharge Education: Warning signs for feeding baby Pump: DEBP;Hands Free;Personal  Consult Status Consult Status: Follow-up Date: 02/22/23 Follow-up type: In-patient    Dema Severin BS, IBCLC 02/21/2023, 7:03 PM

## 2023-02-21 NOTE — H&P (Signed)
Erin Goodwin is a 27 y.o. female presenting for rpt csection at 38w due to previous csection x 3 and closely spaced pregnanciew.. OB History     Gravida  4   Para  3   Term  3   Preterm      AB      Living  3      SAB      IAB      Ectopic      Multiple  0   Live Births  3          Past Medical History:  Diagnosis Date   Chronic constipation    Chronic posterior anal fissure    External hemorrhoids    Family history of early CAD    Family history of headaches    GERD (gastroesophageal reflux disease)    Hirsutism    History of chicken pox    Hypotension    Migraines    Palpitations    PCOS (polycystic ovarian syndrome)    Past Surgical History:  Procedure Laterality Date   CESAREAN SECTION N/A 06/01/2017   Procedure: CESAREAN SECTION;  Surgeon: Olivia Mackie, MD;  Location: [facility];  Service: Obstetrics;  Laterality: N/A;   CESAREAN SECTION N/A 09/19/2019   Procedure: Repeat CESAREAN SECTION;  Surgeon: Olivia Mackie, MD;  Location: [facility];  Service: Obstetrics;  Laterality: N/A;  EDD: 09/26/19 Tracey RNFA   CESAREAN SECTION N/A 12/08/2021   Procedure: Repeat CESAREAN SECTION;  Surgeon: Olivia Mackie, MD;  Location: [facility];  Service: Obstetrics;  Laterality: N/A;  EDD: 12/13/21   TONSILLECTOMY AND ADENOIDECTOMY     WISDOM TOOTH EXTRACTION     Family History: family history includes ADD / ADHD in her brother; Breast cancer in her maternal grandmother; COPD in her mother; Cancer (age of onset: 69) in her maternal aunt and mother; Diabetes in her maternal grandfather and mother; Heart attack in her father; Heart attack (age of onset: 42) in her mother; Hyperlipidemia in her mother; Irritable bowel syndrome in her mother. Social History:  reports that she has never smoked. She has never used smokeless tobacco. She reports current alcohol use. She reports that she does not use drugs.     Maternal Diabetes: No Genetic Screening:  Normal Maternal Ultrasounds/Referrals: Normal Fetal Ultrasounds or other Referrals:  None Maternal Substance Abuse:  No Significant Maternal Medications:  None Significant Maternal Lab Results:  Group B Strep negative Number of Prenatal Visits:greater than 3 verified prenatal visits Maternal Vaccinations:TDap Other Comments:  None  Review of Systems  Constitutional: Negative.   All other systems reviewed and are negative.  Maternal Medical History:  Reason for admission: Contractions.   Contractions: Onset was more than 2 days ago.   Frequency: rare.   Perceived severity is mild.   Fetal activity: Perceived fetal activity is normal.   Last perceived fetal movement was within the past hour.   Prenatal complications: no prenatal complications Prenatal Complications - Diabetes: none.     Blood pressure 109/74, pulse 80, temperature 98.1 F (36.7 C), temperature source Oral, resp. rate 16, height 5\' 6"  (1.676 m), weight 88.7 kg, SpO2 99%, currently breastfeeding. Maternal Exam:  Uterine Assessment: Contraction strength is mild.  Contraction frequency is rare.  Abdomen: Patient reports no abdominal tenderness. Surgical scars: low transverse.   Fetal presentation: vertex Introitus: Normal vulva. Normal vagina.  Ferning test: not done.  Nitrazine test: not done. Amniotic fluid character: not assessed. Pelvis: questionable for  delivery.   Cervix: Cervix evaluated by digital exam.     Physical Exam Vitals and nursing note reviewed.  Constitutional:      Appearance: Normal appearance. She is normal weight.  HENT:     Head: Normocephalic and atraumatic.  Cardiovascular:     Rate and Rhythm: Normal rate and regular rhythm.     Pulses: Normal pulses.     Heart sounds: Normal heart sounds.  Pulmonary:     Effort: Pulmonary effort is normal.     Breath sounds: Normal breath sounds.  Abdominal:     General: Bowel sounds are normal.     Palpations: Abdomen is soft.   Genitourinary:    General: Normal vulva.  Musculoskeletal:     Cervical back: Normal range of motion and neck supple.  Skin:    General: Skin is warm and dry.  Neurological:     General: No focal deficit present.     Mental Status: She is alert and oriented to person, place, and time.  Psychiatric:        Mood and Affect: Mood normal.        Behavior: Behavior normal.     Prenatal labs: ABO, Rh: --/--/A POS (12/20 0913) Antibody: NEG (12/20 0913) Rubella: Immune, Immune (07/02 0000) RPR: NON REACTIVE (12/20 0913)  HBsAg: Negative (07/02 0000)  HIV: Non-reactive (07/02 0000)  GBS:  neg   Assessment/Plan: Previous csection x 3 Closely spaced pregnancies 38wk IUP for indicated rpt csection(delivery timing discussed with MFM) Risks of infection , bleeding , injury to surrounding organs with need for repair discussed. Consent done   Balthazar Dooly J 02/21/2023, 8:40 AM

## 2023-02-21 NOTE — Anesthesia Postprocedure Evaluation (Signed)
Anesthesia Post Note  Patient: Erin Goodwin  Procedure(s) Performed: Repeat CESAREAN SECTION (Bilateral) BILATERAL TUBAL LIGATION (Bilateral)     Patient location during evaluation: PACU Anesthesia Type: Spinal Level of consciousness: awake and alert Pain management: pain level controlled Vital Signs Assessment: post-procedure vital signs reviewed and stable Respiratory status: spontaneous breathing and respiratory function stable Cardiovascular status: blood pressure returned to baseline and stable Postop Assessment: spinal receding and no apparent nausea or vomiting Anesthetic complications: no   No notable events documented.  Last Vitals:  Vitals:   02/21/23 1200 02/21/23 1215  BP: (!) 95/57 97/61  Pulse: 64 63  Resp: 11 13  Temp:    SpO2: 99% 98%    Last Pain:  Vitals:   02/21/23 1215  TempSrc:   PainSc: 0-No pain   Pain Goal: Patients Stated Pain Goal: 1 (02/21/23 0828)  LLE Motor Response: Purposeful movement (02/21/23 1215) LLE Sensation: Tingling (02/21/23 1215) RLE Motor Response: Purposeful movement (02/21/23 1215) RLE Sensation: Tingling (02/21/23 1215)     Epidural/Spinal Function Cutaneous sensation: Able to Discern Pressure (02/21/23 1215), Patient able to flex knees: No (02/21/23 1215), Patient able to lift hips off bed: No (02/21/23 1215), Back pain beyond tenderness at insertion site: No (02/21/23 1215), Progressively worsening motor and/or sensory loss: No (02/21/23 1215), Bowel and/or bladder incontinence post epidural: No (02/21/23 1215)  Beryle Lathe

## 2023-02-21 NOTE — Anesthesia Preprocedure Evaluation (Addendum)
Anesthesia Evaluation  Patient identified by MRN, date of birth, ID band Patient awake    Reviewed: Allergy & Precautions, NPO status , Patient's Chart, lab work & pertinent test results  History of Anesthesia Complications Negative for: history of anesthetic complications  Airway Mallampati: II   Neck ROM: Full    Dental   Pulmonary neg pulmonary ROS   Pulmonary exam normal        Cardiovascular negative cardio ROS Normal cardiovascular exam     Neuro/Psych  Headaches  negative psych ROS   GI/Hepatic Neg liver ROS,GERD  Controlled and Medicated,,  Endo/Other   "Enlarged thyroid" - in midst of workup, currently no signs of hypo/hyperthyroidism per patient, no meds    Renal/GU negative Renal ROS     Musculoskeletal negative musculoskeletal ROS (+)    Abdominal   Peds  Hematology negative hematology ROS (+)  Plt 403k    Anesthesia Other Findings   Reproductive/Obstetrics (+) Pregnancy  Previous C/S x 3 PCOS                              Anesthesia Physical Anesthesia Plan  ASA: 2  Anesthesia Plan: Spinal   Post-op Pain Management: Tylenol PO (pre-op)*   Induction:   PONV Risk Score and Plan: 2 and Treatment may vary due to age or medical condition, Ondansetron and Scopolamine patch - Pre-op  Airway Management Planned: Natural Airway  Additional Equipment: None  Intra-op Plan:   Post-operative Plan:   Informed Consent: I have reviewed the patients History and Physical, chart, labs and discussed the procedure including the risks, benefits and alternatives for the proposed anesthesia with the patient or authorized representative who has indicated his/her understanding and acceptance.       Plan Discussed with: CRNA and Anesthesiologist  Anesthesia Plan Comments: (Labs reviewed, platelets acceptable. Discussed risks and benefits of spinal, including spinal/epidural  hematoma, infection, failed block, and PDPH. Patient expressed understanding and wished to proceed. )       Anesthesia Quick Evaluation

## 2023-02-21 NOTE — Transfer of Care (Signed)
Immediate Anesthesia Transfer of Care Note  Patient: Erin Goodwin  Procedure(s) Performed: Repeat CESAREAN SECTION (Bilateral) BILATERAL TUBAL LIGATION (Bilateral)  Patient Location: PACU  Anesthesia Type:Spinal  Level of Consciousness: awake, alert , and oriented  Airway & Oxygen Therapy: Patient Spontanous Breathing  Post-op Assessment: Report given to RN and Post -op Vital signs reviewed and stable  Post vital signs: Reviewed and stable  Last Vitals:  Vitals Value Taken Time  BP 96/64 02/21/23 1124  Temp    Pulse 75 02/21/23 1127  Resp 12 02/21/23 1127  SpO2 99 % 02/21/23 1127  Vitals shown include unfiled device data.  Last Pain:  Vitals:   02/21/23 0828  TempSrc: Oral  PainSc: 7       Patients Stated Pain Goal: 1 (02/21/23 5188)  Complications: No notable events documented.

## 2023-02-21 NOTE — Op Note (Signed)
Cesarean Section Procedure Note  Indications: previous uterine incision kerr x3 or greater  Pre-operative Diagnosis: 38 week 0 day pregnancy.  Post-operative Diagnosis: same Omental adhesions to ant abdominal wall LUS window  Surgeon: Lenoard Aden   Assistants: Conni Elliot, MD  Anesthesia: Local anesthesia 0.25.% bupivacaine and Spinal anesthesia  ASA Class: 2  Procedure Details  The patient was seen in the Holding Room. The risks, benefits, complications, treatment options, and expected outcomes were discussed with the patient.  The patient concurred with the proposed plan, giving informed consent. The risks of anesthesia, infection, bleeding and possible injury to other organs discussed. Injury to bowel, bladder, or ureter with possible need for repair discussed. Possible need for transfusion with secondary risks of hepatitis or HIV acquisition discussed. Post operative complications to include but not limited to DVT, PE and Pneumonia noted. The site of surgery properly noted/marked. The patient was taken to Operating Room # C, identified as Erin Goodwin and the procedure verified as C-Section Delivery. A Time Out was held and the above information confirmed.  After induction of anesthesia, the patient was draped and prepped in the usual sterile manner. A Pfannenstiel incision was made and carried down through the subcutaneous tissue to the fascia. Fascial incision was made and extended transversely using Mayo scissors. The fascia was separated from the underlying rectus tissue superiorly and inferiorly. The peritoneum was identified and entered. Peritoneal incision was extended longitudinally. The utero-vesical peritoneal reflection was incised transversely and the bladder flap was bluntly freed from the lower uterine segment. Omental adhesions to ant abdominal wall. LUS window noted.A low transverse uterine incision(Kerr hysterotomy) was made. Delivered from cephalica OA  presentation was a  female with Apgar scores of 9 at one minute and 9 at five minutes. Bulb suctioning gently performed. Neonatal team in attendance.After the umbilical cord was clamped and cut cord blood was obtained for evaluation. The placenta was removed intact and appeared normal. The uterus was curetted with a dry lap pack. Good hemostasis was noted.The uterine outline, tubes and ovaries appeared normal. The uterine incision was closed with running locked sutures of 0 Monocryl x 1 layers. Hemostasis was observed. Bilateral total salpingectomy with Ligasure. Tubes removed. Omental adhesions lysed with ligasure. The fascia was then reapproximated with running sutures of 0 Monocryl. Interrupted sutures in midline.The skin was undermined to free [place] scar and the [place] layer closed with 2-0 plain in running fashion the skin reapproximated with 3-0 monocryl.  Instrument, sponge, and needle counts were correct prior the abdominal closure and at the conclusion of the case.   Findings: As noted  Estimated Blood Loss:  300 mL         Drains: foley                 Specimens: placenta and tubal segments.                 Complications:  None; patient tolerated the procedure well.         Disposition: PACU - hemodynamically stable.         Condition: stable  Attending Attestation: I performed the procedure.

## 2023-02-22 LAB — CBC
HCT: 27.1 % — ABNORMAL LOW (ref 36.0–46.0)
Hemoglobin: 8.7 g/dL — ABNORMAL LOW (ref 12.0–15.0)
MCH: 29.7 pg (ref 26.0–34.0)
MCHC: 32.1 g/dL (ref 30.0–36.0)
MCV: 92.5 fL (ref 80.0–100.0)
Platelets: 295 10*3/uL (ref 150–400)
RBC: 2.93 MIL/uL — ABNORMAL LOW (ref 3.87–5.11)
RDW: 13.2 % (ref 11.5–15.5)
WBC: 13.1 10*3/uL — ABNORMAL HIGH (ref 4.0–10.5)
nRBC: 0 % (ref 0.0–0.2)

## 2023-02-22 NOTE — Progress Notes (Signed)
No c/o; pain controlled, nml lochia Voids w/o difficulty; no lightheadedness/dizziness +flatus, ambulating; tol po Breastfeeding  Patient Vitals for the past 24 hrs:  BP Temp Temp src Pulse Resp SpO2  02/22/23 0603 (!) 87/49 98.2 F (36.8 C) Axillary (!) 52 -- 99 %  02/22/23 0130 95/60 98 F (36.7 C) Oral (!) 51 -- 100 %  02/21/23 2021 (!) 96/52 98.5 F (36.9 C) -- (!) 54 -- 99 %  02/21/23 1636 -- 98.2 F (36.8 C) -- -- 16 --  02/21/23 1530 (!) 99/56 97.6 F (36.4 C) -- 62 16 100 %  02/21/23 1420 98/60 98.3 F (36.8 C) Axillary 63 -- 100 %  02/21/23 1304 95/62 (!) 97.5 F (36.4 C) Oral (!) 57 -- 100 %  02/21/23 1245 (!) 96/58 -- -- 64 10 99 %  02/21/23 1230 (!) 93/56 -- -- 60 15 99 %  02/21/23 1215 97/61 -- -- 63 13 98 %  02/21/23 1200 (!) 95/57 -- -- 64 11 99 %  02/21/23 1145 95/61 (!) 97.5 F (36.4 C) Axillary 67 19 98 %  02/21/23 1130 93/65 -- -- 68 14 98 %  02/21/23 1124 96/64 (!) 96.1 F (35.6 C) Axillary 66 19 99 %   Intake/Output Summary (Last 24 hours) at 02/22/2023 0937 Last data filed at 02/22/2023 0830 Gross per 24 hour  Intake 4030 ml  Output 3082 ml  Net 948 ml   A&ox3 Rrr Ctab Abd: soft,nt,nd, +bs; fundus firm and below umb; dressing: c/d/I LE: No edema,nt bilat     Latest Ref Rng & Units 02/22/2023    4:19 AM 02/19/2023    9:13 AM 12/09/2021    6:03 AM  CBC  WBC 4.0 - 10.5 K/uL 13.1  9.8  15.9   Hemoglobin 12.0 - 15.0 g/dL 8.7  84.6  7.6   Hematocrit 36.0 - 46.0 % 27.1  36.3  23.2   Platelets 150 - 400 K/uL 295  403  361    A/P pod1 s/p rltcs Doing well, contin care Acute anemia d/t blood loss - asymptomatic but sig.; plan iron daily pp; borderline hypotension - encourage increased water intake, and follow  RH pos RI

## 2023-02-23 LAB — SURGICAL PATHOLOGY

## 2023-02-23 MED ORDER — ACETAMINOPHEN 500 MG PO TABS: 1000.0000 mg | ORAL_TABLET | Freq: Three times a day (TID) | ORAL | 0 refills | Status: AC | PRN

## 2023-02-23 MED ORDER — OXYCODONE HCL 5 MG PO TABS: 5.0000 mg | ORAL_TABLET | ORAL | 0 refills | Status: DC | PRN

## 2023-02-23 MED ORDER — POLYSACCHARIDE IRON COMPLEX 150 MG PO CAPS
150.0000 mg | ORAL_CAPSULE | Freq: Every day | ORAL | Status: DC
Start: 2023-02-23 — End: 2023-02-23
  Administered 2023-02-23: 150 mg via ORAL
  Filled 2023-02-23: qty 1

## 2023-02-23 MED ORDER — IBUPROFEN 600 MG PO TABS: 600.0000 mg | ORAL_TABLET | Freq: Four times a day (QID) | ORAL | 0 refills | Status: AC

## 2023-02-23 MED ORDER — POLYSACCHARIDE IRON COMPLEX 150 MG PO CAPS: 150.0000 mg | ORAL_CAPSULE | Freq: Every day | ORAL | Status: DC

## 2023-02-23 NOTE — Progress Notes (Signed)
No c/o; pain controlled, nml lochia Voids w/o difficulty; no lightheadedness/dizziness +flatus, ambulating; tol po Breastfeeding  Patient Vitals for the past 24 hrs:  BP Temp Temp src Pulse Resp SpO2  02/23/23 0452 104/71 98.1 F (36.7 C) Oral 66 18 99 %  02/22/23 2128 101/67 98.4 F (36.9 C) Oral 66 17 97 %  02/22/23 1356 97/62 -- Oral (!) 49 16 100 %  No intake or output data in the 24 hours ending 02/23/23 1044  A&ox3 Rrr Ctab Abd: soft,nt,nd, +bs; fundus firm and below umb; dressing: c/d/I LE: No edema,nt bilat     Latest Ref Rng & Units 02/22/2023    4:19 AM 02/19/2023    9:13 AM 12/09/2021    6:03 AM  CBC  WBC 4.0 - 10.5 K/uL 13.1  9.8  15.9   Hemoglobin 12.0 - 15.0 g/dL 8.7  29.5  7.6   Hematocrit 36.0 - 46.0 % 27.1  36.3  23.2   Platelets 150 - 400 K/uL 295  403  361    A/P pod 2  s/p rltcs Doing well, contin care.  Acute anemia d/t blood loss - asymptomatic but sig.; plan iron daily RH pos RI Ok for discharge if baby going home otherwise cancel mom's discharge. [place] f/up with Dr Billy Coast at 6 weeks, sooner as needed. PP care and post op care/ warning ss dw pt

## 2023-02-23 NOTE — Discharge Summary (Signed)
Postpartum Discharge Summary    Patient Name: Erin Goodwin DOB: 20-Apr-1995 MRN: [id number]  Date of admission: 02/21/2023 Delivery date:02/21/2023 Delivering provider: Olivia Mackie Date of discharge: 02/23/2023  Admitting diagnosis: Previous cesarean delivery affecting pregnancy [O34.219] Intrauterine pregnancy: [redacted]w[redacted]d     Secondary diagnosis: permanent sterilization request      Discharge diagnosis: Term Pregnancy Delivered           Abdominal adhesions, uterine lower segment window in prior C/s scar                                     Post partum procedures: None Augmentation: N/A Complications: None  Hospital course: Sceduled C/S   27 y.o. yo [id number] at [redacted]w[redacted]d was admitted to the hospital 02/21/2023 for scheduled cesarean section with the following indication:Elective Repeat.Delivery details are as follows:  Delivery Method:C-Section, Low Transverse Operative Delivery:N/A Details of operation can be found in separate operative note.  Patient had a postpartum course complicated by none.  She is ambulating, tolerating a regular diet, passing flatus, and urinating well. Patient is discharged home in stable condition on  02/23/23        Newborn Data: Birth date:02/21/2023 Birth time:10:31 AM Gender:Female Living status:Living Apgars:9 ,10  Weight:2700 g    Magnesium Sulfate received: No BMZ received: No Rhophylac:N/A MMR:No T-DaP:Given prenatally Flu: No RSV Vaccine received: Yes Transfusion:No Immunizations administered: Immunization History  Administered Date(s) Administered   DTaP 01/19/1994, 03/24/1994, 06/04/1994, 11/23/1994, 12/06/1997   HIB (PRP-OMP) 01/19/1994, 03/24/1994, 06/04/1994, 11/23/1994   HPV Quadrivalent 03/03/2003, 03/02/2004, 07/10/2005, 09/11/2005, 10/04/2007   Hepatitis A 10/04/2007, 06/13/2009   Hepatitis B Nov 16, 1993, 01/19/1994, 06/04/1994   IPV 01/19/1994, 03/24/1994, 06/04/1994, 11/23/1994   Influenza,inj,Quad PF,6+ Mos  12/19/2015, 11/12/2017   Influenza-Unspecified 12/01/2002, 06/13/2009, 12/10/2019   MMR 11/23/1994, 06/13/2009   Meningococcal Conjugate 09/11/2005   Moderna Sars-Covid-2 Vaccination 11/01/2019, 11/29/2019   PPD Test 09/14/2016, 07/19/2017   Td 07/10/2005   Tdap 07/24/2015, 07/04/2019, 01/02/2021, 09/30/2021   Varicella 10/04/2007, 06/13/2009    Physical exam  Vitals:   02/22/23 0603 02/22/23 1356 02/22/23 2128 02/23/23 0452  BP: (!) 87/49 97/62 101/67 104/71  Pulse: (!) 52 (!) 49 66 66  Resp:  16 17 18   Temp: 98.2 F (36.8 C)  98.4 F (36.9 C) 98.1 F (36.7 C)  TempSrc: Axillary Oral Oral Oral  SpO2: 99% 100% 97% 99%  Weight:      Height:       General: alert, cooperative, and no distress Lochia: appropriate Uterine Fundus: firm Incision: Healing well with no significant drainage, No significant erythema DVT Evaluation: No evidence of DVT seen on physical exam. Negative Homan's sign. Labs: Lab Results  Component Value Date   WBC 13.1 (H) 02/22/2023   HGB 8.7 (L) 02/22/2023   HCT 27.1 (L) 02/22/2023   MCV 92.5 02/22/2023   PLT 295 02/22/2023      Latest Ref Rng & Units 04/24/2020    1:36 PM  CMP  Glucose 65 - 99 mg/dL 84   BUN 6 - 20 mg/dL 9   Creatinine 7.82 - 9.56 mg/dL 2.13   Sodium 086 - 578 mmol/L 139   Potassium 3.5 - 5.2 mmol/L 4.4   Chloride 96 - 106 mmol/L 102   CO2 20 - 29 mmol/L 23   Calcium 8.7 - 10.2 mg/dL 9.4   Total Protein 6.0 - 8.5 g/dL 7.1   Total Bilirubin  0.0 - 1.2 mg/dL <0.9   Alkaline Phos 44 - 121 IU/L 71   AST 0 - 40 IU/L 17   ALT 0 - 32 IU/L 16    Edinburgh Score:    02/22/2023    2:19 PM  Edinburgh Postnatal Depression Scale Screening Tool  I have been able to laugh and see the funny side of things. 0  I have looked forward with enjoyment to things. 0  I have blamed myself unnecessarily when things went wrong. 1  I have been anxious or worried for no good reason. 1  I have felt scared or panicky for no good reason. 0   Things have been getting on top of me. 1  I have been so unhappy that I have had difficulty sleeping. 0  I have felt sad or miserable. 1  I have been so unhappy that I have been crying. 0  The thought of harming myself has occurred to me. 0  Edinburgh Postnatal Depression Scale Total 4      After visit meds:  Allergies as of 02/23/2023   No Known Allergies      Medication List     TAKE these medications    acetaminophen 500 MG tablet Commonly known as: TYLENOL Take 2 tablets (1,000 mg total) by mouth every 6 (six) hours. What changed: Another medication with the same name was added. Make sure you understand how and when to take each.   acetaminophen 500 MG tablet Commonly known as: TYLENOL Take 2 tablets (1,000 mg total) by mouth every 8 (eight) hours as needed. What changed: You were already taking a medication with the same name, and this prescription was added. Make sure you understand how and when to take each.   esomeprazole 20 MG capsule Commonly known as: NEXIUM Take 20 mg by mouth daily as needed (acid reflux).   ibuprofen 600 MG tablet Commonly known as: ADVIL Take 1 tablet (600 mg total) by mouth every 6 (six) hours.   iron polysaccharides 150 MG capsule Commonly known as: NIFEREX Take 1 capsule (150 mg total) by mouth daily.   oxyCODONE 5 MG immediate release tablet Commonly known as: Oxy IR/ROXICODONE Take 1-2 tablets (5-10 mg total) by mouth every 4 (four) hours as needed for moderate pain (pain score 4-6).               Discharge Care Instructions  (From admission, onward)           Start     Ordered   02/23/23 0000  Discharge wound care:       Comments: Remove honeycomb dressing when baby turns 18 days old   02/23/23 1051             Discharge home in stable condition Infant Feeding: Bottle and Breast Infant Disposition:home with mother Discharge instruction: per After Visit Summary and Postpartum booklet. Activity: Advance  as tolerated. Pelvic rest for 6 weeks.  Diet: routine diet Anticipated Birth Control: B/L salpingectomy at C/section Postpartum Appointment:6 weeks Additional Postpartum F/U:  sooner as needed  Future Appointments:No future appointments.  Patient will call  Follow up Visit:  Follow-up Information     Olivia Mackie, MD Follow up in 6 week(s).   Specialty: Obstetrics and Gynecology Why: sooner as needed Contact information: [address] [place] [address] [phone]                     02/23/2023 Robley Fries, MD

## 2023-03-02 ENCOUNTER — Telehealth (HOSPITAL_COMMUNITY): Payer: Self-pay

## 2023-03-02 NOTE — Telephone Encounter (Signed)
 03/02/2023 1024  Name: Erin Goodwin MRN: [id number] DOB: 1995-04-15  Reason for Call:  Transition of Care Hospital Discharge Call  Contact Status: Patient Contact Status: Message  Language assistant needed:          Follow-Up Questions:    [place] Postnatal Depression Scale:  In the Past 7 Days:    PHQ2-9 Depression Scale:     Discharge Follow-up:    Post-discharge interventions: NA  Signature  Rosaline Deretha PEAK

## 2023-03-22 ENCOUNTER — Ambulatory Visit: Payer: 59 | Admitting: Nurse Practitioner

## 2023-03-26 ENCOUNTER — Ambulatory Visit (INDEPENDENT_AMBULATORY_CARE_PROVIDER_SITE_OTHER): Payer: 59 | Admitting: Family Medicine

## 2023-03-26 ENCOUNTER — Ambulatory Visit (INDEPENDENT_AMBULATORY_CARE_PROVIDER_SITE_OTHER): Payer: 59

## 2023-03-26 ENCOUNTER — Encounter: Payer: Self-pay | Admitting: Family Medicine

## 2023-03-26 VITALS — BP 112/75 | HR 70 | Temp 97.6°F | Ht 66.0 in | Wt 187.8 lb

## 2023-03-26 DIAGNOSIS — K219 Gastro-esophageal reflux disease without esophagitis: Secondary | ICD-10-CM

## 2023-03-26 DIAGNOSIS — Z136 Encounter for screening for cardiovascular disorders: Secondary | ICD-10-CM

## 2023-03-26 DIAGNOSIS — M545 Low back pain, unspecified: Secondary | ICD-10-CM | POA: Diagnosis not present

## 2023-03-26 DIAGNOSIS — Z0001 Encounter for general adult medical examination with abnormal findings: Secondary | ICD-10-CM | POA: Diagnosis not present

## 2023-03-26 DIAGNOSIS — M25551 Pain in right hip: Secondary | ICD-10-CM

## 2023-03-26 DIAGNOSIS — Z0000 Encounter for general adult medical examination without abnormal findings: Secondary | ICD-10-CM

## 2023-03-26 DIAGNOSIS — O9081 Anemia of the puerperium: Secondary | ICD-10-CM | POA: Diagnosis not present

## 2023-03-26 DIAGNOSIS — M25552 Pain in left hip: Secondary | ICD-10-CM

## 2023-03-26 DIAGNOSIS — E049 Nontoxic goiter, unspecified: Secondary | ICD-10-CM | POA: Diagnosis not present

## 2023-03-26 DIAGNOSIS — G8929 Other chronic pain: Secondary | ICD-10-CM | POA: Diagnosis not present

## 2023-03-26 LAB — LIPID PANEL
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
## 2023-03-27 LAB — CMP14+EGFR
ALT: 11 IU/L (ref 0–32)
AST: 15 IU/L (ref 0–40)
Albumin: 4.3 g/dL (ref 4.0–5.0)
Alkaline Phosphatase: 87 IU/L (ref 44–121)
BUN/Creatinine Ratio: 14 (ref 9–23)
BUN: 12 mg/dL (ref 6–20)
CO2: 25 mmol/L (ref 20–29)
Calcium: 9.5 mg/dL (ref 8.7–10.2)
Chloride: 102 mmol/L (ref 96–106)
Creatinine, Ser: 0.87 mg/dL (ref 0.57–1.00)
Globulin, Total: 2.9 g/dL (ref 1.5–4.5)
Glucose: 78 mg/dL (ref 70–99)
Potassium: 4.6 mmol/L (ref 3.5–5.2)
Sodium: 140 mmol/L (ref 134–144)
Total Protein: 7.2 g/dL (ref 6.0–8.5)
eGFR: 92 mL/min/{1.73_m2} (ref >59)

## 2023-03-27 LAB — ANEMIA PROFILE B
Basophils Absolute: 0.1 10*3/uL (ref 0.0–0.2)
Basos: 1 %
EOS (ABSOLUTE): 0 10*3/uL (ref 0.0–0.4)
Eos: 0 %
Ferritin: 59 ng/mL (ref 15–150)
Folate: 4.5 ng/mL (ref >3.0)
Hematocrit: 39.8 % (ref 34.0–46.6)
Hemoglobin: 13.2 g/dL (ref 11.1–15.9)
Immature Grans (Abs): 0 10*3/uL (ref 0.0–0.1)
Immature Granulocytes: 0 %
Iron Saturation: 20 % (ref 15–55)
Iron: 68 ug/dL (ref 27–159)
Lymphocytes Absolute: 3.1 10*3/uL (ref 0.7–3.1)
Lymphs: 44 %
MCH: 30.5 pg (ref 26.6–33.0)
MCHC: 33.2 g/dL (ref 31.5–35.7)
MCV: 92 fL (ref 79–97)
Monocytes Absolute: 0.5 10*3/uL (ref 0.1–0.9)
Monocytes: 7 %
Neutrophils Absolute: 3.3 10*3/uL (ref 1.4–7.0)
Neutrophils: 48 %
Platelets: 316 10*3/uL (ref 150–450)
RBC: 4.33 x10E6/uL (ref 3.77–5.28)
RDW: 13.3 % (ref 11.7–15.4)
Retic Ct Pct: 1.4 % (ref 0.6–2.6)
Total Iron Binding Capacity: 345 ug/dL (ref 250–450)
UIBC: 277 ug/dL (ref 131–425)
Vitamin B-12: 540 pg/mL (ref 232–1245)
WBC: 7 10*3/uL (ref 3.4–10.8)

## 2023-03-27 LAB — THYROID PANEL WITH TSH
Free Thyroxine Index: 2.3 (ref 1.2–4.9)
T3 Uptake Ratio: 27 % (ref 24–39)
T4, Total: 8.5 ug/dL (ref 4.5–12.0)
TSH: 1.63 u[IU]/mL (ref 0.450–4.500)

## 2023-03-27 LAB — LIPID PANEL
Cholesterol, Total: 213 mg/dL — ABNORMAL HIGH (ref 100–199)
HDL: 49 mg/dL (ref >39)
LDL CALC COMMENT:: 4.3 ratio (ref 0.0–4.4)
LDL Chol Calc (NIH): 150 mg/dL — ABNORMAL HIGH (ref 0–99)
Triglycerides: 80 mg/dL (ref 0–149)
VLDL Cholesterol Cal: 14 mg/dL (ref 5–40)

## 2023-03-27 LAB — THYROID ANTIBODIES: Thyroperoxidase Ab SerPl-aCnc: 12 [IU]/mL (ref 0–34)

## 2023-03-27 LAB — HEPATITIS C ANTIBODY

## 2023-03-29 ENCOUNTER — Encounter: Payer: Self-pay | Admitting: Family Medicine

## 2023-04-01 ENCOUNTER — Ambulatory Visit (HOSPITAL_COMMUNITY)
Admission: RE | Admit: 2023-04-01 | Discharge: 2023-04-01 | Disposition: A | Discharge status: Home / Self Care | Payer: 59 | Source: Ambulatory Visit | Attending: Family Medicine | Admitting: Family Medicine

## 2023-04-01 DIAGNOSIS — E049 Nontoxic goiter, unspecified: Secondary | ICD-10-CM | POA: Diagnosis not present

## 2023-04-05 ENCOUNTER — Encounter: Payer: Self-pay | Admitting: Family Medicine

## 2023-04-06 DIAGNOSIS — Z124 Encounter for screening for malignant neoplasm of cervix: Secondary | ICD-10-CM | POA: Diagnosis not present

## 2023-07-27 ENCOUNTER — Ambulatory Visit: Payer: Self-pay

## 2023-07-27 ENCOUNTER — Ambulatory Visit (INDEPENDENT_AMBULATORY_CARE_PROVIDER_SITE_OTHER): Admitting: Nurse Practitioner

## 2023-07-27 ENCOUNTER — Encounter: Payer: Self-pay | Admitting: Nurse Practitioner

## 2023-07-27 VITALS — BP 96/65 | HR 55 | Temp 98.0°F | Ht 66.0 in | Wt 177.2 lb

## 2023-07-27 DIAGNOSIS — H60312 Diffuse otitis externa, left ear: Secondary | ICD-10-CM

## 2023-07-27 MED ORDER — CEFDINIR 300 MG PO CAPS
300.0000 mg | ORAL_CAPSULE | Freq: Two times a day (BID) | ORAL | 0 refills | Status: AC
Start: 2023-07-27 — End: ?

## 2023-07-27 NOTE — Telephone Encounter (Signed)
 Copied from CRM [id number]. Topic: Clinical - Red Word Triage >> Jul 27, 2023  8:08 AM Blair Bumpers wrote: Red Word that prompted transfer to Nurse Triage: Patient states she now has hearing loss in her left hear. States it has been hurting for a while but the hearing loss started last week. Wants to make an appt.  Chief Complaint: hearing loss left ear.  Symptoms: having a difficult time hearing low tones Frequency: comes and goes Pertinent Negatives: Patient denies headache, runny nose, cough Disposition: [] ED /[] Urgent Care (no appt availability in office) / [x] Appointment(In office/virtual)/ []  [place] [facility]/ [] Home Care/ [] Refused Recommended Disposition /[] [place] [facility]/ []  Follow-up with PCP Additional Notes: apt made for today; care advice given, denies questions; instructed to go to ER if becomes worse.   Reason for Disposition  [1] SEVERE ear pain AND [2] not improved 2 hours after ibuprofen   Answer Assessment - Initial Assessment Questions 1. DESCRIPTION: "What type of hearing problem are you having? Describe it for me." (e.g., complete hearing loss, partial loss)     Left ear, loss of hearing 2. LOCATION: "One or both ears?" If one, ask: "Which ear?"     Left ear 3. SEVERITY: "Can you hear anything?" If Yes, ask: "What can you hear?" (e.g., ticking watch, whisper, talking)   - MILD:  Difficulty hearing soft speech, quiet library sounds, or speech from a distance or over background noise.   - MODERATE: Difficulty hearing normal speech even at closed distances.   - SEVERE: Unable to hear most normal conversation and talking; only able to hear loud sounds such as an alarm clock.     Has difficulty hearing low tones.  4. ONSET: "When did this begin?" "Did it start suddenly or come on gradually?"     Started last week 5. PATTERN: "Does this come and go, or has it been constant since it started?"     Comes and goes. 6. PAIN: "Is there any pain in your ear(s)?"  (Scale  1-10; or mild, moderate, severe)   - NONE (0): no pain   - MILD (1-3): doesn't interfere with normal activities    - MODERATE (4-7): interferes with normal activities or awakens from sleep    - SEVERE (8-10): excruciating pain, unable to do any normal activities      Yes, comes and goes 7. CAUSE: "What do you think is causing this hearing problem?"     unknown 8. OTHER SYMPTOMS: "Do you have any other symptoms?" (e.g., dizziness, ringing in ears)     Ringing in ears 9. PREGNANCY: "Is there any chance you are pregnant?" "When was your last menstrual period?"     na  Protocols used: Hearing Loss or Change-A-AH

## 2023-07-27 NOTE — Progress Notes (Signed)
 Acute Office Visit  Subjective:     Patient ID: Erin Goodwin, female    DOB: 1995/12/09, 28 y.o.   MRN: [id number]  Chief Complaint  Patient presents with   Ear Pain    Started having left ear pain and drainage, now having difficulty with hearing for a couple months    HPI Erin Goodwin is a 28 yrs old female presents 07/27/2023 fro an acute  Otitis Externa: Patient presents with left ear pain that has been going on for 30-months now "Started having left ear pain and drainage, now having difficulty with hearing for a couple months" Patient presents with pain, drainage of pus, pressure, decreased hearing.  Symptoms have been present for 3 minutes.  During that time there has been pus draining out of the. Previous treatment: none. There does not have been some improvement with current treatment.  There does not have been a recent history of swimming.   Active Ambulatory Problems    Diagnosis Date Noted   Status post repeat low transverse cesarean section 12/08/2021   Previous cesarean delivery affecting pregnancy 12/08/2021   Previous cesarean section 02/21/2023   Postpartum anemia 03/26/2023   GERD (gastroesophageal reflux disease) 03/26/2023   Resolved Ambulatory Problems    Diagnosis Date Noted   Visit for preventive health examination 06/03/2015   Episodic lightheadedness 06/03/2015   Family history of early CAD 06/03/2015   Chronic knee pain 06/03/2015   Arterial hypotension 07/01/2015   Borderline low blood pressure determined by examination 09/17/2015   Hirsutism 09/17/2015   Palpitations 02/26/2016   Indication for care in labor and delivery, antepartum 06/01/2017   Encounter for planned induction of labor 06/01/2017   Arrested active phase of labor 06/01/2017   Cesarean delivery delivered: arrest of dilation  06/02/2017   Postpartum care following cesarean delivery (4/2) 06/02/2017   Postoperative urinary retention 06/02/2017   Need for  vaccination to prevent tuberculosis 07/19/2017   Eustachian tube dysfunction, bilateral 07/19/2017   Previous cesarean delivery affecting pregnancy 09/19/2019   Previous cesarean section 09/19/2019   Cesarean delivery delivered 7/20 09/20/2019   Postpartum care following cesarean delivery 7/20 09/20/2019   Acute blood loss anemia 09/21/2019   Previous cesarean section 12/08/2021   Postpartum care following cesarean delivery 10/9 12/08/2021   Surrogate pregnancy 12/08/2021   Iron  deficiency anemia of pregnancy 12/09/2021   Past Medical History:  Diagnosis Date   Chronic constipation    Chronic posterior anal fissure    External hemorrhoids    Family history of headaches    History of chicken pox    Hypotension    Migraines    PCOS (polycystic ovarian syndrome)     Review of Systems  Constitutional:  Negative for chills and fever.  HENT:  Positive for ear discharge and hearing loss.        Left ear  Respiratory:  Negative for cough, shortness of breath and wheezing.   Cardiovascular:  Negative for chest pain, palpitations and leg swelling.  Gastrointestinal:  Negative for diarrhea, nausea and vomiting.  Skin:  Negative for itching and rash.  Neurological:  Positive for dizziness. Negative for headaches.   Negative unless indicated in HPI    Objective:    BP 96/65   Pulse (!) 55   Temp 98 F (36.7 C) (Temporal)   Ht 5\' 6"  (1.676 m)   Wt 177 lb 3.2 oz (80.4 kg)   SpO2 100%   BMI 28.60 kg/m  BP Readings from Last  3 Encounters:  07/27/23 96/65  03/26/23 112/75  02/23/23 104/71   Wt Readings from Last 3 Encounters:  07/27/23 177 lb 3.2 oz (80.4 kg)  03/26/23 187 lb 12.8 oz (85.2 kg)  02/21/23 195 lb 8.8 oz (88.7 kg)      Physical Exam Vitals and nursing note reviewed.  Constitutional:      General: She is not in acute distress. HENT:     Head: Normocephalic and atraumatic.     Right Ear: Hearing, tympanic membrane, ear canal and external ear normal.     Left  Ear: Decreased hearing noted. Drainage, swelling and tenderness present. Tympanic membrane has decreased mobility.     Nose: Nose normal.     Mouth/Throat:     Mouth: Mucous membranes are moist.  Eyes:     General: No scleral icterus.    Extraocular Movements: Extraocular movements intact.     Conjunctiva/sclera: Conjunctivae normal.     Pupils: Pupils are equal, round, and reactive to light.  Cardiovascular:     Heart sounds: Normal heart sounds.  Pulmonary:     Effort: Pulmonary effort is normal.     Breath sounds: Normal breath sounds.  Musculoskeletal:        General: Normal range of motion.     Cervical back: Normal range of motion.     Right lower leg: No edema.     Left lower leg: No edema.  Skin:    General: Skin is warm and dry.     Findings: No rash.  Neurological:     Mental Status: She is alert and oriented to person, place, and time.  Psychiatric:        Mood and Affect: Mood normal.        Behavior: Behavior normal.        Thought Content: Thought content normal.        Judgment: Judgment normal.     No results found for any visits on 07/27/23.      Assessment & Plan:  Diffuse otitis externa of left ear, unspecified chronicity -     Cefdinir; Take 1 capsule (300 mg total) by mouth 2 (two) times daily.  Dispense: 20 capsule; Refill: 0   Erin Goodwin is a 28 yrs old female seen today fro otitis externa, no acute distress Otitis media: Cefdinir 300 mg BID for 10 days, keep ear dry Future: possible referral to ENT   The above assessment and management plan was discussed with the patient. The patient verbalized understanding of and has agreed to the management plan. Patient is aware to call the clinic if they develop any new symptoms or if symptoms persist or worsen. Patient is aware when to return to the clinic for a follow-up visit. Patient educated on when it is appropriate to go to the emergency department.  Return if symptoms worsen or fail to improve.  Erin Nester St  Louis Thompson, DNP [facility] [address] [place], [place] [address] [phone]  Note: This document was prepared by [employer] voice dictation technology and any errors that results from this process are unintentional.

## 2023-08-09 ENCOUNTER — Encounter: Payer: Self-pay | Admitting: Family Medicine

## 2023-08-09 DIAGNOSIS — H918X3 Other specified hearing loss, bilateral: Secondary | ICD-10-CM

## 2023-08-09 DIAGNOSIS — H60312 Diffuse otitis externa, left ear: Secondary | ICD-10-CM

## 2023-09-08 DIAGNOSIS — N912 Amenorrhea, unspecified: Secondary | ICD-10-CM | POA: Diagnosis not present

## 2023-09-08 DIAGNOSIS — E282 Polycystic ovarian syndrome: Secondary | ICD-10-CM | POA: Diagnosis not present

## 2023-10-22 ENCOUNTER — Institutional Professional Consult (permissible substitution) (INDEPENDENT_AMBULATORY_CARE_PROVIDER_SITE_OTHER): Admitting: Otolaryngology

## 2023-10-22 ENCOUNTER — Ambulatory Visit (INDEPENDENT_AMBULATORY_CARE_PROVIDER_SITE_OTHER): Admitting: Audiology

## 2023-12-22 DIAGNOSIS — M9901 Segmental and somatic dysfunction of cervical region: Secondary | ICD-10-CM | POA: Diagnosis not present

## 2023-12-22 DIAGNOSIS — M62830 Muscle spasm of back: Secondary | ICD-10-CM | POA: Diagnosis not present

## 2023-12-22 DIAGNOSIS — M9902 Segmental and somatic dysfunction of thoracic region: Secondary | ICD-10-CM | POA: Diagnosis not present

## 2023-12-22 DIAGNOSIS — M9903 Segmental and somatic dysfunction of lumbar region: Secondary | ICD-10-CM | POA: Diagnosis not present

## 2023-12-23 DIAGNOSIS — M62830 Muscle spasm of back: Secondary | ICD-10-CM | POA: Diagnosis not present

## 2023-12-23 DIAGNOSIS — M9903 Segmental and somatic dysfunction of lumbar region: Secondary | ICD-10-CM | POA: Diagnosis not present

## 2023-12-23 DIAGNOSIS — M9901 Segmental and somatic dysfunction of cervical region: Secondary | ICD-10-CM | POA: Diagnosis not present

## 2023-12-23 DIAGNOSIS — M9902 Segmental and somatic dysfunction of thoracic region: Secondary | ICD-10-CM | POA: Diagnosis not present

## 2023-12-28 ENCOUNTER — Encounter (INDEPENDENT_AMBULATORY_CARE_PROVIDER_SITE_OTHER): Payer: Self-pay | Admitting: Physician Assistant

## 2023-12-28 ENCOUNTER — Ambulatory Visit (INDEPENDENT_AMBULATORY_CARE_PROVIDER_SITE_OTHER): Admitting: Physician Assistant

## 2023-12-28 VITALS — BP 122/79 | HR 73 | Temp 98.0°F | Ht 65.5 in | Wt 175.0 lb

## 2023-12-28 DIAGNOSIS — H9393 Unspecified disorder of ear, bilateral: Secondary | ICD-10-CM

## 2023-12-28 DIAGNOSIS — H9192 Unspecified hearing loss, left ear: Secondary | ICD-10-CM | POA: Diagnosis not present

## 2023-12-28 DIAGNOSIS — H6122 Impacted cerumen, left ear: Secondary | ICD-10-CM | POA: Diagnosis not present

## 2023-12-28 DIAGNOSIS — H9313 Tinnitus, bilateral: Secondary | ICD-10-CM | POA: Diagnosis not present

## 2023-12-28 NOTE — Progress Notes (Addendum)
 Dear Dr. Severa, Here is my assessment for our mutual patient, Erin Goodwin. Thank you for allowing me the opportunity to care for your patient. Please do not hesitate to contact me should you have any other questions. Sincerely, Chyrl Cohen PA-C  Otolaryngology Clinic Note Referring provider: Dr. Severa HPI:  Erin Goodwin is a 28 y.o. female kindly referred by Dr. Severa   Discussed the use of AI scribe software for clinical note transcription with the patient, who gave verbal consent to proceed.  History of Present Illness   Erin Goodwin is a 28 year old female who presents with bilateral ear drainage and headaches.  She has been experiencing hearing loss in her left ear since May, initially accompanied by flu-like symptoms and ear drainage. Despite treatment for an ear infection, her symptoms did not improve. By August, her symptoms seemed to improve, leading her to cancel a scheduled appointment. However, she has been experiencing severe headaches for several weeks, with throbbing in both ears and persistent drainage.  Initially, only the left ear was affected, but now both ears are involved. The headaches are triggered by any level of noise, making them difficult to alleviate. She has attempted various treatments, including chiropractic care, without relief. Ear symptoms include occasional ringing in both ears, described as a high-pitched ring, and occasional clicking or popping sounds.  No history of recurrent ear infections or hearing problems during childhood. She occasionally uses Q-tips around the edge of her ears but denies any significant water  exposure or use of other objects in her ears. She has not sought further medical attention for the drainage since the initial visit.  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 She recently left her job at [employer] to care for her children full-time. No smoking.  Tonsils removed in kindergarten.        Independent Review of Additional Tests or Records:  None   PMH/Meds/All/SocHx/FamHx/ROS:   Past Medical History:  Diagnosis Date   Chronic constipation    Chronic posterior anal fissure    External hemorrhoids    Family history of early CAD    Family history of headaches    GERD (gastroesophageal reflux disease)    Hirsutism    History of chicken pox    Hypotension    Migraines    Palpitations    PCOS (polycystic ovarian syndrome)      Past Surgical History:  Procedure Laterality Date   CESAREAN SECTION N/A 06/01/2017   Procedure: CESAREAN SECTION;  Surgeon: Gorge Ade, MD;  [facility]: [facility];  Service: Obstetrics;  Laterality: N/A;   CESAREAN SECTION N/A 09/19/2019   Procedure: Repeat CESAREAN SECTION;  Surgeon: Gorge Ade, MD;  Location: [facility];  Service: Obstetrics;  Laterality: N/A;  EDD: 09/26/19 Tracey RNFA   CESAREAN SECTION N/A 12/08/2021   Procedure: Repeat CESAREAN SECTION;  Surgeon: Gorge Ade, MD;  Location: [facility];  Service: Obstetrics;  Laterality: N/A;  EDD: 12/13/21   CESAREAN SECTION WITH BILATERAL TUBAL LIGATION Bilateral 02/21/2023   Procedure: Repeat CESAREAN SECTION;  Surgeon: Gorge Ade, MD;  Location: [facility];  Service: Obstetrics;  Laterality: Bilateral;  EDD: 03/07/23   TONSILLECTOMY AND ADENOIDECTOMY     TUBAL LIGATION Bilateral 02/21/2023   Procedure: BILATERAL TUBAL LIGATION;  Surgeon: Gorge Ade, MD;  Location: [facility];  Service: Obstetrics;  Laterality: Bilateral;   WISDOM TOOTH EXTRACTION      Family History  Problem Relation Age of Onset   Hyperlipidemia Mother    Diabetes Mother    Cancer Mother 63       Uterus,Skin,Thyroid    Heart attack Mother 66   Irritable bowel syndrome Mother    COPD Mother    Heart attack Father    Cancer Maternal Aunt 30       Breast   Diabetes Maternal Grandfather    Breast cancer Maternal Grandmother    ADD / ADHD Brother       Social Connections: Not on file      Current Outpatient Medications:    acetaminophen  (TYLENOL ) 500 MG tablet, Take 2 tablets (1,000 mg total) by mouth every 6 (six) hours., Disp: 30 tablet, Rfl: 0   acetaminophen  (TYLENOL ) 500 MG tablet, Take 2 tablets (1,000 mg total) by mouth every 8 (eight) hours as needed., Disp: 30 tablet, Rfl: 0   cefdinir  (OMNICEF ) 300 MG capsule, Take 1 capsule (300 mg total) by mouth 2 (two) times daily., Disp: 20 capsule, Rfl: 0   esomeprazole (NEXIUM) 20 MG capsule, Take 20 mg by mouth daily as needed (acid reflux). (Patient not taking: Reported on 07/27/2023), Disp: , Rfl:    ibuprofen  (ADVIL ) 600 MG tablet, Take 1 tablet (600 mg total) by mouth every 6 (six) hours., Disp: 30 tablet, Rfl: 0   Physical Exam:   BP 122/79   Pulse 73   Temp 98 F (36.7 C)   Ht 5' 5.5 (1.664 m)   Wt 175 lb (79.4 kg)   SpO2 98%   BMI 28.68 kg/m   Pertinent Findings  CN II-XII intact Left external auditory canal with cerumen impaction, right EAC clear, TM intact with well-pneumatized middle ear space Weber 512: localizes left  Rinne 512: AC > BC b/l  Anterior rhinoscopy: Septum left deviation; bilateral inferior turbinates with no hypertrophy No lesions of oral cavity/oropharynx; dentition within normal limits, absent tonsils No obviously palpable neck masses/lymphadenopathy/thyromegaly No respiratory distress or stridor         Seprately Identifiable Procedures:  None  Impression & Plans:  Erin Goodwin is a 28 y.o. female with the following   Assessment and Plan    Left ear cerumen impaction with associated hearing loss  Cerumen impaction in the left ear causing decreased hearing. Removal expected to improve hearing and alleviate symptoms.  - Order hearing test to assess hearing and middle ear pressure.  Bilateral ear drainage and tinnitus  Bilateral ear drainage and occasional tinnitus. Ears appear healthy with intact eardrums and no visible  drainage.  Possible eustachian tube dysfunction. - Order hearing test to evaluate - Advise to return if drainage recurs for further evaluation.         - f/u phone call discussion with audiological results   Thank you for allowing me the opportunity to care for your patient. Please do not hesitate to contact me should you have any other questions.  Sincerely, Chyrl Cohen PA-C [facility] Phone: [phone] Fax: [phone]  12/28/2023, 2:17 PM

## 2024-02-03 ENCOUNTER — Encounter (INDEPENDENT_AMBULATORY_CARE_PROVIDER_SITE_OTHER): Payer: Self-pay

## 2024-02-09 ENCOUNTER — Encounter (INDEPENDENT_AMBULATORY_CARE_PROVIDER_SITE_OTHER): Payer: Self-pay

## 2024-03-17 ENCOUNTER — Telehealth (INDEPENDENT_AMBULATORY_CARE_PROVIDER_SITE_OTHER): Payer: Self-pay

## 2024-03-17 NOTE — Telephone Encounter (Signed)
Spoke to patient regarding results. Patient understood.

## 2024-03-23 ENCOUNTER — Ambulatory Visit (INDEPENDENT_AMBULATORY_CARE_PROVIDER_SITE_OTHER): Admitting: Physician Assistant

## 2024-03-23 ENCOUNTER — Encounter (INDEPENDENT_AMBULATORY_CARE_PROVIDER_SITE_OTHER): Payer: Self-pay | Admitting: Physician Assistant

## 2024-03-23 VITALS — BP 133/80 | HR 83 | Temp 98.1°F

## 2024-03-23 DIAGNOSIS — H9192 Unspecified hearing loss, left ear: Secondary | ICD-10-CM

## 2024-03-23 DIAGNOSIS — H903 Sensorineural hearing loss, bilateral: Secondary | ICD-10-CM | POA: Diagnosis not present

## 2024-03-23 DIAGNOSIS — R42 Dizziness and giddiness: Secondary | ICD-10-CM | POA: Diagnosis not present

## 2024-03-23 DIAGNOSIS — H9319 Tinnitus, unspecified ear: Secondary | ICD-10-CM | POA: Diagnosis not present

## 2024-03-24 NOTE — Progress Notes (Signed)
 Dear Dr. Severa, Here is my assessment for our mutual patient, Erin Goodwin. Thank you for allowing me the opportunity to care for your patient. Please do not hesitate to contact me should you have any other questions. Sincerely, Chyrl Cohen PA-C  Otolaryngology Clinic Note Referring provider: Dr. Severa HPI:  Erin Goodwin is a 29 y.o. female kindly referred by Dr. Severa   Discussed the use of AI scribe software for clinical note transcription with the patient, who gave verbal consent to proceed.  History of Present Illness   Erin Goodwin is a 29 year old female with bilateral hearing loss who presents for evaluation of tinnitus and episodic dizziness. She was last seen in the office on 12/28/2023. Below is a recap of that encounter.   She has been experiencing hearing loss in her left ear since May, initially accompanied by flu-like symptoms and ear drainage. Despite treatment for an ear infection, her symptoms did not improve. By August, her symptoms seemed to improve, leading her to cancel a scheduled appointment. However, she has been experiencing severe headaches for several weeks, with throbbing in both ears and persistent drainage.   Initially, only the left ear was affected, but now both ears are involved. The headaches are triggered by any level of noise, making them difficult to alleviate. She has attempted various treatments, including chiropractic care, without relief. Ear symptoms include occasional ringing in both ears, described as a high-pitched ring, and occasional clicking or popping sounds.   No history of recurrent ear infections or hearing problems during childhood. She occasionally uses Q-tips around the edge of her ears but denies any significant water  exposure or use of other objects in her ears. She has not sought further medical attention for the drainage since the initial visit.   She is a stay-at-home mother of four children, aged six,  four, ten months, and eleven years. She recently left her job at [employer] to care for her children full-time. No smoking. Tonsils removed in kindergarten.       Update 03/24/2023  Since June or July of last year, she has experienced bilateral hearing loss, subjectively worse on the left. She perceives greater difficulty with her left ear, though recent audiometry revealed symmetric high-frequency sensorineural hearing loss at approximately 6000 Hz bilaterally, with minimal asymmetry. Audiometry showed a slight reduction in word recognition on the left. She has not used hearing aids.  She describes intermittent tinnitus, predominantly in the left ear. Episodes are occasionally accompanied by mild, transient otalgia that requires her to pause briefly. The tinnitus is sometimes associated with dizziness but does not consistently interfere with daily activities.  She experiences episodic dizziness, most notably following her recent hearing screening, during which she developed a severe headache followed by intense vertigo and a sensation of the room spinning upon lying down. The dizziness resolved with rest. She does not identify these episodes as similar to her typical migraines. She does not report specific triggers for her dizziness outside of the hearing test, though she found the background noise during the test overstimulating and uncomfortable.  She is concerned about the progression of her symptoms.        Independent Review of Additional Tests or Records:  Audiological evaluation 03/10/2023          PMH/Meds/All/SocHx/FamHx/ROS:   Past Medical History:  Diagnosis Date   Chronic constipation    Chronic posterior anal fissure    External hemorrhoids    Family history of early CAD  Family history of headaches    GERD (gastroesophageal reflux disease)    Hirsutism    History of chicken pox    Hypotension    Migraines    Palpitations    PCOS (polycystic ovarian syndrome)       Past Surgical History:  Procedure Laterality Date   CESAREAN SECTION N/A 06/01/2017   Procedure: CESAREAN SECTION;  Surgeon: Gorge Ade, MD;  Location: [facility];  Service: Obstetrics;  Laterality: N/A;   CESAREAN SECTION N/A 09/19/2019   Procedure: Repeat CESAREAN SECTION;  Surgeon: Gorge Ade, MD;  [facility]: [facility];  Service: Obstetrics;  Laterality: N/A;  EDD: 09/26/19 Tracey RNFA   CESAREAN SECTION N/A 12/08/2021   Procedure: Repeat CESAREAN SECTION;  Surgeon: Gorge Ade, MD;  [facility]: [facility];  Service: Obstetrics;  Laterality: N/A;  EDD: 12/13/21   CESAREAN SECTION WITH BILATERAL TUBAL LIGATION Bilateral 02/21/2023   Procedure: Repeat CESAREAN SECTION;  Surgeon: Gorge Ade, MD;  [facility];  Service: Obstetrics;  Laterality: Bilateral;  EDD: 03/07/23   TONSILLECTOMY AND ADENOIDECTOMY     TUBAL LIGATION Bilateral 02/21/2023   Procedure: BILATERAL TUBAL LIGATION;  Surgeon: Gorge Ade, MD;  Location: [facility];  Service: Obstetrics;  Laterality: Bilateral;   WISDOM TOOTH EXTRACTION      Family History  Problem Relation Age of Onset   Hyperlipidemia Mother    Diabetes Mother    Cancer Mother 62       Uterus,Skin,Thyroid    Heart attack Mother 82   Irritable bowel syndrome Mother    COPD Mother    Heart attack Father    Cancer Maternal Aunt 30       Breast   Diabetes Maternal Grandfather    Breast cancer Maternal Grandmother    ADD / ADHD Brother      Social Connections: Not on file     Current Medications[1]   Physical Exam:   BP 133/80   Pulse 83   Temp 98.1 F (36.7 C)   SpO2 97%   Pertinent Findings  CN II-XII grossly intact Bilateral EAC clear and TM intact with well pneumatized middle ear spaces  No lesions of oral cavity/oropharynx; dentition wnl No obviously palpable neck masses/lymphadenopathy/thyromegaly No respiratory distress or stridor  Physical Exam  Seprately Identifiable Procedures:   None  Impression & Plans:  Erin Goodwin is a 29 y.o. female with the following   Assessment and Plan    Bilateral sensorineural hearing loss with tinnitus and episodic dizziness Chronic bilateral high-frequency sensorineural hearing loss with no asymmetry and minimal word recognition  difference.  Low suspicion for retrocochlear pathology. Dizziness may be migrainous but atypical for her migraine pattern. Hearing aids not indicated due to mild loss and limited benefit. - discussed that although the suspicion is low given the difference in word recognition scores and related symptoms MRI would provide more information and rule out concerning pathology for her symptoms.   Ordered MRI of the ear with contrast to evaluate for retrocochlear pathology; she may schedule at her discretion. - Advised repeat audiometry in six months if symptoms worsen, or in one year if stable and MRI is not obtained. - Provided anticipatory guidance to report any changes in hearing or new symptoms. - Discussed hearing aids; not recommended currently, but may be reconsidered if tinnitus worsens or hearing loss progresses.           - f/u Phone call with MRI results or audio results    Thank  you for allowing me the opportunity to care for your patient. Please do not hesitate to contact me should you have any other questions.  Sincerely, Chyrl Cohen PA-C [facility] Phone: [phone] Fax: [phone]  03/24/2024, 8:47 AM        [1]  Current Outpatient Medications:    acetaminophen  (TYLENOL ) 500 MG tablet, Take 2 tablets (1,000 mg total) by mouth every 6 (six) hours., Disp: 30 tablet, Rfl: 0   acetaminophen  (TYLENOL ) 500 MG tablet, Take 2 tablets (1,000 mg total) by mouth every 8 (eight) hours as needed., Disp: 30 tablet, Rfl: 0   cefdinir  (OMNICEF ) 300 MG capsule, Take 1 capsule (300 mg total) by mouth 2 (two) times daily., Disp: 20 capsule, Rfl: 0   ibuprofen  (ADVIL ) 600 MG  tablet, Take 1 tablet (600 mg total) by mouth every 6 (six) hours., Disp: 30 tablet, Rfl: 0   esomeprazole (NEXIUM) 20 MG capsule, Take 20 mg by mouth daily as needed (acid reflux). (Patient not taking: Reported on 03/23/2024), Disp: , Rfl:

## 2024-03-29 ENCOUNTER — Encounter: Payer: 59 | Admitting: Family Medicine

## 2024-04-03 ENCOUNTER — Other Ambulatory Visit: Payer: Self-pay

## 2024-04-03 ENCOUNTER — Encounter (HOSPITAL_BASED_OUTPATIENT_CLINIC_OR_DEPARTMENT_OTHER): Payer: Self-pay

## 2024-04-03 ENCOUNTER — Emergency Department (HOSPITAL_BASED_OUTPATIENT_CLINIC_OR_DEPARTMENT_OTHER)

## 2024-04-03 ENCOUNTER — Emergency Department (HOSPITAL_BASED_OUTPATIENT_CLINIC_OR_DEPARTMENT_OTHER): Admission: EM | Admit: 2024-04-03 | Discharge: 2024-04-03 | Disposition: A | Discharge status: Home / Self Care

## 2024-04-03 DIAGNOSIS — R1013 Epigastric pain: Secondary | ICD-10-CM | POA: Insufficient documentation

## 2024-04-03 DIAGNOSIS — R110 Nausea: Secondary | ICD-10-CM | POA: Insufficient documentation

## 2024-04-03 DIAGNOSIS — D72829 Elevated white blood cell count, unspecified: Secondary | ICD-10-CM | POA: Insufficient documentation

## 2024-04-03 LAB — URINALYSIS, ROUTINE W REFLEX MICROSCOPIC
Bilirubin Urine: NEGATIVE
Glucose, UA: NEGATIVE mg/dL
Hgb urine dipstick: NEGATIVE
Ketones, ur: NEGATIVE mg/dL
Leukocytes,Ua: NEGATIVE
Nitrite: NEGATIVE
Protein, ur: NEGATIVE mg/dL
Specific Gravity, Urine: 1.01 (ref 1.005–1.030)
pH: 6.5 (ref 5.0–8.0)

## 2024-04-03 LAB — CBC
HCT: 38.3 % (ref 36.0–46.0)
Hemoglobin: 12.9 g/dL (ref 12.0–15.0)
MCH: 30.6 pg (ref 26.0–34.0)
MCHC: 33.7 g/dL (ref 30.0–36.0)
MCV: 91 fL (ref 80.0–100.0)
Platelets: 360 10*3/uL (ref 150–400)
RBC: 4.21 MIL/uL (ref 3.87–5.11)
RDW: 12.1 % (ref 11.5–15.5)
WBC: 11.1 10*3/uL — ABNORMAL HIGH (ref 4.0–10.5)
nRBC: 0 % (ref 0.0–0.2)

## 2024-04-03 LAB — COMPREHENSIVE METABOLIC PANEL WITH GFR
ALT: 15 U/L (ref 0–44)
AST: 20 U/L (ref 15–41)
Albumin: 4.4 g/dL (ref 3.5–5.0)
Alkaline Phosphatase: 58 U/L (ref 38–126)
Anion gap: 11 (ref 5–15)
BUN: 8 mg/dL (ref 6–20)
CO2: 26 mmol/L (ref 22–32)
Calcium: 10 mg/dL (ref 8.9–10.3)
Chloride: 101 mmol/L (ref 98–111)
Creatinine, Ser: 0.64 mg/dL (ref 0.44–1.00)
GFR, Estimated: >60 mL/min
Glucose, Bld: 80 mg/dL (ref 70–99)
Potassium: 3.5 mmol/L (ref 3.5–5.1)
Sodium: 138 mmol/L (ref 135–145)
Total Bilirubin: 0.3 mg/dL (ref 0.0–1.2)
Total Protein: 7.3 g/dL (ref 6.5–8.1)

## 2024-04-03 LAB — LIPASE, BLOOD: Lipase: 25 U/L (ref 11–51)

## 2024-04-03 LAB — PREGNANCY, URINE: Preg Test, Ur: NEGATIVE

## 2024-04-03 MED ORDER — FAMOTIDINE 20 MG PO TABS: 20.0000 mg | ORAL_TABLET | Freq: Two times a day (BID) | ORAL | 0 refills | Status: AC

## 2024-04-03 MED ORDER — FAMOTIDINE 20 MG PO TABS
20.0000 mg | ORAL_TABLET | Freq: Once | ORAL | Status: AC
  Administered 2024-04-03: 20 mg via ORAL
  Filled 2024-04-03: qty 1

## 2024-04-03 NOTE — ED Provider Notes (Signed)
 " [place] EMERGENCY DEPARTMENT AT [facility] Provider Note   CSN: [id number] Arrival date & time: 04/03/24  2025     Patient presents with: Abdominal Pain   Erin Goodwin is a 29 y.o. female.    Abdominal Pain    Presents because of abdominal pain.  Present over the past 3 to 4 days.  Up until today, she could not appreciate any kind of exacerbating factors.  She states that she did not eat anything today.  Was feeling better.  Subsequently ate chili and about 1 to 2 hours afterward started with worsening abdominal pain.  Epigastric in nature.  Radiates to her back.  Endorses nausea during this episodes.  No vomiting.  She states that she has IBS otherwise her bowel moods been normal for her.  Still passing flatulence.  No blood in her stool that she can appreciate.  History of tubal ligation.  4 C-sections.  Otherwise, no abdominal surgeries.  No significant chest pain.  No pleuritic chest pain.  No hemoptysis.  No history of DVT or PE.  No recent travel history.  No unilateral leg swelling.  No hormone replacement.  No exertional chest pain.  Prior to Admission medications  Medication Sig Start Date End Date Taking? Authorizing Provider  famotidine  (PEPCID ) 20 MG tablet Take 1 tablet (20 mg total) by mouth 2 (two) times daily. 04/03/24  Yes Simon Lavonia SAILOR, MD  acetaminophen  (TYLENOL ) 500 MG tablet Take 2 tablets (1,000 mg total) by mouth every 6 (six) hours. 12/10/21   Paul, Daniela C, CNM  acetaminophen  (TYLENOL ) 500 MG tablet Take 2 tablets (1,000 mg total) by mouth every 8 (eight) hours as needed. 02/23/23   Barbette Knock, MD  cefdinir  (OMNICEF ) 300 MG capsule Take 1 capsule (300 mg total) by mouth 2 (two) times daily. 07/27/23   [facility] Sebastian Pool, NP  esomeprazole (NEXIUM) 20 MG capsule Take 20 mg by mouth daily as needed (acid reflux). Patient not taking: Reported on 03/23/2024    [provider]  ibuprofen  (ADVIL ) 600 MG tablet Take 1 tablet (600 mg  total) by mouth every 6 (six) hours. 02/23/23   Barbette Knock, MD    Allergies: Patient has no known allergies.    Review of Systems  Gastrointestinal:  Positive for abdominal pain.    Updated Vital Signs BP 132/87   Pulse 97   Temp 97.9 F (36.6 C) (Oral)   Resp 15   SpO2 100%   Physical Exam Vitals and nursing note reviewed.  Constitutional:      General: She is not in acute distress.    Appearance: She is well-developed.  HENT:     Head: Normocephalic and atraumatic.  Eyes:     Conjunctiva/sclera: Conjunctivae normal.  Cardiovascular:     Rate and Rhythm: Normal rate and regular rhythm.     Heart sounds: No murmur heard. Pulmonary:     Effort: Pulmonary effort is normal. No respiratory distress.     Breath sounds: Normal breath sounds.  Abdominal:     Palpations: Abdomen is soft.     Tenderness: There is no abdominal tenderness.   Musculoskeletal:        General: No swelling.     Cervical back: Neck supple.  Skin:    General: Skin is warm and dry.     Capillary Refill: Capillary refill takes less than 2 seconds.  Neurological:     Mental Status: She is alert.  Psychiatric:  Mood and Affect: Mood normal.     (all labs ordered are listed, but only abnormal results are displayed) Labs Reviewed  CBC - Abnormal; Notable for the following components:      Result Value   WBC 11.1 (*)    All other components within normal limits  LIPASE, BLOOD  COMPREHENSIVE METABOLIC PANEL WITH GFR  URINALYSIS, ROUTINE W REFLEX MICROSCOPIC  PREGNANCY, URINE    EKG: EKG Interpretation Date/Time:  Monday April 03 2024 20:52:32 EST Ventricular Rate:  72 PR Interval:  150 QRS Duration:  94 QT Interval:  368 QTC Calculation: 403 R Axis:   77  Text Interpretation: Sinus arrhythmia Confirmed by Simon Rea [phone]) on 04/03/2024 10:17:37 PM  Radiology: US  Abdomen Limited RUQ (LIVER/GB) Result Date: 04/03/2024 EXAM: Right Upper Quadrant Abdominal Ultrasound  04/03/2024 10:44:23 PM TECHNIQUE: Real-time ultrasonography of the right upper quadrant of the abdomen was performed. COMPARISON: None available. CLINICAL HISTORY: Abdominal pain. FINDINGS: LIVER: Normal echogenicity. No intrahepatic biliary ductal dilatation. No evidence of mass. Hepatopetal flow in the portal vein. BILIARY SYSTEM: Gallbladder wall thickness measures 0.3 cm. Sonographic Beverley sign is negative. No pericholecystic fluid. No cholelithiasis. Common bile duct is within normal limits measuring 0.4 cm. RIGHT KIDNEY: No hydronephrosis. No echogenic calculi. No mass. PANCREAS: Visualized portions of the pancreas are unremarkable. OTHER: No right upper quadrant ascites. IMPRESSION: 1. No acute findings. Electronically signed by: Greig Pique MD 04/03/2024 10:53 PM EST RP Workstation: [id number]     Procedures   Medications Ordered in the ED  famotidine  (PEPCID ) tablet 20 mg (has no administration in time range)                                    Medical Decision Making Amount and/or Complexity of Data Reviewed Labs: ordered. Radiology: ordered.     HPI:      Presents because of abdominal pain.  Present over the past 3 to 4 days.  Up until today, she could not appreciate any kind of exacerbating factors.  She states that she did not eat anything today.  Was feeling better.  Subsequently ate chili and about 1 to 2 hours afterward started with worsening abdominal pain.  Epigastric in nature.  Radiates to her back.  Endorses nausea during this episodes.  No vomiting.  She states that she has IBS otherwise her bowel moods been normal for her.  Still passing flatulence.  No blood in her stool that she can appreciate.  History of tubal ligation.  4 C-sections.  Otherwise, no abdominal surgeries.  No significant chest pain.  No pleuritic chest pain.  No hemoptysis.  No history of DVT or PE.  No recent travel history.  No unilateral leg swelling.  No hormone replacement.  No exertional chest  pain.  MDM:   Upon examination, patient hemodynamically stable. A&O x 3 with GCS 15.  Concern for cholelithiasis versus cholecystitis versus pancreatitis versus duodenitis versus gastritis.  Will obtain laboratory workup.  Lipase as well as CMP for LFTs.  Obtain right upper quad ultrasound to rule out above including cholecystitis and cholelithiasis.  No concerns for PE.  No chest pain.  No.  Chest pain or hemoptysis.  PERC negative.  No concerns ACS pathology.  Reevaluation:   Upon reexamination, patient hemodynamically stable.  Remains A&O x 3 with GCS 15.  Labs unremarkable.  Slight leukocytosis but lipase normal.  LFTs normal.  Right upper quad  ultrasound negative for cholecystitis or cholelithiasis.  I do think it is reasonable to treat her in terms of GERD versus gastritis.  Started famotidine  20 mg twice daily.  Patient follow-up with PCP.  EKG Interpreted by Me: sinus    Disposition and Follow Up: pcp       Final diagnoses:  Epigastric pain  Nausea    ED Discharge Orders          Ordered    famotidine  (PEPCID ) 20 MG tablet  2 times daily        04/03/24 2303               Simon Lavonia SAILOR, MD 04/03/24 2316  "

## 2024-04-03 NOTE — ED Triage Notes (Signed)
 Pt has had sharp intermittent pain in her diaphragm area that radiates to upper back x 3 days. Pain has progressively worsened with each episode. Pain began approx 1 hour ago after eating and is 7/10. Pt also endorses nausea.

## 2024-04-03 NOTE — Discharge Instructions (Addendum)
 Please take the famotidine  twice daily for the next 30 days.  Please take it 1 hour before breakfast and 1 hour before dinner.  If symptoms worsen experience any kind of chest pain or shortness of breath then please come back to the ED.  I think going forward, eat a very low-fat diet.  Avoid heavy or spicy foods such as chili.  Avoid caffeine alcohol or chocolate.  This can all irritate the stomach lining.  Food such as grilled chicken, plain rice is all easy on your stomach.
